# Patient Record
Sex: Female | Born: 1935 | Race: White | Hispanic: No | Smoking: Former smoker
Health system: Southern US, Community
[De-identification: ages and names within clinical notes are randomized; demographics above are authoritative.]

## PROBLEM LIST (undated history)

## (undated) DIAGNOSIS — F32A Depression, unspecified: Secondary | ICD-10-CM

## (undated) DIAGNOSIS — M1990 Unspecified osteoarthritis, unspecified site: Secondary | ICD-10-CM

## (undated) DIAGNOSIS — F329 Major depressive disorder, single episode, unspecified: Secondary | ICD-10-CM

## (undated) DIAGNOSIS — E785 Hyperlipidemia, unspecified: Secondary | ICD-10-CM

## (undated) DIAGNOSIS — I639 Cerebral infarction, unspecified: Secondary | ICD-10-CM

## (undated) DIAGNOSIS — I2699 Other pulmonary embolism without acute cor pulmonale: Secondary | ICD-10-CM

## (undated) DIAGNOSIS — I350 Nonrheumatic aortic (valve) stenosis: Secondary | ICD-10-CM

## (undated) DIAGNOSIS — I82409 Acute embolism and thrombosis of unspecified deep veins of unspecified lower extremity: Secondary | ICD-10-CM

## (undated) DIAGNOSIS — I10 Essential (primary) hypertension: Secondary | ICD-10-CM

## (undated) DIAGNOSIS — K219 Gastro-esophageal reflux disease without esophagitis: Secondary | ICD-10-CM

## (undated) HISTORY — DX: Hyperlipidemia, unspecified: E78.5

## (undated) HISTORY — DX: Gastro-esophageal reflux disease without esophagitis: K21.9

## (undated) HISTORY — PX: JOINT REPLACEMENT: SHX530

## (undated) HISTORY — DX: Unspecified osteoarthritis, unspecified site: M19.90

## (undated) HISTORY — DX: Major depressive disorder, single episode, unspecified: F32.9

## (undated) HISTORY — PX: ROTATOR CUFF REPAIR: SHX139

## (undated) HISTORY — DX: Depression, unspecified: F32.A

## (undated) HISTORY — DX: Nonrheumatic aortic (valve) stenosis: I35.0

## (undated) HISTORY — PX: FRACTURE SURGERY: SHX138

## (undated) HISTORY — PX: TEMPOROMANDIBULAR JOINT SURGERY: SHX35

## (undated) HISTORY — DX: Essential (primary) hypertension: I10

## (undated) HISTORY — DX: Cerebral infarction, unspecified: I63.9

## (undated) HISTORY — PX: ABDOMINAL HYSTERECTOMY: SHX81

---

## 1998-04-06 ENCOUNTER — Ambulatory Visit (HOSPITAL_COMMUNITY): Admission: RE | Admit: 1998-04-06 | Discharge: 1998-04-06 | Payer: Self-pay | Admitting: Family Medicine

## 1999-04-06 ENCOUNTER — Ambulatory Visit (HOSPITAL_COMMUNITY): Admission: RE | Admit: 1999-04-06 | Discharge: 1999-04-06 | Payer: Self-pay | Admitting: Family Medicine

## 1999-04-06 ENCOUNTER — Encounter: Payer: Self-pay | Admitting: Family Medicine

## 1999-08-12 ENCOUNTER — Emergency Department (HOSPITAL_COMMUNITY): Admission: EM | Admit: 1999-08-12 | Discharge: 1999-08-12 | Payer: Self-pay | Admitting: Emergency Medicine

## 1999-08-31 ENCOUNTER — Ambulatory Visit (HOSPITAL_COMMUNITY): Admission: RE | Admit: 1999-08-31 | Discharge: 1999-08-31 | Payer: Self-pay | Admitting: Gastroenterology

## 1999-08-31 ENCOUNTER — Encounter: Payer: Self-pay | Admitting: Gastroenterology

## 2000-02-01 ENCOUNTER — Ambulatory Visit (HOSPITAL_COMMUNITY): Admission: RE | Admit: 2000-02-01 | Discharge: 2000-02-01 | Payer: Self-pay | Admitting: Gastroenterology

## 2000-02-01 ENCOUNTER — Encounter (INDEPENDENT_AMBULATORY_CARE_PROVIDER_SITE_OTHER): Payer: Self-pay | Admitting: Specialist

## 2000-02-01 ENCOUNTER — Encounter: Payer: Self-pay | Admitting: Gastroenterology

## 2000-02-03 ENCOUNTER — Encounter: Payer: Self-pay | Admitting: Oral Surgery

## 2000-02-08 ENCOUNTER — Inpatient Hospital Stay (HOSPITAL_COMMUNITY): Admission: RE | Admit: 2000-02-08 | Discharge: 2000-02-09 | Payer: Self-pay | Admitting: Oral Surgery

## 2000-02-08 ENCOUNTER — Encounter (INDEPENDENT_AMBULATORY_CARE_PROVIDER_SITE_OTHER): Payer: Self-pay | Admitting: Specialist

## 2000-06-28 ENCOUNTER — Ambulatory Visit (HOSPITAL_COMMUNITY): Admission: RE | Admit: 2000-06-28 | Discharge: 2000-06-28 | Payer: Self-pay | Admitting: Family Medicine

## 2000-06-28 ENCOUNTER — Encounter: Payer: Self-pay | Admitting: Family Medicine

## 2000-09-24 ENCOUNTER — Encounter: Payer: Self-pay | Admitting: Orthopedic Surgery

## 2000-10-01 ENCOUNTER — Inpatient Hospital Stay (HOSPITAL_COMMUNITY): Admission: RE | Admit: 2000-10-01 | Discharge: 2000-10-05 | Payer: Self-pay | Admitting: Orthopedic Surgery

## 2000-10-04 ENCOUNTER — Encounter: Payer: Self-pay | Admitting: Orthopedic Surgery

## 2000-10-05 ENCOUNTER — Inpatient Hospital Stay (HOSPITAL_COMMUNITY)
Admission: RE | Admit: 2000-10-05 | Discharge: 2000-10-12 | Payer: Self-pay | Admitting: Physical Medicine & Rehabilitation

## 2000-10-22 ENCOUNTER — Encounter: Payer: Self-pay | Admitting: Internal Medicine

## 2000-10-22 ENCOUNTER — Inpatient Hospital Stay (HOSPITAL_COMMUNITY): Admission: EM | Admit: 2000-10-22 | Discharge: 2000-10-26 | Payer: Self-pay | Admitting: Emergency Medicine

## 2000-11-16 ENCOUNTER — Inpatient Hospital Stay (HOSPITAL_COMMUNITY): Admission: EM | Admit: 2000-11-16 | Discharge: 2000-12-01 | Payer: Self-pay | Admitting: *Deleted

## 2000-11-16 ENCOUNTER — Encounter (INDEPENDENT_AMBULATORY_CARE_PROVIDER_SITE_OTHER): Payer: Self-pay

## 2000-11-16 ENCOUNTER — Encounter: Payer: Self-pay | Admitting: *Deleted

## 2000-11-19 ENCOUNTER — Encounter: Payer: Self-pay | Admitting: Neurology

## 2000-11-19 ENCOUNTER — Encounter: Payer: Self-pay | Admitting: Family Medicine

## 2000-11-20 ENCOUNTER — Encounter: Payer: Self-pay | Admitting: Neurology

## 2000-11-22 ENCOUNTER — Encounter: Payer: Self-pay | Admitting: Internal Medicine

## 2000-11-23 ENCOUNTER — Encounter: Payer: Self-pay | Admitting: Internal Medicine

## 2001-10-02 ENCOUNTER — Ambulatory Visit (HOSPITAL_COMMUNITY): Admission: RE | Admit: 2001-10-02 | Discharge: 2001-10-02 | Payer: Self-pay | Admitting: Endocrinology

## 2001-10-02 ENCOUNTER — Encounter: Payer: Self-pay | Admitting: Endocrinology

## 2001-11-25 ENCOUNTER — Other Ambulatory Visit: Admission: RE | Admit: 2001-11-25 | Discharge: 2001-11-25 | Payer: Self-pay | Admitting: Internal Medicine

## 2002-10-09 ENCOUNTER — Encounter: Payer: Self-pay | Admitting: Endocrinology

## 2002-10-09 ENCOUNTER — Ambulatory Visit (HOSPITAL_COMMUNITY): Admission: RE | Admit: 2002-10-09 | Discharge: 2002-10-09 | Payer: Self-pay | Admitting: Endocrinology

## 2002-11-13 ENCOUNTER — Other Ambulatory Visit: Admission: RE | Admit: 2002-11-13 | Discharge: 2002-11-13 | Payer: Self-pay | Admitting: Endocrinology

## 2003-10-26 ENCOUNTER — Ambulatory Visit (HOSPITAL_COMMUNITY): Admission: RE | Admit: 2003-10-26 | Discharge: 2003-10-26 | Payer: Self-pay | Admitting: Internal Medicine

## 2004-01-07 ENCOUNTER — Inpatient Hospital Stay (HOSPITAL_COMMUNITY): Admission: RE | Admit: 2004-01-07 | Discharge: 2004-01-12 | Payer: Self-pay | Admitting: Orthopedic Surgery

## 2004-11-02 ENCOUNTER — Ambulatory Visit: Payer: Self-pay | Admitting: Internal Medicine

## 2004-12-30 ENCOUNTER — Ambulatory Visit: Payer: Self-pay | Admitting: Internal Medicine

## 2005-01-04 ENCOUNTER — Ambulatory Visit (HOSPITAL_COMMUNITY): Admission: RE | Admit: 2005-01-04 | Discharge: 2005-01-04 | Payer: Self-pay | Admitting: Internal Medicine

## 2005-02-07 ENCOUNTER — Ambulatory Visit: Payer: Self-pay | Admitting: Internal Medicine

## 2005-02-08 ENCOUNTER — Encounter: Admission: RE | Admit: 2005-02-08 | Discharge: 2005-02-08 | Payer: Self-pay | Admitting: Internal Medicine

## 2005-08-15 ENCOUNTER — Ambulatory Visit: Payer: Self-pay | Admitting: Internal Medicine

## 2005-09-22 ENCOUNTER — Ambulatory Visit (HOSPITAL_BASED_OUTPATIENT_CLINIC_OR_DEPARTMENT_OTHER): Admission: RE | Admit: 2005-09-22 | Discharge: 2005-09-22 | Payer: Self-pay | Admitting: Internal Medicine

## 2005-09-24 ENCOUNTER — Ambulatory Visit: Payer: Self-pay | Admitting: Internal Medicine

## 2005-10-03 ENCOUNTER — Ambulatory Visit: Payer: Self-pay | Admitting: Internal Medicine

## 2005-11-14 ENCOUNTER — Ambulatory Visit: Payer: Self-pay | Admitting: Internal Medicine

## 2005-12-18 ENCOUNTER — Encounter: Admission: RE | Admit: 2005-12-18 | Discharge: 2006-03-18 | Payer: Self-pay | Admitting: Internal Medicine

## 2006-02-04 ENCOUNTER — Encounter: Admission: RE | Admit: 2006-02-04 | Discharge: 2006-02-04 | Payer: Self-pay | Admitting: Orthopedic Surgery

## 2006-04-10 ENCOUNTER — Ambulatory Visit (HOSPITAL_COMMUNITY): Admission: RE | Admit: 2006-04-10 | Discharge: 2006-04-10 | Payer: Self-pay | Admitting: Internal Medicine

## 2006-05-03 ENCOUNTER — Ambulatory Visit (HOSPITAL_COMMUNITY): Admission: RE | Admit: 2006-05-03 | Discharge: 2006-05-03 | Payer: Self-pay | Admitting: Orthopedic Surgery

## 2006-06-28 ENCOUNTER — Ambulatory Visit (HOSPITAL_COMMUNITY): Admission: RE | Admit: 2006-06-28 | Discharge: 2006-06-29 | Payer: Self-pay | Admitting: Orthopedic Surgery

## 2006-07-07 ENCOUNTER — Emergency Department (HOSPITAL_COMMUNITY): Admission: EM | Admit: 2006-07-07 | Discharge: 2006-07-07 | Payer: Self-pay | Admitting: *Deleted

## 2006-07-16 ENCOUNTER — Ambulatory Visit: Payer: Self-pay | Admitting: Internal Medicine

## 2006-08-06 ENCOUNTER — Encounter: Payer: Self-pay | Admitting: Cardiology

## 2006-08-06 ENCOUNTER — Ambulatory Visit: Payer: Self-pay

## 2006-08-14 ENCOUNTER — Ambulatory Visit: Payer: Self-pay | Admitting: Internal Medicine

## 2006-09-11 ENCOUNTER — Ambulatory Visit: Payer: Self-pay | Admitting: Internal Medicine

## 2006-09-17 ENCOUNTER — Ambulatory Visit: Payer: Self-pay | Admitting: Internal Medicine

## 2006-10-15 ENCOUNTER — Ambulatory Visit: Payer: Self-pay | Admitting: Internal Medicine

## 2006-10-15 LAB — CONVERTED CEMR LAB
ALT: 14 units/L (ref 0–40)
AST: 21 units/L (ref 0–37)
Albumin: 3.4 g/dL — ABNORMAL LOW (ref 3.5–5.2)
Alkaline Phosphatase: 66 units/L (ref 39–117)
BUN: 21 mg/dL (ref 6–23)
Bilirubin, Direct: 0.2 mg/dL (ref 0.0–0.3)
CO2: 30 meq/L (ref 19–32)
Calcium: 9.5 mg/dL (ref 8.4–10.5)
Chloride: 102 meq/L (ref 96–112)
Chol/HDL Ratio, serum: 2.4
Cholesterol: 174 mg/dL (ref 0–200)
Creatinine, Ser: 1.3 mg/dL — ABNORMAL HIGH (ref 0.4–1.2)
GFR calc non Af Amer: 43 mL/min
Glomerular Filtration Rate, Af Am: 52 mL/min/{1.73_m2}
Glucose, Bld: 117 mg/dL — ABNORMAL HIGH (ref 70–99)
HDL: 72.8 mg/dL (ref >39.0)
Hgb A1c MFr Bld: 5.7 % (ref 4.6–6.0)
LDL Cholesterol: 78 mg/dL (ref 0–99)
Potassium: 4.3 meq/L (ref 3.5–5.1)
Sodium: 140 meq/L (ref 135–145)
Total Bilirubin: 0.5 mg/dL (ref 0.3–1.2)
Total Protein: 6.2 g/dL (ref 6.0–8.3)
Triglyceride fasting, serum: 118 mg/dL (ref 0–149)
VLDL: 24 mg/dL (ref 0–40)

## 2006-10-21 DIAGNOSIS — K219 Gastro-esophageal reflux disease without esophagitis: Secondary | ICD-10-CM

## 2006-10-21 DIAGNOSIS — Z8679 Personal history of other diseases of the circulatory system: Secondary | ICD-10-CM

## 2006-10-21 DIAGNOSIS — F339 Major depressive disorder, recurrent, unspecified: Secondary | ICD-10-CM

## 2006-10-21 DIAGNOSIS — I10 Essential (primary) hypertension: Secondary | ICD-10-CM | POA: Insufficient documentation

## 2006-10-21 DIAGNOSIS — E785 Hyperlipidemia, unspecified: Secondary | ICD-10-CM

## 2006-11-12 ENCOUNTER — Emergency Department (HOSPITAL_COMMUNITY): Admission: EM | Admit: 2006-11-12 | Discharge: 2006-11-12 | Payer: Self-pay | Admitting: Emergency Medicine

## 2006-11-16 ENCOUNTER — Ambulatory Visit: Payer: Self-pay | Admitting: Internal Medicine

## 2006-12-01 ENCOUNTER — Emergency Department (HOSPITAL_COMMUNITY): Admission: EM | Admit: 2006-12-01 | Discharge: 2006-12-01 | Payer: Self-pay | Admitting: Emergency Medicine

## 2006-12-03 ENCOUNTER — Ambulatory Visit: Payer: Self-pay | Admitting: Internal Medicine

## 2006-12-28 ENCOUNTER — Ambulatory Visit: Payer: Self-pay | Admitting: Internal Medicine

## 2007-02-04 ENCOUNTER — Encounter: Admission: RE | Admit: 2007-02-04 | Discharge: 2007-02-20 | Payer: Self-pay | Admitting: Internal Medicine

## 2007-04-29 ENCOUNTER — Ambulatory Visit (HOSPITAL_COMMUNITY): Admission: RE | Admit: 2007-04-29 | Discharge: 2007-04-29 | Payer: Self-pay | Admitting: Specialist

## 2007-04-29 ENCOUNTER — Encounter (INDEPENDENT_AMBULATORY_CARE_PROVIDER_SITE_OTHER): Payer: Self-pay | Admitting: Specialist

## 2007-07-30 ENCOUNTER — Ambulatory Visit: Payer: Self-pay | Admitting: Internal Medicine

## 2007-07-30 DIAGNOSIS — R42 Dizziness and giddiness: Secondary | ICD-10-CM

## 2007-07-31 ENCOUNTER — Telehealth: Payer: Self-pay | Admitting: Internal Medicine

## 2007-08-14 DIAGNOSIS — M1990 Unspecified osteoarthritis, unspecified site: Secondary | ICD-10-CM

## 2007-08-14 DIAGNOSIS — I359 Nonrheumatic aortic valve disorder, unspecified: Secondary | ICD-10-CM | POA: Insufficient documentation

## 2007-08-21 ENCOUNTER — Ambulatory Visit: Payer: Self-pay | Admitting: Internal Medicine

## 2007-08-21 LAB — CONVERTED CEMR LAB
ALT: 17 units/L (ref 0–35)
AST: 22 units/L (ref 0–37)
Albumin: 4 g/dL (ref 3.5–5.2)
Alkaline Phosphatase: 73 units/L (ref 39–117)
BUN: 18 mg/dL (ref 6–23)
Basophils Absolute: 0.1 10*3/uL (ref 0.0–0.1)
Basophils Relative: 0.9 % (ref 0.0–1.0)
Bilirubin Urine: NEGATIVE
Bilirubin, Direct: 0.1 mg/dL (ref 0.0–0.3)
Blood in Urine, dipstick: NEGATIVE
CO2: 31 meq/L (ref 19–32)
Calcium: 9.9 mg/dL (ref 8.4–10.5)
Chloride: 103 meq/L (ref 96–112)
Cholesterol: 160 mg/dL (ref 0–200)
Creatinine, Ser: 1.2 mg/dL (ref 0.4–1.2)
Creatinine,U: 86.6 mg/dL
Eosinophils Absolute: 0.1 10*3/uL (ref 0.0–0.6)
Eosinophils Relative: 1 % (ref 0.0–5.0)
GFR calc Af Amer: 57 mL/min
GFR calc non Af Amer: 47 mL/min
Glucose, Bld: 119 mg/dL — ABNORMAL HIGH (ref 70–99)
Glucose, Urine, Semiquant: NEGATIVE
HCT: 38.4 % (ref 36.0–46.0)
HDL: 63.1 mg/dL (ref >39.0)
Hemoglobin: 13 g/dL (ref 12.0–15.0)
Hgb A1c MFr Bld: 6 % (ref 4.6–6.0)
Ketones, urine, test strip: NEGATIVE
LDL Cholesterol: 77 mg/dL (ref 0–99)
Lymphocytes Relative: 25.3 % (ref 12.0–46.0)
MCHC: 33.9 g/dL (ref 30.0–36.0)
MCV: 93.6 fL (ref 78.0–100.0)
Microalb Creat Ratio: 9.2 mg/g (ref 0.0–30.0)
Microalb, Ur: 0.8 mg/dL (ref 0.0–1.9)
Monocytes Absolute: 0.5 10*3/uL (ref 0.2–0.7)
Monocytes Relative: 8.5 % (ref 3.0–11.0)
Neutro Abs: 3.6 10*3/uL (ref 1.4–7.7)
Neutrophils Relative %: 64.3 % (ref 43.0–77.0)
Nitrite: NEGATIVE
Platelets: 224 10*3/uL (ref 150–400)
Potassium: 4.9 meq/L (ref 3.5–5.1)
Protein, U semiquant: NEGATIVE
RBC: 4.1 M/uL (ref 3.87–5.11)
RDW: 12.5 % (ref 11.5–14.6)
Sodium: 140 meq/L (ref 135–145)
Specific Gravity, Urine: 1.02
TSH: 1.73 microintl units/mL (ref 0.35–5.50)
Total Bilirubin: 0.6 mg/dL (ref 0.3–1.2)
Total CHOL/HDL Ratio: 2.5
Total Protein: 6.5 g/dL (ref 6.0–8.3)
Triglycerides: 102 mg/dL (ref 0–149)
Urobilinogen, UA: NEGATIVE
VLDL: 20 mg/dL (ref 0–40)
WBC: 5.8 10*3/uL (ref 4.5–10.5)
pH: 5.5

## 2007-08-27 ENCOUNTER — Ambulatory Visit: Payer: Self-pay | Admitting: Internal Medicine

## 2007-08-28 ENCOUNTER — Encounter: Payer: Self-pay | Admitting: Internal Medicine

## 2007-11-01 ENCOUNTER — Ambulatory Visit: Payer: Self-pay | Admitting: Internal Medicine

## 2007-11-01 DIAGNOSIS — F411 Generalized anxiety disorder: Secondary | ICD-10-CM

## 2007-11-11 ENCOUNTER — Telehealth: Payer: Self-pay | Admitting: Internal Medicine

## 2007-12-23 ENCOUNTER — Ambulatory Visit: Payer: Self-pay | Admitting: Internal Medicine

## 2007-12-23 LAB — CONVERTED CEMR LAB
ALT: 17 units/L (ref 0–35)
Alkaline Phosphatase: 65 units/L (ref 39–117)
BUN: 16 mg/dL (ref 6–23)
Bilirubin, Direct: 0.2 mg/dL (ref 0.0–0.3)
Calcium: 9.9 mg/dL (ref 8.4–10.5)
Cholesterol: 166 mg/dL (ref 0–200)
Creatinine, Ser: 1.2 mg/dL (ref 0.4–1.2)
GFR calc Af Amer: 57 mL/min
Glucose, Bld: 125 mg/dL — ABNORMAL HIGH (ref 70–99)
Hgb A1c MFr Bld: 6 % (ref 4.6–6.0)
Potassium: 4.8 meq/L (ref 3.5–5.1)
Total Bilirubin: 0.9 mg/dL (ref 0.3–1.2)
Total CHOL/HDL Ratio: 2.6

## 2007-12-27 ENCOUNTER — Ambulatory Visit: Payer: Self-pay | Admitting: Internal Medicine

## 2008-03-06 ENCOUNTER — Ambulatory Visit: Payer: Self-pay | Admitting: Internal Medicine

## 2008-03-09 ENCOUNTER — Telehealth (INDEPENDENT_AMBULATORY_CARE_PROVIDER_SITE_OTHER): Payer: Self-pay | Admitting: *Deleted

## 2008-03-19 ENCOUNTER — Ambulatory Visit (HOSPITAL_COMMUNITY): Admission: RE | Admit: 2008-03-19 | Discharge: 2008-03-20 | Payer: Self-pay | Admitting: Orthopedic Surgery

## 2008-03-22 ENCOUNTER — Ambulatory Visit: Payer: Self-pay | Admitting: Internal Medicine

## 2008-03-22 ENCOUNTER — Inpatient Hospital Stay (HOSPITAL_COMMUNITY): Admission: EM | Admit: 2008-03-22 | Discharge: 2008-03-24 | Payer: Self-pay | Admitting: Emergency Medicine

## 2008-03-30 ENCOUNTER — Telehealth: Payer: Self-pay | Admitting: Internal Medicine

## 2008-04-07 ENCOUNTER — Ambulatory Visit: Payer: Self-pay | Admitting: Internal Medicine

## 2008-04-09 ENCOUNTER — Encounter: Payer: Self-pay | Admitting: Internal Medicine

## 2008-04-09 ENCOUNTER — Ambulatory Visit: Payer: Self-pay

## 2008-05-25 ENCOUNTER — Ambulatory Visit: Payer: Self-pay | Admitting: Internal Medicine

## 2008-06-09 ENCOUNTER — Ambulatory Visit: Payer: Self-pay | Admitting: Internal Medicine

## 2008-06-15 ENCOUNTER — Encounter: Admission: RE | Admit: 2008-06-15 | Discharge: 2008-06-15 | Payer: Self-pay | Admitting: Internal Medicine

## 2008-06-25 ENCOUNTER — Ambulatory Visit: Payer: Self-pay | Admitting: Internal Medicine

## 2008-06-25 LAB — CONVERTED CEMR LAB
ALT: 20 units/L (ref 0–35)
AST: 23 units/L (ref 0–37)
Albumin: 3.7 g/dL (ref 3.5–5.2)
Calcium: 9.8 mg/dL (ref 8.4–10.5)
Cholesterol: 156 mg/dL (ref 0–200)
GFR calc non Af Amer: 52 mL/min
Hgb A1c MFr Bld: 6.5 % — ABNORMAL HIGH (ref 4.6–6.0)
LDL Cholesterol: 72 mg/dL (ref 0–99)
Potassium: 4.5 meq/L (ref 3.5–5.1)
Total Bilirubin: 0.8 mg/dL (ref 0.3–1.2)
VLDL: 22 mg/dL (ref 0–40)

## 2008-07-07 ENCOUNTER — Telehealth: Payer: Self-pay | Admitting: *Deleted

## 2008-08-04 ENCOUNTER — Telehealth: Payer: Self-pay | Admitting: Internal Medicine

## 2008-08-18 ENCOUNTER — Ambulatory Visit (HOSPITAL_COMMUNITY): Admission: RE | Admit: 2008-08-18 | Discharge: 2008-08-18 | Payer: Self-pay | Admitting: Internal Medicine

## 2008-09-04 ENCOUNTER — Telehealth: Payer: Self-pay | Admitting: Internal Medicine

## 2009-03-25 ENCOUNTER — Telehealth: Payer: Self-pay | Admitting: Internal Medicine

## 2009-05-03 ENCOUNTER — Telehealth: Payer: Self-pay | Admitting: Internal Medicine

## 2009-05-11 LAB — HM DIABETES EYE EXAM: HM Diabetic Eye Exam: NORMAL

## 2009-07-21 ENCOUNTER — Telehealth: Payer: Self-pay | Admitting: Internal Medicine

## 2009-08-11 ENCOUNTER — Ambulatory Visit: Payer: Self-pay | Admitting: Internal Medicine

## 2009-08-12 LAB — CONVERTED CEMR LAB
ALT: 15 units/L (ref 0–35)
AST: 24 units/L (ref 0–37)
BUN: 14 mg/dL (ref 6–23)
Bilirubin, Direct: 0 mg/dL (ref 0.0–0.3)
CO2: 32 meq/L (ref 19–32)
Chloride: 103 meq/L (ref 96–112)
Cholesterol: 133 mg/dL (ref 0–200)
GFR calc non Af Amer: 57.69 mL/min (ref >60)
HDL: 53.5 mg/dL (ref >39.00)
Hgb A1c MFr Bld: 6.1 % (ref 4.6–6.5)
LDL Cholesterol: 56 mg/dL (ref 0–99)
Sodium: 142 meq/L (ref 135–145)
Total CHOL/HDL Ratio: 2

## 2009-08-13 ENCOUNTER — Telehealth: Payer: Self-pay | Admitting: Internal Medicine

## 2009-08-27 ENCOUNTER — Ambulatory Visit (HOSPITAL_COMMUNITY): Admission: RE | Admit: 2009-08-27 | Discharge: 2009-08-27 | Payer: Self-pay | Admitting: Internal Medicine

## 2009-09-01 ENCOUNTER — Encounter: Payer: Self-pay | Admitting: Internal Medicine

## 2009-10-14 ENCOUNTER — Telehealth: Payer: Self-pay | Admitting: Internal Medicine

## 2009-10-18 ENCOUNTER — Encounter (INDEPENDENT_AMBULATORY_CARE_PROVIDER_SITE_OTHER): Payer: Self-pay | Admitting: *Deleted

## 2009-10-29 ENCOUNTER — Telehealth: Payer: Self-pay | Admitting: Internal Medicine

## 2009-11-30 ENCOUNTER — Telehealth: Payer: Self-pay | Admitting: Internal Medicine

## 2010-01-06 ENCOUNTER — Telehealth: Payer: Self-pay | Admitting: Internal Medicine

## 2010-02-28 ENCOUNTER — Telehealth: Payer: Self-pay | Admitting: Internal Medicine

## 2010-04-13 ENCOUNTER — Telehealth: Payer: Self-pay | Admitting: Internal Medicine

## 2010-08-08 ENCOUNTER — Ambulatory Visit: Payer: Self-pay | Admitting: Internal Medicine

## 2010-08-11 LAB — CONVERTED CEMR LAB
ALT: 16 units/L (ref 0–35)
BUN: 12 mg/dL (ref 6–23)
CO2: 32 meq/L (ref 19–32)
Calcium: 10.5 mg/dL (ref 8.4–10.5)
Glucose, Bld: 208 mg/dL — ABNORMAL HIGH (ref 70–99)
HDL: 49.5 mg/dL (ref >39.00)
Microalb Creat Ratio: 3.5 mg/g (ref 0.0–30.0)
Sodium: 139 meq/L (ref 135–145)
Total CHOL/HDL Ratio: 3

## 2010-08-29 ENCOUNTER — Telehealth: Payer: Self-pay | Admitting: Internal Medicine

## 2010-08-31 ENCOUNTER — Ambulatory Visit (HOSPITAL_COMMUNITY): Admission: RE | Admit: 2010-08-31 | Discharge: 2010-08-31 | Payer: Self-pay | Admitting: Internal Medicine

## 2010-09-19 ENCOUNTER — Telehealth: Payer: Self-pay | Admitting: Internal Medicine

## 2010-10-03 ENCOUNTER — Ambulatory Visit: Payer: Self-pay | Admitting: Internal Medicine

## 2010-11-16 ENCOUNTER — Ambulatory Visit: Payer: Self-pay | Admitting: Internal Medicine

## 2010-12-28 ENCOUNTER — Other Ambulatory Visit: Payer: Self-pay | Admitting: Internal Medicine

## 2010-12-28 ENCOUNTER — Ambulatory Visit
Admission: RE | Admit: 2010-12-28 | Discharge: 2010-12-28 | Payer: Self-pay | Source: Home / Self Care | Attending: Internal Medicine | Admitting: Internal Medicine

## 2010-12-28 LAB — HEMOGLOBIN A1C: Hgb A1c MFr Bld: 6.5 % (ref 4.6–6.5)

## 2010-12-28 LAB — LIPID PANEL
Cholesterol: 136 mg/dL (ref 0–200)
HDL: 48.2 mg/dL (ref >39.00)
LDL Cholesterol: 66 mg/dL (ref 0–99)
Total CHOL/HDL Ratio: 3
Triglycerides: 108 mg/dL (ref 0.0–149.0)
VLDL: 21.6 mg/dL (ref 0.0–40.0)

## 2010-12-28 LAB — BASIC METABOLIC PANEL
BUN: 16 mg/dL (ref 6–23)
CO2: 30 mEq/L (ref 19–32)
Calcium: 9.9 mg/dL (ref 8.4–10.5)
Chloride: 102 mEq/L (ref 96–112)
Creatinine, Ser: 1.5 mg/dL — ABNORMAL HIGH (ref 0.4–1.2)
GFR: 35.45 mL/min — ABNORMAL LOW (ref >60.00)
Glucose, Bld: 139 mg/dL — ABNORMAL HIGH (ref 70–99)
Potassium: 4.9 mEq/L (ref 3.5–5.1)
Sodium: 139 mEq/L (ref 135–145)

## 2010-12-28 LAB — HEPATIC FUNCTION PANEL
ALT: 19 U/L (ref 0–35)
AST: 23 U/L (ref 0–37)
Albumin: 4 g/dL (ref 3.5–5.2)
Alkaline Phosphatase: 68 U/L (ref 39–117)
Bilirubin, Direct: 0.2 mg/dL (ref 0.0–0.3)
Total Bilirubin: 0.8 mg/dL (ref 0.3–1.2)
Total Protein: 6.7 g/dL (ref 6.0–8.3)

## 2011-01-01 ENCOUNTER — Encounter: Payer: Self-pay | Admitting: Internal Medicine

## 2011-01-02 ENCOUNTER — Ambulatory Visit
Admission: RE | Admit: 2011-01-02 | Discharge: 2011-01-02 | Payer: Self-pay | Source: Home / Self Care | Attending: Internal Medicine | Admitting: Internal Medicine

## 2011-01-07 ENCOUNTER — Encounter: Payer: Self-pay | Admitting: Internal Medicine

## 2011-01-10 NOTE — Assessment & Plan Note (Signed)
Summary: 1 MONTH FUP/CJR   Vital Signs:  Patient profile:   77 year old female Weight:      211 pounds Temp:     98.5 degrees F oral Pulse rate:   92 / minute BP sitting:   128 / 78  (left arm) Cuff size:   regular  Vitals Entered By: Alfred Levins, CMA (October 03, 2010 11:53 AM) CC: f/u   CC:  f/u.  History of Present Illness: she feels much better tolerating meds without difficulty  Follow-Up Visit      This is a 77 year old woman who presents for Follow-up visit.  The patient denies chest pain and palpitations.  Since the last visit the patient notes no new problems or concerns.  The patient reports taking meds as prescribed.  When questioned about possible medication side effects, the patient notes none.    Current Problems (verified): 1)  Other Anxiety States  (ICD-300.09) 2)  Physical Examination  (ICD-V70.0) 3)  Aortic Stenosis, Mild  (ICD-424.1) 4)  Osteoarthritis  (ICD-715.90) 5)  Vertigo  (ICD-780.4) 6)  Cerebrovascular Accident, Hx of  (ICD-V12.50) 7)  Hypertension  (ICD-401.9) 8)  Hyperlipidemia  (ICD-272.4) 9)  Gerd  (ICD-530.81) 10)  Diabetes Mellitus, Type II  (ICD-250.00) 11)  Depression  (ICD-311)  Current Medications (verified): 1)  Furosemide 20 Mg Tabs (Furosemide) .... Take 1 Capsule By Mouth Once A Day 2)  Imipramine Hcl 25 Mg Tabs (Imipramine Hcl) .... Take 1 Tablet By Mouth Once A Day 3)  Labetalol Hcl 300 Mg Tabs (Labetalol Hcl) .... Take 1 Tablet By Mouth Once A Day 4)  Nexium 40 Mg Cpdr (Esomeprazole Magnesium) .... Take 1 Capsule By Mouth Once A Day 5)  Simvastatin 20 Mg Tabs (Simvastatin) .... Take 1 Tablet By Mouth At Bedtime 6)  Verelan Pm 300 Mg Cp24 (Verapamil Hcl) .... Take 1 Capsule By Mouth Once A Day 7)  Klor-Con M20 20 Meq  Tbcr (Potassium Chloride Crys Cr) .... Take 1 Tablet By Mouth Once A Day 8)  Multivitamins   Tabs (Multiple Vitamin) .... One By Mouth Daily 9)  Aspirin 325 Mg  Tbec (Aspirin) .... Once Daily 10)   Vitamin B-12 Cr 1000 Mcg  Tbcr (Cyanocobalamin) .... Once Daily 11)  Nova Max Glucose Test  Strp (Glucose Blood) .... Use Daily As Directed 12)  Citalopram Hydrobromide 20 Mg Tabs (Citalopram Hydrobromide) .... Take One Tablet By Mouth Daily  Allergies (verified): 1)  ! Hydrochlorothiazide (Hydrochlorothiazide) 2)  Micardis Hct (Telmisartan-Hctz) 3)  Altace (Ramipril) 4)  * Tape  Past History:  Past Medical History: Last updated: 08/14/2007 Depression Diabetes mellitus, type II GERD Hyperlipidemia Hypertension Cerebrovascular accident, hx of aortic stenosis, mild Osteoarthritis  Past Surgical History: Last updated: 08/14/2007 Hysterectomy, fibroids-no ca Total knee replacement, R and L rotator cuff surgery TMJ surgery L leg fx  Social History: Last updated: 07/30/2007 Married Alcohol use-no Regular exercise-no  Risk Factors: Exercise: no (07/30/2007)  Risk Factors: Smoking Status: quit (08/08/2010)  Physical Exam  General:  alert and well-developed.   Head:  normocephalic and atraumatic.   Ears:  R ear normal and L ear normal.   Neck:  No deformities, masses, or tenderness noted. Chest Wall:  No deformities, masses, or tenderness noted. Lungs:  normal respiratory effort and no intercostal retractions.   Heart:  normal rate and regular rhythm.   Abdomen:  soft and non-tender.   Skin:  turgor normal and color normal.   Cervical Nodes:  no  anterior cervical adenopathy and no posterior cervical adenopathy.     Impression & Recommendations:  Problem # 1:  OTHER ANXIETY STATES (ICD-300.09) tolerating meds  continue same meds see me 4 months Her updated medication list for this problem includes:    Imipramine Hcl 25 Mg Tabs (Imipramine hcl) .Marland Kitchen... Take 1 tablet by mouth once a day    Citalopram Hydrobromide 20 Mg Tabs (Citalopram hydrobromide) .Marland Kitchen... Take one tablet by mouth daily  Problem # 2:  HYPERTENSION (ICD-401.9) controlled continue current  medications  Her updated medication list for this problem includes:    Furosemide 20 Mg Tabs (Furosemide) .Marland Kitchen... Take 1 capsule by mouth once a day    Labetalol Hcl 300 Mg Tabs (Labetalol hcl) .Marland Kitchen... Take 1 tablet by mouth once a day    Verelan Pm 300 Mg Cp24 (Verapamil hcl) .Marland Kitchen... Take 1 capsule by mouth once a day  BP today: 128/78 Prior BP: 102/66 (08/08/2010)  Labs Reviewed: K+: 4.9 (08/08/2010) Creat: : 1.4 (08/08/2010)   Chol: 145 (08/08/2010)   HDL: 49.50 (08/08/2010)   LDL: 58 (08/08/2010)   TG: 189.0 (08/08/2010)  Problem # 3:  HYPERLIPIDEMIA (ICD-272.4) controlled continue current medications  Her updated medication list for this problem includes:    Simvastatin 20 Mg Tabs (Simvastatin) .Marland Kitchen... Take 1 tablet by mouth at bedtime  Labs Reviewed: SGOT: 24 (08/11/2009)   SGPT: 16 (08/08/2010)   HDL:49.50 (08/08/2010), 53.50 (08/11/2009)  LDL:58 (08/08/2010), 56 (08/11/2009)  Chol:145 (08/08/2010), 133 (08/11/2009)  Trig:189.0 (08/08/2010), 120.0 (08/11/2009)  Complete Medication List: 1)  Furosemide 20 Mg Tabs (Furosemide) .... Take 1 capsule by mouth once a day 2)  Imipramine Hcl 25 Mg Tabs (Imipramine hcl) .... Take 1 tablet by mouth once a day 3)  Labetalol Hcl 300 Mg Tabs (Labetalol hcl) .... Take 1 tablet by mouth once a day 4)  Nexium 40 Mg Cpdr (Esomeprazole magnesium) .... Take 1 capsule by mouth once a day 5)  Simvastatin 20 Mg Tabs (Simvastatin) .... Take 1 tablet by mouth at bedtime 6)  Verelan Pm 300 Mg Cp24 (Verapamil hcl) .... Take 1 capsule by mouth once a day 7)  Klor-con M20 20 Meq Tbcr (Potassium chloride crys cr) .... Take 1 tablet by mouth once a day 8)  Multivitamins Tabs (Multiple vitamin) .... One by mouth daily 9)  Aspirin 325 Mg Tbec (Aspirin) .... Once daily 10)  Vitamin B-12 Cr 1000 Mcg Tbcr (Cyanocobalamin) .... Once daily 11)  Nova Max Glucose Test Strp (Glucose blood) .... Use daily as directed 12)  Citalopram Hydrobromide 20 Mg Tabs (Citalopram  hydrobromide) .... Take one tablet by mouth daily  Other Orders: Orthopedic Referral (Ortho)  Patient Instructions: 1)  Please schedule a follow-up appointment in 4 months. 2)  labs one week prior to visit 3)  lipids---272.4 4)  lfts-995.2 5)  bmet-995.2 6)  A1C-250.02 7)     Prescriptions: CITALOPRAM HYDROBROMIDE 20 MG TABS (CITALOPRAM HYDROBROMIDE) take one tablet by mouth daily  #90 x 3   Entered and Authorized by:   Birdie Sons MD   Signed by:   Birdie Sons MD on 10/03/2010   Method used:   [facility]        [facility] [phone]* (retail)       [address]       [place], [place]  [address]       Ph: [phone]       Fax: [phone]  RxID:   [id number] KLOR-CON M20 20 MEQ  TBCR (POTASSIUM CHLORIDE CRYS CR) Take 1 tablet by mouth once a day  #90 x 3   Entered and Authorized by:   Birdie Sons MD   Signed by:   Birdie Sons MD on 10/03/2010   Method used:   [facility]        [facility] [address] [phone]* (retail)       [address]       [place], [place]  [address]       Ph: [phone]       Fax: [phone]   RxID:   [id number] VERELAN PM 300 MG CP24 (VERAPAMIL HCL) Take 1 capsule by mouth once a day  #90 x 3   Entered and Authorized by:   Birdie Sons MD   Signed by:   Birdie Sons MD on 10/03/2010   Method used:   [facility]        [facility] [address] [phone]* (retail)       [address]       [place], [place]  [address]       Ph: [phone]       Fax: [phone]   RxID:   [id number] SIMVASTATIN 20 MG TABS (SIMVASTATIN) Take 1 tablet by mouth at bedtime  #90 x 3   Entered and Authorized by:   Birdie Sons MD   Signed by:   Birdie Sons MD on 10/03/2010   Method used:   [facility]ically [facility]        [facility] [phone]* (retail)       [address]       [place], [place]  [address]       Ph: [phone]       Fax:  [phone]   RxID:   [id number] NEXIUM 40 MG CPDR (ESOMEPRAZOLE MAGNESIUM) Take 1 capsule by mouth once a day  #90 x 3   Entered and Authorized by:   Birdie Sons MD   Signed by:   Birdie Sons MD on 10/03/2010   Method used:   [facility]        [facility] [address] [phone]* (retail)       [address]       [place], [place]  [address]       Ph: [phone]       Fax: [phone]   RxID:   [id number] LABETALOL HCL 300 MG TABS (LABETALOL HCL) Take 1 tablet by mouth once a day  #90 x 3   Entered and Authorized by:   Birdie Sons MD   Signed by:   Birdie Sons MD on 10/03/2010   Method used:   [facility]        [facility] [phone]* (retail)       [address]       [place], [place]  [address]       Ph: [phone]       Fax: [phone]   RxID:   [id number] IMIPRAMINE HCL 25 MG TABS (IMIPRAMINE HCL) Take 1 tablet by mouth once a day  #90 x 3   Entered and Authorized by:   Birdie Sons MD  Signed by:   Birdie Sons MD on 10/03/2010   Method used:   [facility]        [facility] [address] #[phone]* (retail)       [address]       [place], [place]  [address]       Ph: [phone]       Fax: [phone]   RxID:   [id number] FUROSEMIDE 20 MG TABS (FUROSEMIDE) Take 1 capsule by mouth once a day  #90 x 3   Entered and Authorized by:   Birdie Sons MD   Signed by:   Birdie Sons MD on 10/03/2010   Method used:   [facility]        [facility] [phone]* (retail)       [address]       [place], [place]  [address]       Ph: [phone]       Fax: [phone]   RxID:   [id number]    Orders Added: 1)  Est. Patient Level IV [[address]] 2)  Orthopedic Referral [Ortho]

## 2011-01-10 NOTE — Progress Notes (Signed)
Summary: labetalol,simvastatin,imipramine  Phone Note Call from Patient Call back at Home Phone [phone]   Caller: Patient Call For: Birdie Sons MD Summary of Call: Please  call [facility] pt needs labetalol 300 MG, simvastatin 20 mg and impramine 25 mg new rx's Initial call taken by: Heron Sabins,  January 06, 2010 3:52 PM  Follow-up for Phone Call        see Rx    Prescriptions: SIMVASTATIN 20 MG TABS (SIMVASTATIN) Take 1 tablet by mouth at bedtime  #90 x 3   Entered by:   Gladis Riffle, RN   Authorized by:   Birdie Sons MD   Signed by:   Gladis Riffle, RN on 01/06/2010   Method used:   [facility]        [facility] [phone]* (retail)       [address]       [place], [place]  [address]       Ph: [phone]       Fax: [phone]   RxID:   [id number] LABETALOL HCL 300 MG TABS (LABETALOL HCL) Take 1 tablet by mouth twice a day  #180 x 3   Entered by:   Gladis Riffle, RN   Authorized by:   Birdie Sons MD   Signed by:   Gladis Riffle, RN on 01/06/2010   Method used:   [facility]        [facility] [phone]* (retail)       [address]       [place], [place]  [address]       Ph: [phone]       Fax: [phone]   RxID:   [id number] IMIPRAMINE HCL 25 MG TABS (IMIPRAMINE HCL) Take 1 tablet by mouth once a day  #90 x 3   Entered by:   Gladis Riffle, RN   Authorized by:   Birdie Sons MD   Signed by:   Gladis Riffle, RN on 01/06/2010   Method used:   [facility]        [facility] [phone]* (retail)       [address]       [place], [place]  [address]       Ph: [phone]       Fax: [phone]   RxID:   [id number]

## 2011-01-10 NOTE — Progress Notes (Signed)
Summary: furosemide  Phone Note Refill Request Message from:  Fax from Pharmacy on August 29, 2010 3:45 PM  Refills Requested: Medication #1:  FUROSEMIDE 20 MG TABS Take 1 capsule by mouth once a day Initial call taken by: Kern Reap CMA Duncan Dull),  August 29, 2010 3:45 PM    Prescriptions: FUROSEMIDE 20 MG TABS (FUROSEMIDE) Take 1 capsule by mouth once a day  #90 x 1   Entered by:   Kern Reap CMA (AAMA)   Authorized by:   Birdie Sons MD   Signed by:   Kern Reap CMA (AAMA) on 08/29/2010   Method used:   [facility]        [facility] [phone]* (retail)       [address]       [place], [place]  [address]       Ph: [phone]       Fax: [phone]   RxID:   [id number]

## 2011-01-10 NOTE — Progress Notes (Signed)
Summary: REQ FOR RX (Effexor)  Phone Note Call from Patient   Caller: Patient [phone] Reason for Call: Refill Medication Summary of Call: Pt called to req that a refill on med: Effexor Xr 75 Mg   [facility] Initial call taken by: Debbra Riding,  February 28, 2010 3:09 PM    Prescriptions: EFFEXOR XR 75 MG CP24 (VENLAFAXINE HCL) 2 by mouth once daily  #180 x 3   Entered by:   Kern Reap CMA (AAMA)   Authorized by:   Birdie Sons MD   Signed by:   Kern Reap CMA (AAMA) on 03/01/2010   Method used:   [facility]        [facility] [phone]* (retail)       [address]       [place], [place]  [address]       Ph: [phone]       Fax: [phone]   RxID:   [id number]

## 2011-01-10 NOTE — Assessment & Plan Note (Signed)
Summary: fu on dm/njr---PT [facility] // RS   Vital Signs:  Patient profile:   77 year old female Weight:      210 pounds Temp:     98.5 degrees F oral Pulse rate:   84 / minute Pulse rhythm:   regular Resp:     16 per minute BP sitting:   102 / 66  (left arm) Cuff size:   regular  Vitals Entered By: Gladis Riffle, RN (August 08, 2010 9:49 AM) CC: FU DM--CBGs not done at home--c/o dizzy spells at times that are relieved with food Is Patient Diabetic? Yes Did you bring your meter with you today? No   CC:  FU DM--CBGs not done at home--c/o dizzy spells at times that are relieved with food.  History of Present Illness: Depression:  Since husband died she stays in her home "all i do is eat, sleep and read". I don't care about things  noncompliant with f/u dm---CBGs elevated at home  lipids--tolerating meds  htn--taking meds  GERD---not taking nexium---having recurrent sxs  All other systems reviewed and were negative   Preventive Screening-Counseling & Management  Alcohol-Tobacco     Smoking Status: quit  Current Problems (verified): 1)  Other Anxiety States  (ICD-300.09) 2)  Physical Examination  (ICD-V70.0) 3)  Aortic Stenosis, Mild  (ICD-424.1) 4)  Osteoarthritis  (ICD-715.90) 5)  Vertigo  (ICD-780.4) 6)  Cerebrovascular Accident, Hx of  (ICD-V12.50) 7)  Hypertension  (ICD-401.9) 8)  Hyperlipidemia  (ICD-272.4) 9)  Gerd  (ICD-530.81) 10)  Diabetes Mellitus, Type II  (ICD-250.00) 11)  Depression  (ICD-311)  Current Medications (verified): 1)  Effexor Xr 75 Mg Cp24 (Venlafaxine Hcl) .... Take 1 Tablet By Mouth Once A Day 2)  Furosemide 20 Mg Tabs (Furosemide) .... Take 1 Capsule By Mouth Once A Day 3)  Imipramine Hcl 25 Mg Tabs (Imipramine Hcl) .... Take 1 Tablet By Mouth Once A Day 4)  Labetalol Hcl 300 Mg Tabs (Labetalol Hcl) .... Take 1 Tablet By Mouth Twice A Day 5)  Nexium 40 Mg Cpdr (Esomeprazole Magnesium) .... Take 1 Capsule By Mouth Once A Day 6)   Simvastatin 20 Mg Tabs (Simvastatin) .... Take 1 Tablet By Mouth At Bedtime 7)  Verelan Pm 300 Mg Cp24 (Verapamil Hcl) .... Take 1 Capsule By Mouth Once A Day 8)  Klor-Con M20 20 Meq  Tbcr (Potassium Chloride Crys Cr) .... Take 1 Tablet By Mouth Once A Day 9)  Multivitamins   Tabs (Multiple Vitamin) .... One By Mouth Daily 10)  Aspirin 325 Mg  Tbec (Aspirin) .... Once Daily 11)  Vitamin B-12 Cr 1000 Mcg  Tbcr (Cyanocobalamin) .... Once Daily 12)  Nova Max Glucose Test  Strp (Glucose Blood) .... Use Daily As Directed  Allergies: 1)  ! Hydrochlorothiazide (Hydrochlorothiazide) 2)  Micardis Hct (Telmisartan-Hctz) 3)  Altace (Ramipril) 4)  * Tape  Past History:  Past Medical History: Last updated: 08/14/2007 Depression Diabetes mellitus, type II GERD Hyperlipidemia Hypertension Cerebrovascular accident, hx of aortic stenosis, mild Osteoarthritis  Past Surgical History: Last updated: 08/14/2007 Hysterectomy, fibroids-no ca Total knee replacement, R and L rotator cuff surgery TMJ surgery L leg fx  Social History: Last updated: 07/30/2007 Married Alcohol use-no Regular exercise-no  Risk Factors: Exercise: no (07/30/2007)  Risk Factors: Smoking Status: quit (08/08/2010)  Physical Exam  General:  alert and well-developed.   Head:  normocephalic and atraumatic.   Eyes:  pupils equal and pupils round.   Ears:  R ear normal and L ear  normal.   Neck:  No deformities, masses, or tenderness noted. Lungs:  normal respiratory effort and no intercostal retractions.   Heart:  normal rate and regular rhythm.   Abdomen:  Bowel sounds positive,abdomen soft and non-tender without masses, organomegaly or hernias noted.  Msk:  No deformity or scoliosis noted of thoracic or lumbar spine.   Neurologic:  cranial nerves II-XII intact and gait normal.   Skin:  turgor normal and color normal.   Cervical Nodes:  no anterior cervical adenopathy and no posterior cervical adenopathy.     Psych:  good eye contact and flat affect.     Impression & Recommendations:  Problem # 1:  DEPRESSION (ICD-311) poor control she is not taking effexor as prescribed---side effects of being tired change to citalopram she denies suicidal thoughts Her updated medication list for this problem includes:    Effexor Xr 75 Mg Cp24 (Venlafaxine hcl) .Marland Kitchen... Take 1 tablet by mouth once a day    Imipramine Hcl 25 Mg Tabs (Imipramine hcl) .Marland Kitchen... Take 1 tablet by mouth once a day    Citalopram Hydrobromide 20 Mg Tabs (Citalopram hydrobromide) .Marland Kitchen... Take one tablet by mouth daily  Problem # 2:  AORTIC STENOSIS, MILD (ICD-424.1) note she has orthostatic sxs at times note BP is low decrease labetolol Her updated medication list for this problem includes:    Labetalol Hcl 300 Mg Tabs (Labetalol hcl) .Marland Kitchen... Take 1 tablet by mouth once a day    Aspirin 325 Mg Tbec (Aspirin) ..... Once daily  Problem # 3:  DIABETES MELLITUS, TYPE II (ICD-250.00)  no meds needs f/u check labs today Her updated medication list for this problem includes:    Aspirin 325 Mg Tbec (Aspirin) ..... Once daily  Labs Reviewed: Creat: 1.0 (08/11/2009)     Last Eye Exam: normal-pt's report (05/11/2009) Reviewed HgBA1c results: 6.1 (08/11/2009)  6.5 (06/25/2008)  Orders: TLB-A1C / Hgb A1C (Glycohemoglobin) (83036-A1C) TLB-BMP (Basic Metabolic Panel-BMET) (80048-METABOL) TLB-Lipid Panel (80061-LIPID) TLB-ALT (SGPT) (84460-ALT) Venipuncture (40981) TLB-Microalbumin/Creat Ratio, Urine (82043-MALB)  Problem # 4:  HYPERLIPIDEMIA (ICD-272.4)  Her updated medication list for this problem includes:    Simvastatin 20 Mg Tabs (Simvastatin) .Marland Kitchen... Take 1 tablet by mouth at bedtime  Labs Reviewed: SGOT: 24 (08/11/2009)   SGPT: 15 (08/11/2009)   HDL:53.50 (08/11/2009), 62.5 (06/25/2008)  LDL:56 (08/11/2009), 72 (06/25/2008)  Chol:133 (08/11/2009), 156 (06/25/2008)  Trig:120.0 (08/11/2009), 108  (06/25/2008)  Orders: Venipuncture ([address]) TLB-Microalbumin/Creat Ratio, Urine (82043-MALB)  Problem # 5:  GERD (ICD-530.81) she will check into protonix (might be cheaper) Her updated medication list for this problem includes:    Nexium 40 Mg Cpdr (Esomeprazole magnesium) .Marland Kitchen... Take 1 capsule by mouth once a day  Complete Medication List: 1)  Effexor Xr 75 Mg Cp24 (Venlafaxine hcl) .... Take 1 tablet by mouth once a day 2)  Furosemide 20 Mg Tabs (Furosemide) .... Take 1 capsule by mouth once a day 3)  Imipramine Hcl 25 Mg Tabs (Imipramine hcl) .... Take 1 tablet by mouth once a day 4)  Labetalol Hcl 300 Mg Tabs (Labetalol hcl) .... Take 1 tablet by mouth once a day 5)  Nexium 40 Mg Cpdr (Esomeprazole magnesium) .... Take 1 capsule by mouth once a day 6)  Simvastatin 20 Mg Tabs (Simvastatin) .... Take 1 tablet by mouth at bedtime 7)  Verelan Pm 300 Mg Cp24 (Verapamil hcl) .... Take 1 capsule by mouth once a day 8)  Klor-con M20 20 Meq Tbcr (Potassium chloride crys cr) .... Take 1 tablet  by mouth once a day 9)  Multivitamins Tabs (Multiple vitamin) .... One by mouth daily 10)  Aspirin 325 Mg Tbec (Aspirin) .... Once daily 11)  Vitamin B-12 Cr 1000 Mcg Tbcr (Cyanocobalamin) .... Once daily 12)  Nova Max Glucose Test Strp (Glucose blood) .... Use daily as directed 13)  Citalopram Hydrobromide 20 Mg Tabs (Citalopram hydrobromide) .... Take one tablet by mouth daily  Patient Instructions: 1)  Please schedule a follow-up appointment in 1 month. 2)  Check your blood sugars regularly. If your readings are usually above : or below 70 you should contact our office. 3)  See your eye doctor yearly to check for diabetic eye damage. 4)  Check your feet each night for sore areas, calluses or signs of infection. 5)    Prescriptions: CITALOPRAM HYDROBROMIDE 20 MG TABS (CITALOPRAM HYDROBROMIDE) take one tablet by mouth daily  #90 x 3   Entered and Authorized by:   Birdie Sons MD   Signed by:    Birdie Sons MD on 08/08/2010   Method used:   [facility] to        [facility] [phone]* (retail)       [address]       [place], [place]  [address]       Ph: [phone]       Fax: [phone]   RxID:   [id number] NOVA MAX GLUCOSE TEST  STRP (GLUCOSE BLOOD) use daily as directed  #100 x 3   Entered by:   Gladis Riffle, RN   Authorized by:   Birdie Sons MD   Signed by:   Gladis Riffle, RN on 08/08/2010   Method used:   [facility]        [facility] [phone]* (retail)       [address]       [place], [place]  [address]       Ph: [phone]       Fax: [phone]   RxID:   [id number]   Appended Document: fu on dm/njr---PT [facility] // RS   Appended Document: Orders Update     Clinical Lists Changes  Orders: Added new Service order of Specimen Handling ([id number]) - Signed

## 2011-01-10 NOTE — Progress Notes (Signed)
Summary: Refill Request  Phone Note Refill Request Message from:  Patient on September 19, 2010 4:58 PM  Refills Requested: Medication #1:  SIMVASTATIN 20 MG TABS Take 1 tablet by mouth at bedtime   Dosage confirmed as above?Dosage Confirmed   Brand Name Necessary? No   Supply Requested: 3 months   Notes: Insurance will only pay for 90-day supply [facility] [address]   Next Appointment Scheduled: 10.18.11 Initial call taken by: Harold Barban,  September 19, 2010 4:58 PM    Prescriptions: SIMVASTATIN 20 MG TABS (SIMVASTATIN) Take 1 tablet by mouth at bedtime  #90 x 0   Entered by:   Sid Falcon LPN   Authorized by:   Birdie Sons MD   Signed by:   Sid Falcon LPN on 18/84/1660   Method used:   [facility]ronically to        [facility]  [address] [phone]* (retail)       [address]       [place], [place]  [address]       Ph: [phone]       Fax: [phone]   RxID:   [id number]

## 2011-01-10 NOTE — Progress Notes (Signed)
Summary: Pt req refill of Simvastin to [facility]  Phone Note Call from Patient Call back at [facility] Phone [phone]   Caller: Patient Summary of Call: [facility] and was told by the pharmacy that she would have to contact Dr office in order to get refill. Pls [facility] [address]  Initial call taken by: Lucy Antigua,  Apr 13, 2010 4:25 PM  Follow-up for Phone Call        done [facility] 01/06/10 [facility]  Left message for pharmacy to check records   that she should have refills left. Follow-up by: Gladis Riffle, RN,  Apr 13, 2010 5:09 PM

## 2011-01-12 NOTE — Assessment & Plan Note (Signed)
Summary: SINUSITIS? // RS   Vital Signs:  Patient profile:   77 year old female Weight:      208 pounds O2 Sat:      93 % on Room air Temp:     98.0 degrees F oral Pulse rate:   93 / minute BP sitting:   100 / 68  (left arm)  Vitals Entered By: Jeremy Johann CMA (November 16, 2010 12:12 PM)  O2 Flow:  Room air CC: dry cough, sore throat, drainage,nausea,vomiting, pain in ear, headache x3days   CC:  dry cough, sore throat, drainage, nausea, vomiting, pain in ear, and headache x3days.  History of Present Illness: last week had sinus congestion: PNdr and rhinorrhea. had low grade fever (not documneted), also had some nausea---symptoms are much better  She is concerned with mediciations---she takes some of ther medications in the a.m.---about one hour later she develops symptoms of lightheadedness and profound weakness.   In general she complains of fatigue---she thinks related to medications.   She states that she is able to walk short distances without dificulty (quarter mile).   All other systems reviewed and were negative .  Current Medications (verified): 1)  Furosemide 20 Mg Tabs (Furosemide) .... Take 1 Capsule By Mouth Once A Day 2)  Imipramine Hcl 25 Mg Tabs (Imipramine Hcl) .... Take 1 Tablet By Mouth Once A Day 3)  Labetalol Hcl 300 Mg Tabs (Labetalol Hcl) .... Take 1 Tablet By Mouth Once A Day 4)  Nexium 40 Mg Cpdr (Esomeprazole Magnesium) .... Take 1 Capsule By Mouth Once A Day 5)  Simvastatin 20 Mg Tabs (Simvastatin) .... Take 1 Tablet By Mouth At Bedtime 6)  Verelan Pm 300 Mg Cp24 (Verapamil Hcl) .... Take 1 Capsule By Mouth Once A Day 7)  Klor-Con M20 20 Meq  Tbcr (Potassium Chloride Crys Cr) .... Take 1 Tablet By Mouth Once A Day 8)  Multivitamins   Tabs (Multiple Vitamin) .... One By Mouth Daily 9)  Aspirin 325 Mg  Tbec (Aspirin) .... Once Daily 10)  Vitamin B-12 Cr 1000 Mcg  Tbcr (Cyanocobalamin) .... Once Daily 11)  Nova Max Glucose Test  Strp (Glucose  Blood) .... Use Daily As Directed 12)  Venlafaxine Hcl 75 Mg Xr24h-Cap (Venlafaxine Hcl) .... 2 By Mouth Once Daily  Allergies (verified): 1)  ! Hydrochlorothiazide (Hydrochlorothiazide) 2)  Micardis Hct (Telmisartan-Hctz) 3)  Altace (Ramipril) 4)  * Tape  Past History:  Past Medical History: Last updated: 08/14/2007 Depression Diabetes mellitus, type II GERD Hyperlipidemia Hypertension Cerebrovascular accident, hx of aortic stenosis, mild Osteoarthritis  Past Surgical History: Last updated: 08/14/2007 Hysterectomy, fibroids-no ca Total knee replacement, R and L rotator cuff surgery TMJ surgery L leg fx  Social History: Last updated: 07/30/2007 Married Alcohol use-no Regular exercise-no  Risk Factors: Exercise: no (07/30/2007)  Risk Factors: Smoking Status: quit (08/08/2010)  Physical Exam  General:  elderly female in no acute distress. HEENT exam atraumatic, normocephalic, neck supple. Chest clear auscultation cardiac exam S1-S2 are regular. Abdominal exam overweight, to muscles, soft her extremities no edema. Neurologic exam she is alert affect is normal.   Impression & Recommendations:  Problem # 1:  HYPERTENSION (ICD-401.9) i think her sxs of lightheadedness are related to hypotension The following medications were removed from the medication list:    Labetalol Hcl 300 Mg Tabs (Labetalol hcl) .Marland Kitchen... Take 1 tablet by mouth once a day Her updated medication list for this problem includes:    Furosemide 20 Mg Tabs (Furosemide) .Marland KitchenMarland KitchenMarland KitchenMarland Kitchen  Take 1 capsule by mouth once a day    Verelan Pm 300 Mg Cp24 (Verapamil hcl) .Marland Kitchen... Take 1 capsule by mouth once a day  BP today: 100/68 Prior BP: 128/78 (10/03/2010)  Labs Reviewed: K+: 4.9 (08/08/2010) Creat: : 1.4 (08/08/2010)   Chol: 145 (08/08/2010)   HDL: 49.50 (08/08/2010)   LDL: 58 (08/08/2010)   TG: 189.0 (08/08/2010)  Problem # 2:  HYPERLIPIDEMIA (ICD-272.4)  Her updated medication list for this problem  includes:    Simvastatin 20 Mg Tabs (Simvastatin) .Marland Kitchen... Take 1 tablet by mouth at bedtime  Labs Reviewed: SGOT: 24 (08/11/2009)   SGPT: 16 (08/08/2010)   HDL:49.50 (08/08/2010), 53.50 (08/11/2009)  LDL:58 (08/08/2010), 56 (08/11/2009)  Chol:145 (08/08/2010), 133 (08/11/2009)  Trig:189.0 (08/08/2010), 120.0 (08/11/2009)  Problem # 3:  DIABETES MELLITUS, TYPE II (ICD-250.00)  Her updated medication list for this problem includes:    Aspirin 325 Mg Tbec (Aspirin) ..... Once daily  Labs Reviewed: Creat: 1.4 (08/08/2010)     Last Eye Exam: normal-pt's report (05/11/2009) Reviewed HgBA1c results: 6.3 (08/08/2010)  6.1 (08/11/2009)  Complete Medication List: 1)  Furosemide 20 Mg Tabs (Furosemide) .... Take 1 capsule by mouth once a day 2)  Imipramine Hcl 25 Mg Tabs (Imipramine hcl) .... Take 1 tablet by mouth once a day 3)  Nexium 40 Mg Cpdr (Esomeprazole magnesium) .... Take 1 capsule by mouth once a day 4)  Simvastatin 20 Mg Tabs (Simvastatin) .... Take 1 tablet by mouth at bedtime 5)  Verelan Pm 300 Mg Cp24 (Verapamil hcl) .... Take 1 capsule by mouth once a day 6)  Klor-con M20 20 Meq Tbcr (Potassium chloride crys cr) .... Take 1 tablet by mouth once a day 7)  Multivitamins Tabs (Multiple vitamin) .... One by mouth daily 8)  Aspirin 325 Mg Tbec (Aspirin) .... Once daily 9)  Vitamin B-12 Cr 1000 Mcg Tbcr (Cyanocobalamin) .... Once daily 10)  Nova Max Glucose Test Strp (Glucose blood) .... Use daily as directed 11)  Venlafaxine Hcl 75 Mg Xr24h-cap (Venlafaxine hcl) .... 2 by mouth once daily  Patient Instructions: 1)  labetolol 300 mg ---take 1/2 by mouth two times a day for 4 days and then 1/2 by mouth at bedtime for 4 nights and then stop.  2)  cnacel all other appts with me 3)  see me early January 4)  labs one week prior to visit 5)  lipids---272.4 6)  lfts-995.2 7)  bmet-995.2 8)  A1C-250.02 9)     Prescriptions: VENLAFAXINE HCL 75 MG XR24H-CAP (VENLAFAXINE HCL) 2 by mouth  once daily  #180 x 3   Entered and Authorized by:   Birdie Sons MD   Signed by:   Birdie Sons MD on 11/16/2010   Method used:   Electronically to        [facility]  [address] [phone][facility] (retail)       [address]       [place], [place]  [address]       Ph: [phone]       Fax: [phone]   RxID:   [id number]    Orders Added: 1)  Est. Patient Level IV [24401]

## 2011-01-14 ENCOUNTER — Emergency Department (HOSPITAL_COMMUNITY)
Admission: EM | Admit: 2011-01-14 | Discharge: 2011-01-14 | Disposition: A | Discharge status: Home / Self Care | Payer: Medicare Other | Attending: Emergency Medicine | Admitting: Emergency Medicine

## 2011-01-14 DIAGNOSIS — R1310 Dysphagia, unspecified: Secondary | ICD-10-CM | POA: Insufficient documentation

## 2011-01-14 DIAGNOSIS — R0602 Shortness of breath: Secondary | ICD-10-CM | POA: Insufficient documentation

## 2011-01-14 DIAGNOSIS — I10 Essential (primary) hypertension: Secondary | ICD-10-CM | POA: Insufficient documentation

## 2011-01-14 DIAGNOSIS — R0609 Other forms of dyspnea: Secondary | ICD-10-CM | POA: Insufficient documentation

## 2011-01-14 DIAGNOSIS — E119 Type 2 diabetes mellitus without complications: Secondary | ICD-10-CM | POA: Insufficient documentation

## 2011-01-14 DIAGNOSIS — Z8679 Personal history of other diseases of the circulatory system: Secondary | ICD-10-CM | POA: Insufficient documentation

## 2011-01-14 DIAGNOSIS — T43205A Adverse effect of unspecified antidepressants, initial encounter: Secondary | ICD-10-CM | POA: Insufficient documentation

## 2011-01-14 DIAGNOSIS — R6889 Other general symptoms and signs: Secondary | ICD-10-CM | POA: Insufficient documentation

## 2011-01-14 DIAGNOSIS — R0989 Other specified symptoms and signs involving the circulatory and respiratory systems: Secondary | ICD-10-CM | POA: Insufficient documentation

## 2011-01-15 ENCOUNTER — Other Ambulatory Visit: Payer: Self-pay | Admitting: Internal Medicine

## 2011-01-15 DIAGNOSIS — E785 Hyperlipidemia, unspecified: Secondary | ICD-10-CM

## 2011-01-17 ENCOUNTER — Telehealth: Payer: Self-pay | Admitting: Internal Medicine

## 2011-01-17 NOTE — Telephone Encounter (Signed)
Pt req to get refill of Prednisone 20mg   Pls call in to [facility] [address]. Pt req work in appt for 01/18/11, as late in the day as possible.

## 2011-01-17 NOTE — Telephone Encounter (Signed)
Prednisone for what [facility] for OV

## 2011-01-18 ENCOUNTER — Encounter: Payer: Self-pay | Admitting: Internal Medicine

## 2011-01-18 ENCOUNTER — Ambulatory Visit (INDEPENDENT_AMBULATORY_CARE_PROVIDER_SITE_OTHER): Payer: Medicare Other | Admitting: Internal Medicine

## 2011-01-18 DIAGNOSIS — I10 Essential (primary) hypertension: Secondary | ICD-10-CM

## 2011-01-18 DIAGNOSIS — E785 Hyperlipidemia, unspecified: Secondary | ICD-10-CM

## 2011-01-18 DIAGNOSIS — Z8679 Personal history of other diseases of the circulatory system: Secondary | ICD-10-CM

## 2011-01-18 DIAGNOSIS — E119 Type 2 diabetes mellitus without complications: Secondary | ICD-10-CM

## 2011-01-18 MED ORDER — LABETALOL HCL 200 MG PO TABS: 200.0000 mg | ORAL_TABLET | Freq: Two times a day (BID) | ORAL | Status: DC

## 2011-01-18 NOTE — Assessment & Plan Note (Signed)
Summary: 4 month roa//alp rsc bmp/njr pt rsc/njr   History of Present Illness:  Follow-Up Visit      This is a 77 year old woman who presents for Follow-up visit.  The patient denies chest pain and palpitations.  Since the last visit the patient notes no new problems or concerns.  The patient reports taking meds as prescribed.  When questioned about possible medication side effects, the patient notes none.  says venlafaxine makes her even sleepier than previous medication  All other systems reviewed and were negative except for fatigue  Allergies: 1)  ! Hydrochlorothiazide (Hydrochlorothiazide) 2)  Micardis Hct (Telmisartan-Hctz) 3)  Altace (Ramipril) 4)  * Tape  Past History:  Past Medical History: Last updated: 08/14/2007 Depression Diabetes mellitus, type II GERD Hyperlipidemia Hypertension Cerebrovascular accident, hx of aortic stenosis, mild Osteoarthritis  Past Surgical History: Last updated: 08/14/2007 Hysterectomy, fibroids-no ca Total knee replacement, R and L rotator cuff surgery TMJ surgery L leg fx  Social History: Last updated: 07/30/2007 Married Alcohol use-no Regular exercise-no  Risk Factors: Exercise: no (07/30/2007)  Risk Factors: Smoking Status: quit (08/08/2010)  Physical Exam  General:   overweight female in no acute distress. HEENT exam atraumatic, neck supple, extraocular muscles are intact. chest his current auscultation cardiac exam S1-S2 are regular. Abdominal exam obese, bowel sounds, soft. Extremities no edema. Neurologic exam alert with a normal gait.   Impression & Recommendations:  Problem # 1:  HYPERTENSION (ICD-401.9) controlled continue current medications  Her updated medication list for this problem includes:    Furosemide 20 Mg Tabs (Furosemide) .Marland Kitchen... Take 1 capsule by mouth once a day    Verelan Pm 300 Mg Cp24 (Verapamil hcl) .Marland Kitchen... Take 1 capsule by mouth once a day  Prior BP: 100/68 (11/16/2010)  Labs  Reviewed: K+: 4.9 (12/28/2010) Creat: : 1.5 (12/28/2010)   Chol: 136 (12/28/2010)   HDL: 48.20 (12/28/2010)   LDL: 66 (12/28/2010)   TG: 108.0 (12/28/2010)  Problem # 2:  DIABETES MELLITUS, TYPE II (ICD-250.00) controlled continue current medications  Her updated medication list for this problem includes:    Aspirin 325 Mg Tbec (Aspirin) ..... Once daily  Labs Reviewed: Creat: 1.5 (12/28/2010)     Last Eye Exam: normal-pt's report (05/11/2009) Reviewed HgBA1c results: 6.5 (12/28/2010)  6.3 (08/08/2010)  Problem # 3:  DEPRESSION (ICD-311) not well controlled she alos was not doing well on lexapro will try cymbalta.  The following medications were removed from the medication list:    Venlafaxine Hcl 75 Mg Xr24h-cap (Venlafaxine hcl) .Marland Kitchen... 2 by mouth once daily Her updated medication list for this problem includes:    Imipramine Hcl 25 Mg Tabs (Imipramine hcl) .Marland Kitchen... Take 1 tablet by mouth once a day    Cymbalta 30 Mg Cpep (Duloxetine hcl) .Marland Kitchen... 1 by mouth once daily for 7 days and then 2 at the same time daily  Complete Medication List: 1)  Furosemide 20 Mg Tabs (Furosemide) .... Take 1 capsule by mouth once a day 2)  Imipramine Hcl 25 Mg Tabs (Imipramine hcl) .... Take 1 tablet by mouth once a day 3)  Nexium 40 Mg Cpdr (Esomeprazole magnesium) .... Take 1 capsule by mouth once a day 4)  Simvastatin 20 Mg Tabs (Simvastatin) .... Take 1 tablet by mouth at bedtime 5)  Verelan Pm 300 Mg Cp24 (Verapamil hcl) .... Take 1 capsule by mouth once a day 6)  Klor-con M20 20 Meq Tbcr (Potassium chloride crys cr) .... Take 1 tablet by mouth once a day  7)  Multivitamins Tabs (Multiple vitamin) .... One by mouth daily 8)  Aspirin 325 Mg Tbec (Aspirin) .... Once daily 9)  Vitamin B-12 Cr 1000 Mcg Tbcr (Cyanocobalamin) .... Once daily 10)  Nova Max Glucose Test Strp (Glucose blood) .... Use daily as directed 11)  Cymbalta 30 Mg Cpep (Duloxetine hcl) .Marland Kitchen.. 1 by mouth once daily for 7 days and then 2  at the same time daily  Patient Instructions: 1)  se me 4-5 weeks   Orders Added: 1)  Est. Patient Level V [16109] 2)  Est. Patient Level IV [60454]

## 2011-01-18 NOTE — Progress Notes (Signed)
  Subjective:    Patient ID: [name], female    DOB: 07/26/1934, 77 y.o.   MRN: [id number]  HPI Had allergic reaction to cymbalta---went to ED---treated with prednsione---no recurrent facial swelling. She had been on cymbalta for only 10 days. Since that time and at the ed she had noted marked HTN (150s-200/70-110)  Depression: she was starting to feel better on cymbalta   Review of Systems  Constitutional: Negative for diaphoresis, activity change and appetite change.  HENT: Positive for facial swelling (allergic reaction to cymbalta?). Negative for ear pain, nosebleeds and neck pain.   Eyes: Negative for pain and discharge.  Respiratory: Negative for cough, chest tightness and shortness of breath.   Cardiovascular: Negative for chest pain.  Gastrointestinal: Negative for abdominal pain.  Genitourinary: Negative for dysuria and flank pain.  Musculoskeletal: Negative for back pain and gait problem.  Neurological: Negative for dizziness.  Hematological: Negative for adenopathy.  Psychiatric/Behavioral: Negative for confusion.       Objective:   Physical Exam  Constitutional: She appears well-developed and well-nourished. No distress.  HENT:  Head: Normocephalic.  Right Ear: External ear normal.  Left Ear: External ear normal.  Eyes: EOM are normal. Right eye exhibits no discharge. Left eye exhibits no discharge.  Neck: No thyromegaly present.  Cardiovascular: Normal rate.   Pulmonary/Chest: No respiratory distress. She has no wheezes. She exhibits no tenderness.  Abdominal: Soft. Bowel sounds are normal. She exhibits no distension. There is no tenderness.  Musculoskeletal: She exhibits no edema.  Lymphadenopathy:    She has no cervical adenopathy.  Neurological: No cranial nerve deficit.  Psychiatric: She has a normal mood and affect. Her behavior is normal.          Assessment & Plan:

## 2011-01-19 NOTE — Telephone Encounter (Signed)
No answer and no working Recruitment consultant.

## 2011-01-21 NOTE — Assessment & Plan Note (Signed)
Adequate control. Continue current medications. 

## 2011-01-21 NOTE — Assessment & Plan Note (Signed)
No known recurrence 

## 2011-01-21 NOTE — Assessment & Plan Note (Signed)
Previously controlled on no medications.

## 2011-01-21 NOTE — Assessment & Plan Note (Signed)
Controlled.  Continue current medications.

## 2011-01-24 NOTE — Telephone Encounter (Signed)
Pt has appt on 01/25/11

## 2011-01-25 ENCOUNTER — Ambulatory Visit: Payer: Medicare Other | Admitting: Internal Medicine

## 2011-01-25 ENCOUNTER — Other Ambulatory Visit: Payer: Self-pay | Admitting: Internal Medicine

## 2011-02-06 ENCOUNTER — Ambulatory Visit: Payer: Self-pay | Admitting: Internal Medicine

## 2011-02-08 ENCOUNTER — Ambulatory Visit (INDEPENDENT_AMBULATORY_CARE_PROVIDER_SITE_OTHER): Payer: Medicare Other | Admitting: Internal Medicine

## 2011-02-08 ENCOUNTER — Encounter: Payer: Self-pay | Admitting: Internal Medicine

## 2011-02-08 DIAGNOSIS — I10 Essential (primary) hypertension: Secondary | ICD-10-CM

## 2011-02-08 DIAGNOSIS — F411 Generalized anxiety disorder: Secondary | ICD-10-CM

## 2011-02-08 NOTE — Assessment & Plan Note (Signed)
Doing well, only on imipramine

## 2011-02-08 NOTE — Assessment & Plan Note (Signed)
Well controlled. -continue current meds  

## 2011-02-08 NOTE — Progress Notes (Signed)
  Subjective:    Patient ID: [name], female    DOB: Jan 30, 1934, 77 y.o.   MRN: [id number]  HPI Htn: home BPs 130s/60s---tolerating meds  Mood---much better, only taking imipramine   patient with a history of aortic stenosis. Patient denies any shortness of breath, PND, orthopnea. No presyncope or syncope. No chest pain.   Past Medical History  Diagnosis Date  . Depression   . Diabetes mellitus   . GERD (gastroesophageal reflux disease)   . Hypertension   . Hyperlipidemia   . CVA (cerebral infarction)   . Aortic stenosis   . Osteoarthritis    Past Surgical History  Procedure Date  . Abdominal hysterectomy     fibroids, no cancer  . Joint replacement     left and right knee  . Rotator cuff repair   . Temporomandibular joint surgery   . Fracture surgery     left leg    reports that she quit smoking about 30 years ago. She does not have any smokeless tobacco history on file. She reports that she does not drink alcohol or use illicit drugs. family history includes Dementia in her mother; Heart attack in her father; Heart failure in her sister; and Hypertension in her mother.  Allergies  Allergen Reactions  . Cymbalta (Duloxetine Hcl) Anaphylaxis  . Hydrochlorothiazide     REACTION: rash  . Ramipril     REACTION: difficulty breathing  . Telmisartan-Hctz     REACTION: rash   Review of Systems  patient denies chest pain, shortness of breath, orthopnea. Denies lower extremity edema, abdominal pain, change in appetite, change in bowel movements. Patient denies rashes, musculoskeletal complaints. No other specific complaints in a complete review of systems.      Objective:   Physical Exam  Well-developed well-nourished female in no acute distress. HEENT exam atraumatic, normocephalic, extraocular muscles are intact. Neck is supple. No jugular venous distention no thyromegaly. Chest clear to auscultation without increased work of breathing. Cardiac exam S1 and S2 are  regular. Abdominal exam active bowel sounds, soft, nontender. Extremities no edema. Neurologic exam she is alert without any motor sensory deficits. Gait is normal.     Assessment & Plan:

## 2011-03-10 ENCOUNTER — Other Ambulatory Visit: Payer: Self-pay | Admitting: *Deleted

## 2011-03-10 MED ORDER — FUROSEMIDE 20 MG PO TABS: 20.0000 mg | ORAL_TABLET | Freq: Every day | ORAL | Status: DC

## 2011-03-20 ENCOUNTER — Other Ambulatory Visit: Payer: Self-pay | Admitting: *Deleted

## 2011-03-20 MED ORDER — VERAPAMIL HCL ER 300 MG PO CP24: 300.0000 mg | ORAL_CAPSULE | Freq: Every day | ORAL | Status: DC

## 2011-03-31 ENCOUNTER — Ambulatory Visit: Payer: Medicare Other | Admitting: Internal Medicine

## 2011-04-03 ENCOUNTER — Ambulatory Visit (INDEPENDENT_AMBULATORY_CARE_PROVIDER_SITE_OTHER): Payer: Medicare Other | Admitting: Internal Medicine

## 2011-04-03 ENCOUNTER — Encounter: Payer: Self-pay | Admitting: Internal Medicine

## 2011-04-03 DIAGNOSIS — R109 Unspecified abdominal pain: Secondary | ICD-10-CM

## 2011-04-03 DIAGNOSIS — R1011 Right upper quadrant pain: Secondary | ICD-10-CM

## 2011-04-03 LAB — CBC WITH DIFFERENTIAL/PLATELET
Basophils Absolute: 0 10*3/uL (ref 0.0–0.1)
Eosinophils Absolute: 0.1 10*3/uL (ref 0.0–0.7)
HCT: 39.5 % (ref 36.0–46.0)
Hemoglobin: 13.4 g/dL (ref 12.0–15.0)
Lymphs Abs: 1 10*3/uL (ref 0.7–4.0)
MCHC: 34.1 g/dL (ref 30.0–36.0)
Monocytes Absolute: 0.4 10*3/uL (ref 0.1–1.0)
Neutro Abs: 4.8 10*3/uL (ref 1.4–7.7)
RDW: 12.8 % (ref 11.5–14.6)

## 2011-04-03 LAB — HEPATIC FUNCTION PANEL: Albumin: 4 g/dL (ref 3.5–5.2)

## 2011-04-04 ENCOUNTER — Telehealth: Payer: Self-pay

## 2011-04-04 ENCOUNTER — Other Ambulatory Visit: Payer: Self-pay | Admitting: Internal Medicine

## 2011-04-04 ENCOUNTER — Ambulatory Visit
Admission: RE | Admit: 2011-04-04 | Discharge: 2011-04-04 | Disposition: A | Discharge status: Home / Self Care | Payer: Medicare Other | Source: Ambulatory Visit | Attending: Internal Medicine | Admitting: Internal Medicine

## 2011-04-04 DIAGNOSIS — R109 Unspecified abdominal pain: Secondary | ICD-10-CM

## 2011-04-04 NOTE — Telephone Encounter (Signed)
Message copied by Kyung Rudd on Tue Apr 04, 2011  4:17 PM ------      Message from: Letitia Libra, Maisie Fus      Created: Tue Apr 04, 2011 10:33 AM       Labs nl

## 2011-04-04 NOTE — Telephone Encounter (Signed)
Pt aware.

## 2011-04-04 NOTE — Telephone Encounter (Signed)
Pt aware and verbalized understanding.  

## 2011-04-04 NOTE — Telephone Encounter (Signed)
Message copied by Kyung Rudd on Tue Apr 04, 2011  4:16 PM ------      Message from: Clifton Custard      Created: Tue Apr 04, 2011 12:41 PM       [place] reviewed. Shows mild fattiness of liver (not the source of her pain and usually treated naturally with wt loss and control of cholesterol) and likely tiny gallbladder polyp (doubt is causing her symptoms.) recommend and will schedule GI consult.

## 2011-04-05 ENCOUNTER — Other Ambulatory Visit: Payer: Medicare Other

## 2011-04-09 ENCOUNTER — Encounter: Payer: Self-pay | Admitting: Internal Medicine

## 2011-04-09 DIAGNOSIS — R1011 Right upper quadrant pain: Secondary | ICD-10-CM | POA: Insufficient documentation

## 2011-04-09 NOTE — Progress Notes (Signed)
  Subjective:    Patient ID: [name], female    DOB: [birth year], 77 y.o.   MRN: [id number]  HPI Pt presents to clinic for evaluation of abdominal pain. Notes intermittent h/o ruq abd pain that does not radicate. Is associated with nausea without emesis, fever or chills. Pain occurs with food often fatty food. No change in bowel habits. No other alleviating or exacerbating factors.   Reviewed pmh, medications and allergies    Review of Systems see hpi     Objective:   Physical Exam  Nursing note and vitals reviewed. Constitutional: She appears well-developed and well-nourished. No distress.  HENT:  Head: Normocephalic and atraumatic.  Right Ear: External ear normal.  Left Ear: External ear normal.  Eyes: Conjunctivae are normal. No scleral icterus.  Abdominal: Soft. Bowel sounds are normal. She exhibits no distension and no mass. There is no tenderness. There is no rebound and no guarding.  Neurological: She is alert.  Skin: Skin is warm and dry. No rash noted. She is not diaphoretic. No erythema.  Psychiatric: She has a normal mood and affect.          Assessment & Plan:

## 2011-04-09 NOTE — Assessment & Plan Note (Signed)
Schedule ruq [place]. Obtain cbc, lft. Consider gi consult if workup unrevealing

## 2011-04-18 ENCOUNTER — Emergency Department (HOSPITAL_COMMUNITY): Payer: Medicare Other

## 2011-04-18 ENCOUNTER — Emergency Department (HOSPITAL_COMMUNITY)
Admission: EM | Admit: 2011-04-18 | Discharge: 2011-04-18 | Disposition: A | Discharge status: Home / Self Care | Payer: Medicare Other | Attending: Emergency Medicine | Admitting: Emergency Medicine

## 2011-04-18 DIAGNOSIS — M436 Torticollis: Secondary | ICD-10-CM | POA: Insufficient documentation

## 2011-04-18 DIAGNOSIS — M542 Cervicalgia: Secondary | ICD-10-CM | POA: Insufficient documentation

## 2011-04-18 DIAGNOSIS — R51 Headache: Secondary | ICD-10-CM | POA: Insufficient documentation

## 2011-04-18 DIAGNOSIS — M25519 Pain in unspecified shoulder: Secondary | ICD-10-CM | POA: Insufficient documentation

## 2011-04-18 DIAGNOSIS — Z7982 Long term (current) use of aspirin: Secondary | ICD-10-CM | POA: Insufficient documentation

## 2011-04-18 DIAGNOSIS — Z8673 Personal history of transient ischemic attack (TIA), and cerebral infarction without residual deficits: Secondary | ICD-10-CM | POA: Insufficient documentation

## 2011-04-18 DIAGNOSIS — I10 Essential (primary) hypertension: Secondary | ICD-10-CM | POA: Insufficient documentation

## 2011-04-18 DIAGNOSIS — E119 Type 2 diabetes mellitus without complications: Secondary | ICD-10-CM | POA: Insufficient documentation

## 2011-04-18 DIAGNOSIS — Z79899 Other long term (current) drug therapy: Secondary | ICD-10-CM | POA: Insufficient documentation

## 2011-04-20 ENCOUNTER — Encounter: Payer: Self-pay | Admitting: Internal Medicine

## 2011-04-20 ENCOUNTER — Ambulatory Visit (INDEPENDENT_AMBULATORY_CARE_PROVIDER_SITE_OTHER): Payer: Medicare Other | Admitting: Internal Medicine

## 2011-04-20 DIAGNOSIS — M542 Cervicalgia: Secondary | ICD-10-CM

## 2011-04-23 ENCOUNTER — Encounter: Payer: Self-pay | Admitting: Internal Medicine

## 2011-04-23 DIAGNOSIS — M542 Cervicalgia: Secondary | ICD-10-CM | POA: Insufficient documentation

## 2011-04-23 NOTE — Assessment & Plan Note (Signed)
Improved.  Continue muscle relaxer p.r.n. Cautioned regarding possible sedating effect.Resume topical heat as needed. Consider physical therapy if no improvement.

## 2011-04-23 NOTE — Progress Notes (Signed)
  Subjective:    Patient ID: [name], female    DOB: 1934-02-19, 77 y.o.   MRN: [id number]  HPI Patient presents to clinic for evaluation of neck pain. Recently seen in the emergency department on May 8 with a one-week history of right neck pain right shoulder pain and right occipital pain. There is no associated paresthesias. Patient attempted Tylenol and heat without significant improvement. During emergency department evaluation underwent head CT without acute finding. CT of the spine demonstrated DDD in C5-T1 with moderate right-sided foraminal narrowing. Patient was felt to have muscular spasm and given Ativan and Vicodin in the emergency department. Was discharged on Norflex and Lortab p.r.n. Notes improvement of pain and range of motion. Pain does worsen with head turning. Has no radicular symptoms numbness tingling or weakness. There's been no injury or trauma. No other complaints  Reviewed past medical history, medications and allergies    Review of Systemssee history of present illness     Objective:   Physical Exam  Nursing note and vitals reviewed. Constitutional: She appears well-developed and well-nourished. No distress.  HENT:  Head: Normocephalic and atraumatic.  Left Ear: External ear normal.  Nose: Nose normal.  Eyes: Conjunctivae are normal. No scleral icterus.  Neck: Neck supple.  Musculoskeletal:       Examination of the neck: Full range of motion limited only by pain. Reproducible pain and tenderness along the neck paraspinal muscles.  Neurological: She is alert.  Skin: Skin is warm. She is not diaphoretic.          Assessment & Plan:

## 2011-04-25 NOTE — Discharge Summary (Signed)
[name], [name] NO.:  [id number]   MEDICAL RECORD NO.:  [id number]          PATIENT TYPE:  INP   LOCATION:  4743                         FACILITY:  [facility]   PHYSICIAN:  Sandford Craze, NP DATE OF BIRTH:  [birth year]   DATE OF ADMISSION:  03/22/2008  DATE OF DISCHARGE:  03/24/2008                               DISCHARGE SUMMARY   DISCHARGE DIAGNOSES:  1. Altered mental status likely secondary to narcotics status post      right shoulder rotator cuff hemiarthroplasty surgery and possibly      some mild underlying dementia.  2. Acute renal insufficiency secondary to dehydration, resolved.  3. Status post right shoulder hemiarthroplasty, March 19, 2008.  4. Hypertension.  5. Diabetes type 2.  6. Gastroesophageal reflux disease.  7. Depression/anxiety.   HISTORY OF PRESENT ILLNESS:  [name] is a 77 year old female admitted  on March 22, 2008 due to altered mental status and slurred speech.  She  had a right shoulder hemiarthroplasty surgery 2 days prior to this  admission, but apparently had had a postoperative 36 hours stretch  without sleep which was apparently due to pain medication.  She was then  discharged to home on Darvocet, hydromorphone, and had had decreased  mentation since the night prior to this admission which had persisted  for more than 24 hours.  It was also associated with some slurred speech  and difficulty following commands as well as some hallucinations.  The  patient was brought to the emergency department where she received a  dose of Narcan, which apparently did not improve her condition.  She  underwent a head CT, which did not show any acute intracranial  abnormality.  She was admitted for further evaluation and treatment.   PAST MEDICAL HISTORY:  1. Hypertension.  2. Diabetes type 2.  3. History of CVA.  4. History of right and left knee total arthroplasty.  5. C. diff colitis.  6. Diverticulosis.  7. GERD.  8.  Depression.  9. Anxiety.  10.Obesity.  11.Glaucoma.  12.History of obstructive sleep apnea.   HOSPITAL COURSE:  1. Altered mental status.  The patient was admitted and underwent CT      of the head, which was negative.  She was noted to have some mild      renal insufficiency with a creatinine of 1.6, which trended down      with IV hydration.  Creatinine at time of discharge was 0.8.  Renal      ultrasound was negative as well as chest x-ray which showed no      active disease.  The patient had no clinical signs of infection and      her mental status quickly improved to its baseline.  We plan to      discharge the patient to home.  She has been urged to minimize      narcotics and use Tylenol if able.  In addition, she will need      evaluation for question of underlying mild dementia which the      family address as a concern during this  admission.  We will defer      to the patient's primary MD.   DISCHARGE MEDICATIONS:  1. Lasix 40 mg p.o. daily.  2. Aspirin 325 mg p.o. daily.  3. Labetalol 300 mg p.o. b.i.d.  4. Nexium 40 mg p.o. daily.  5. Effexor XR 150 mg p.o. daily.  6. Actos 15 mg p.o. daily.  7. Multivitamin 1 tablet p.o. daily.  8. Verelan PM 300 mg p.o. daily.  9. Potassium 20 mEq p.o. daily.  10.Imipramine HCl 25 mg p.o. daily.  11.Zocor 20 mg p.o. daily.  12.Darvocet-N 100 1 tablet p.o. 4 times daily and as needed.   The patient was instructed to discontinue hydromorphone and  methocarbamol and instructed not to take more than 1 Darvocet every 6  hours and try to substitute Tylenol 650 mg instead if pain is not  severe.   DISPOSITION:  The patient will be discharged home with home health, PT,  OT.   FOLLOWUP:  With Dr. Birdie Sons on Tuesday, April 28 at 10:45 a.m. and  she is instructed to follow up with orthopedics as scheduled.      Sandford Craze, NP     MO/MEDQ  D:  03/24/2008  T:  03/25/2008  Job:  [id number]   cc:   Valetta Mole. Swords, MD

## 2011-04-25 NOTE — H&P (Signed)
[name], Kaufman NO.:  [id number]   MEDICAL RECORD NO.:  [id number]          PATIENT TYPE:  INP   LOCATION:  1827                         FACILITY:  [facility]   PHYSICIAN:  Donalynn Furlong, MD      DATE OF BIRTH:  03-Jun-1934   DATE OF ADMISSION:  03/22/2008  DATE OF DISCHARGE:                              HISTORY & PHYSICAL   PRIMARY CARE PHYSICIAN:  Birdie Sons, M.D.   CHIEF COMPLAINT:  Altered mental status and slurred speech.   HISTORY OF PRESENT ILLNESS:  [name] is a 77 year old female. She  lives with her husband. She presented to [facility] emergency  room tonight with complaint of altered mental status and slurred speech.  The patient had a right shoulder rotator cuff hemiarthroplasty surgery  on Thursday, 2 days ago. Postoperatively, she had persistent awake  status for 36 hours that was supposed be due to pain medicine. She was  discharged home on Darvocet and hydromorphone as her pain medicine at  home. Since the release to home, she has showed altered mental status.  She stated having decreased mentation since last night and it persisted  for more than 24 hours. It was also associated with slurred speech and  not following commands and some hallucinations, according to the family.  She did shows some generalized weakness also. Initially, the family  thought it was due to her pain medicine, so they waited for 24 hours  before they brought her to the emergency room. When she came to the  emergency room, she received a dose of Narcan, which did not improve her  condition. The dose was repeated again but still she did not change and  subsequently, ER physician did a CT head, which did not show any acute  intracranial abnormality and she was admitted because of her persistent  altered mental status and slurred speech. The patient, on encounter, is  alert and responding to commands appropriately. She is trying to answer  all the questions  appropriately but still has some difficulty with her  memory. History was provided by the family. The patient denied any pain  at the time of encounter. She did have some back pain, diaphoresis,  difficulty walking, and shortness of breath according to the family at  home. They denied any fever or cough. No sputum production, chest pain,  nausea, vomiting, blood in the stool, diarrhea, or urinary complaint at  home. They denied any incontinence also.   PAST MEDICAL HISTORY:  Hypertension, diabetes mellitus, history of  cerebrovascular accident in the past, right and left knee total  arthroplasty, C.Diff colitis, diverticulosis, gastroesophageal reflux  disease, depression, anxiety, obesity, glaucoma, and obstructive sleep  apnea.   PAST SURGICAL HISTORY:  Hysterectomy and plus surgeries as mentioned in  the past medical history.   ALLERGIES:  RAMIPRIL, ADACEL, OXYCODONE.   HOME MEDICATIONS:  Lasix, aspirin, Labetalol, Effexor, Actos, Premarin,  Imipramine, Simvastatin, Methocarbamol, Nexium, Darvocet, and Verapamil.   FAMILY HISTORY:  Noncontributory.   SOCIAL HISTORY:  The patient lives with her husband. No recent alcohol,  drug, or tobacco use.  REVIEW OF SYSTEMS:  Positive as per HPI. Otherwise, negative review of  systems done on 14 systems.   PHYSICAL EXAMINATION:  VITAL SIGNS:  Blood pressure 121/72, pulse 98,  respiratory rate 20, temperature 98 Fahrenheit. Oxygen saturation 98% on  room air.  GENERAL:  The patient is sleepy at the time of examination but she woke  up and after that, she was alert, following commands, and responds  appropriately. She is partially oriented to time and place.  HEENT:  Normocephalic and atraumatic. Pupils are equal, round, and  reactive to light and accommodation. Extraocular muscles intact. Oral  cavity, mucosa are dry. No thrush noted.  NECK:  No thyromegaly or JVD.  CARDIO:  S1 and S2 regular. No murmur, rub, or gallop.  LUNGS:  Clear  to auscultation bilaterally. No wheezing, rub, or crackles  noted.  ABDOMEN:  Nontender, nondistended. Bowel sounds present.  EXTREMITIES:  No clubbing, cyanosis, or edema. Pulse palpable in all 4  extremities.  SKIN:  No rash or bruits.  NEUROLOGIC:  Examination shows intact muscular strength in all 4  extremities. She is moving all 4 extremities appropriately. Sensation in  the plexus aer intact in all 4 extremities also. She does have a  component of slurred speech but I still could understand whenever she  was talking.   LABORATORY DATA:  EKG rate 95 per minute, normal sinus rhythm, normal  axis, normal STT waves. No acute changes.   CT head with no acute intracranial abnormalities. Chest x-ray with low  lung volumes, without acute cardiopulmonary disease.   Basic metabolic panel shows sodium of 134, BUN 24, creatinine 1.6,  glucose 101. Troponin negative. Urine shows 3 to 6 white blood cells,  few bacteria but not consistent with UTI.   ASSESSMENT:  1. Altered mental status and slurred speech. Could be due to      medication side effect versus cerebrovascular accident, acute renal      insufficiency, status post right shoulder hemiarthroplasty.  2. History of diabetes mellitus.  3. Hypertension.  4. Hyperlipidemia.  5. Gastroesophageal reflux disease.  6. Bilateral knee arthroplasty.  7. Hysterectomy.  8. Depression.  9. Obesity.  10.Diverticulosis.  11.Obstructive sleep apnea.   PLAN:  Will admit the patient to Dr. Rene Paci on telemetry bed.  Will keep her NPO except for medicines, ice chips, and water. Will check  neurologic checks q.4 hours for the next 48 hours. Will get neurology  consultation in the morning for further workup. Will get dysphagia and  PT/OT consult also. Will continue her aspirin at 325 mg  q.24 hours. Will also start IV Protonix for prophylaxis and Zocor 40 mg  q.24 hours. Will give her IV fluid, normal saline at 60 cc per hour for   hydration and will start her on sliding scale insulin. Other home  medications will be put on hold until further workup is done and she is  more alert and oriented.      Donalynn Furlong, MD  Electronically Signed     TVP/MEDQ  D:  03/22/2008  T:  03/22/2008  Job:  [id number]   cc:   Valetta Mole. Swords, MD  Raenette Rover. Felicity Coyer, MD

## 2011-04-25 NOTE — Op Note (Signed)
NAMESISSY, GOETZKE NO.:  [id number]   MEDICAL RECORD NO.:  [id number]          PATIENT TYPE:  INP   LOCATION:  5036                         FACILITY:  [facility]   PHYSICIAN:  Vania Rea. Supple, M.D.  DATE OF BIRTH:  May 07, 1934   DATE OF PROCEDURE:  DATE OF DISCHARGE:                               OPERATIVE REPORT   PREOPERATIVE DIAGNOSIS:  Right shoulder rotator cuff tear arthropathy.   POSTOPERATIVE DIAGNOSIS:  Right shoulder rotator cuff tear arthropathy.   PROCEDURE:  Right shoulder cuff tear arthropathy hemiarthroplasty  utilizing a size 8 Press-Fit humeral stem and a 48 x 18 cuff tear  arthropathy humeral head.   SURGEON:  Vania Rea. Supple, MD   ASSISTANT:  Lucita Lora. Shuford, PAC   ANESTHESIA:  General endotracheal as well as an interscalene block.   ESTIMATED BLOOD LOSS:  250 mL.   DRAINS:  None.   HISTORY:  Ms. Washburn is a 77 year old female who has had chronic right  shoulder pain with weakness and restrictions in mobility and a known  massive rotator cuff tear with a rotator cuff tear arthropathy.  Due to  her ongoing pain and functional limitation, she is brought to the  operating room at this time for planned right shoulder CTA  hemiarthroplasty.   Preoperatively, I counseled Ms. Allmendinger on treatment options as well as  risks versus benefits thereof.  Possible surgical complications of  bleeding, infection, neurovascular injury, persistent pain, loss of  motion, instability, and possible need for revision surgery, and  potential anesthetic complications were reviewed.  She understands and  accepts and agrees with our planned procedure.   PROCEDURE IN DETAIL:  After undergoing routine preop evaluation, the  patient received prophylactic antibiotics and an interscalene block was  established in the holding area by the Anesthesia Department.  She was  placed supine on the operating table and then once with induction of  general endotracheal  anesthesia.  The right shoulder was positioned with  a small folded towel beneath the scapula and the patient was otherwise  in a semi-reclined to supine position and then the right shoulder girdle  region was sterilely prepped and draped in standard fashion.  A  deltopectoral incision was outlined from the coracoid process distally  to the deltoid insertion.  Skin was divided, total length approximately  20 cm and deltopectoral interval was then identified.  Electrocautery  was used for hemostasis.  The cephalic vein was retracted laterally with  the deltoid and the interval was developed proximally and distally.  The  upper centimeter and half of the pectoralis major tendon was  tenotomized.  There was found to be severe scarring in the  subacromial/subdeltoid bursa and this was divided with a combination of  blunt and sharp dissection.  The rotator cuff was obviously deficient  through the superior half of the humeral head.  The subscapularis was  divided away from the lesser tuberosity and then freed up from the  adhesions and circumferentially mobilized and good elasticity was  regained.  The free margin was intact with a series of #2 FiberWire  sutures.  The self-retaining retractor was then placed.  The anterior  capsule was then divided to further help mobilize the subscapularis.  We  then elevated the humeral head through the incision and further  dissected the capsule away from the anterior margin of the humeral head.  The rotator cuff was obviously deficient and there was eburnation of the  bone on the humeral head where it had been articulated once again to the  surface of the acromion.  We then used the extramedullary guide to  outline the humeral head cut and this was then performed approximately  30 degrees of retroversion using the oscillating saw at appropriate  level.  The humeral head was then taken to backtable and showed a radius  of curvature consistent with a 48-mm  head.  Bone was then harvested for  later fixation of the humeral stem.  We then performed a hand reaming of  the humeral canal and the size 8 showed excellent purchase and good fit.  Initial placement of the size 10 stem showed a very limited entry into  the canal and certainly would not fit and so the size 8 reamer was  chosen as the appropriate size.  We then performed broaching size 6 and  then size 8.  This showed excellent fit across the humeral head cut.  We  then off the stem used the cutting guide for the greater tuberosity and  this was then performed with the oscillating saw.  We then placed the  trial CTA head, 48 x 18.  A reduction was then performed and this showed  good soft tissue balance, good congruency across the joint.  The trials  were then removed.  The canal was irrigated.  We placed a series of bone  tunnels through the lesser tuberosity for later repair of the  subscapularis.  We then took the final stem, introduced it into the  humeral canal, and then all the bone graft was packed proximally and the  implant was terminally seated to the appropriate level.  Excellent  fixation was achieved.  We then took the 48 x 18 head and impacted this  into position.  Final reduction was performed.  The shoulder was taken  through range of motion showing good mobility and good stability.  The  subscapularis was then repaired back to the lesser tuberosity through  the bone tunnels.  The arm then easily achieved 30 degrees of external  rotation without excessive tension on the subscapularis repair.  The  wound was then irrigated.  Hemostasis was obtained.  The deltopectoral  interval was then reapproximated and closed with #1 Vicryl.  We did use  a single #2 FiberWire at the center point of the incision for  demarcation of the deltopectoral interval.  Subcu was closed with 2-0  Vicryl and intracuticular 3-0 Monocryl used to close the skin followed  by Steri-Strips.  A bulky dry  dressing was then taped over the right  shoulder and on her right arm was placed a sling immobilizer.  The  patient was then placed supine, extubated, taken to the recovery room in  stable condition.      Vania Rea. Supple, M.D.  Electronically Signed     KMS/MEDQ  D:  03/19/2008  T:  03/20/2008  Job:  [id number]

## 2011-04-28 NOTE — Procedures (Signed)
NAME:  [name], HERNAN NO.:  [id number]   MEDICAL RECORD NO.:  [id number]          PATIENT TYPE:  OUT   LOCATION:  SLEEP CENTER                 FACILITY:  [facility]   PHYSICIAN:  Marcelyn Bruins, M.D. [facility] DATE OF BIRTH:  03-11-1934   DATE OF STUDY:  09/22/2005                              NOCTURNAL POLYSOMNOGRAM   REFERRING PHYSICIAN:  Dr. Birdie Sons.   DATE OF STUDY:  Dr. Birdie Sons.   INDICATION FOR STUDY:  Persistent disorder of initiating and maintaining  wakefulness.   EPWORTH SCORE:  12.   SLEEP ARCHITECTURE:  The patient has total sleep time of only 217 minutes  with consistent sleep not initiated until after 12 midnight. The patient  never achieved REM or slow wave sleep. Sleep onset latency was prolonged at  85 minutes. Sleep efficiency was very poor at 56%.   RESPIRATORY DATA:  The patient was found to have a 118 hypopneas and 49  apneas for a respiratory disturbance index of 46 events per hour. The events  were not positional but there was moderate snoring noted throughout study.  The patient did not meet split night protocol because most of her events  occurred after 2 a.m.   OXYGEN DATA:  There was O2 saturation as low as 82% associated with the  obstructive events.   CARDIAC DATA:  No clinically significant cardiac arrhythmia.   MOVEMENT/PARASOMNIA:  The patient had very large numbers of leg jerks at 363  events with 8.6 per hour resulting in arousal or awakening. There was no  abnormal behavior noted during the study.   IMPRESSION/RECOMMENDATIONS:  1.  Severe obstructive sleep apnea/hypopnea syndrome with a respiratory      disturbance index of 46 events per hour and O2 desaturation as low as      82%. Treatment for this degree of sleep apnea should focus primarily on      weight loss as well C-PAP. If the patient is intolerant to C-PAP, upper      airway surgery could be considered.  2.  Large numbers of leg jerks with significant sleep  disruption. It is      really unclear whether this is related to the patient's severe sleep      apnea or possibly a secondary sleep disorder such as the restless leg      syndrome or the      periodic leg movement syndrome. Clinical correlation is suggested if the      patient fails to respond clinically to appropriate treatment of her      obstructive sleep apnea.           ______________________________  Marcelyn Bruins, M.D. [facility]  Diplomate, American Board of Sleep  Medicine     KC/MEDQ  D:  09/28/2005 15:32:23  T:  09/28/2005 23:31:11  Job:  [id number]

## 2011-04-28 NOTE — Discharge Summary (Signed)
[place]  Patient:    [name], [name]                      MRN: [id number] Adm. Date:  [id number] Disc. Date: [id number] Attending:  Herold Harms Dictator:   Dian Situ, PA CC:         Vania Rea. Supple, M.D.  Rosanne Sack, M.D.  Duncan Dull, M.D.   Discharge Summary  DISCHARGE DIAGNOSES: 1. Status post right total knee replacement. 2. Postoperative ileus, resolved. 3. Postoperative anemia, stable. 4. Heme-positive stools. 5. Escherichia coli and Klebsiella urinary tract infection. 6. Hypertension.  HISTORY OF PRESENT ILLNESS:  [name] is a 77 year old female with a history of GERD, hypertension, and DJD of the right knee, who elected to undergo right total knee replacement on October 01, 2000, by Vania Rea. Supple, M.D. Postoperative weightbearing as tolerated and on Coumadin for DVT prophylaxis. She did have a drop in her H&H, requiring two units packed red blood cells. Also multiple postoperative issues, including ileus with other slight abnormalities, mental status changes ______.  Juan-Carlos Monguilod, M.D., has been following along for medical issues.  Reglan was added with improvement in her GI status.  She had problems with acute renal insufficiency treated with IV fluids.  Blood pressure medicines are currently being held. She has been making slow progress.  We have consulted for progressive independent goals.  PAST MEDICAL HISTORY:  See discharge diagnoses plus history of depression, GERD, and diverticulosis.  ALLERGIES:  SULFA, ALTACE, and HYDROCHLOROTHIAZIDE.  SOCIAL HISTORY:  The patient lives with family in a one-level home with five steps at entry.  She was independent prior to admission.  She does not use any tobacco or alcohol.  HOSPITAL COURSE:  [name] was admitted to rehabilitation on October 05, 2000, for inpatient therapies to consist of PT and OT daily.   Post admission, she was maintained on Coumadin for DVT prophylaxis.  Bilateral duplex study showed no evidence of DVT.  At the time of discharge, the patient is to continue on Coumadin through the end of the month to complete her DVT prophylaxis.  PT and INR therapeutic at 20.8 and 2.3, respectively.  The patient is discharged on 4 mg a day of Coumadin.  A pro time is to be checked on Tuesday, October 16, 2000, with results to Dr. Duncan Dull office.  The patients blood pressures were monitored on a b.i.d. basis and were noted to be ranging upwards.  Labetalol has slow been increased to home dose.  The Norvasc is currently being held and the patient is aware of this.  The blood pressures at the time of discharge are ranging in systolics from 120s the mid 130s and diastolics 60s to 70s.  Her p.o. intake has been good.  No bowel or bladder issues reported.  She was noted to have some mildly elevated blood sugars on her electrolyte check in the 130-151 range.  Therefore, CBGs were checked on a q.i.d. basis for a few days.  Blood sugars were seen to normalize, ranging from 95-110 by the time of discharge.  Her hyperglycemia is probably secondary to the stress of surgery and also secondary to UTI.  Her urine culture done post admission grew out Klebsiella and Escherichia coli. The patient was started on Tequin on the day of discharge.  She does report a history of vaginal candidiasis with the use of antibiotics and a prescription for Diflucan was also  given.  The patients postoperative anemia has been followed along.  Her H&H were relatively stable on the last check on October 11, 2000.  It showed a hemoglobin of 8.3 and hematocrit 24.6.  Stool guaiacs x 2 were done and the patient was noted to have one positive stool guaiac.  No GI symptoms reported. The patient is to follow up with Duncan Dull, M.D., for her physical in the upcoming weeks to have stool cards x 3 checked at that time.   Check of electrolytes on October 10, 2000, showed sodium 139, potassium 4.5, chloride 101, CO2 29, BUN 11, and creatinine 0.9.  The patient was taken off Reglan during her stay.  No problems with abdominal distention, constipation, or recurrent ileus reported.  The patients knee has healed well without any signs or symptoms of infection.  No drainage or erythema noted.  Staples were discontinued and the area was steri-stripped on postoperative day #11 prior to discharge.  During her stay in rehabilitation, [name] has made good progress.  She has progressed to being modified independent for self-care needs, including clonidine.  She is modified independent for ambulating 50-60 feet with standard walker.  Follow-up therapies in terms of home health PT have been set up through [facility].  Also, a home health R.N. has been arranged for pro times next on Tuesday with the results to Duncan Dull, M.D.  DISPOSITION:  On October 12, 2000, the patient is discharged to home in good condition.  DISCHARGE MEDICATIONS: 1. Coumadin 2 mg two pills p.o. q.p.m. 2. Prilosec 20 mg per day. 3. Labetalol as at home. 4. Trinsicon one b.i.d. 5. Imipramine 50 mg q.h.s. 6. OxyContin CR 20 mg one b.i.d. x 1 week and then decrease to one a day for a    week. 7. Oxycodone IR 5 mg one to two p.o. q.4-6h. p.r.n. pain. 8. Tequin 40 mg per day x 6 days. 9. Diflucan 100 mg per day x 7 days while on Tequin.  ACTIVITY:  To use walker.  WOUND CARE:  Keep area clean and dry.  SPECIAL INSTRUCTIONS:  No alcohol.  No smoking.  No driving.  No aspirin, aspirin products, or NSAIDs.  Home health R.N. to draw a pro time on Tuesday with results to Duncan Dull, M.D.  FOLLOW-UP:  The patient is to follow up with Vania Rea. Supple, M.D., in 7-10 days for recheck.  Follow up with Duncan Dull, M.D., in two to three  weeks for check of UA, blood pressures, and stool cards.  Follow up with Reuel Boom L.  Thomasena Edis, M.D., as needed. DD:  10/12/00 TD:  10/13/00 Job: [id number]

## 2011-04-28 NOTE — Discharge Summary (Signed)
. [facility]  Patient:    [name], [name]                      MRN: [id number] Adm. Date:  [id number] Disc. Date: 10/05/00 Attending:  Cain Sieve Dictator:   Alexzandrew L. Julien Girt, P.A.-C. CC:         Rosanne Sack, M.D.  Daniel L. Thomasena Edis, M.D.   Discharge Summary  ADMISSION DATE:  October 01, 2000 to [facility].  DISCHARGE DATE:  October 05, 2000, to [facility].  ADMISSION DIAGNOSES:  1. Osteoarthritis, right knee.  2. Hypertension.  3. Hiatal hernia.  4. Acid reflux.  DISCHARGE DIAGNOSES:  1. Osteoarthritis right knee status post right total knee replacement     arthroplasty.  2. Acute renal failure postoperatively.  3. Hyperkalemia secondary to acute renal failure.  4. Postoperatively hemorrhagic anemia.  5. Status post transfusion with no sequelae.  6. Postoperative adynamic ileus.  7. Gastroesophageal reflux disease.  8. Esophageal strictures.  9. Anxiety and depression. 10. Obesity. 11. History of diverticulum. 12. Question of a Bartholins cyst. 13. Postoperatively altered mental status secondary to oversedation narcotics.  PROCEDURE:  The patient was taken to OR on October 01, 2000, and underwent a right total knee replacement arthroplasty by Surgeon Dr. Francena Hanly and assistance, Ralene Bathe P.A.C.  Surgery was done under general anesthesia.  COMPONENTS USED:  A #5 femur, #5 tibia, 15 mm polyethylene insert and a 26 mm patella.  Tourniquet time of 98 minutes.  CONSULTS:  Medical.  HOSPITAL INTERNIST:  Rosanne Sack, M.D.  BRIEF HISTORY:  The patient is a 77 year old female who has been seen and evaluated by Dr. Francena Hanly for bilateral knee pain.  She has been having increasing knee pain for some time now.  She reports arthritic pain on a daily basis.  She also started to experience functional limitations where her arthritic pain has started to interfere with  her daily activities.  She is having difficulty with stairs.  She is seen in the office and found to have bilateral tricompartmental osteoarthrosis, however, at this time the right knee is more symptomatic then the left.  It is felt she has reached the point where she could benefit from undergoing surgical intervention.  Risks and benefits of a total knee replacement have been discussed with the patient and she has elected to proceed with surgical intervention.  LABORATORY DATA:  CBC on admission showed a hemoglobin of 11.4, hematocrit 33.8, white cell count 6.2, red cell count 3.94.  Serial H&Hs and CBCs were followed throughout the hospital course.  Postoperative hemoglobin did drop down to a level of 9.5 and a hematocrit of 28.7.  Continued to drop down to a level of 7.6.  Hemoglobin with a crit of 23.1, she was transfused with 2 units of packed cells.  Post transfusion hemoglobin back up to 8.9, hematocrit of 26.5.  Chem panel on admission:  Chem panel all within normal limits.  Follow up BMET, however, on October 02, 2000, showed an elevation of potassium from 4.2 to 5.4, sodium dropped down to 134, glucose increased to 158.  BUN increased from 16 to 25, and creatinine went up from 1.1 to 2.6.  Serial BMETs were followed very closely.  BUN and creatinine did increase later that day up to a level of 31, and 3.0 respectively.  Potassium did increase up to 5.9. She was treated by medical services.  On October 03, 2000,  the BMET showed BUN had decreased back down to a level of 21 within normal limits.  Creatinine decreased back down to a level of 1.5 within normal limits.  Sodium remained around 134.  Potassium also decreased back to within normal limits of 4.6.  On October 04, 2000, BUN and creatinine were 9 and 1.0 respectively with a potassium of 3.9.  Last BMET prior to discharge showed a slight drop in potassium from 3.9 to 3.4.  BUN and creatinine still remain stable at 12  and 0.9 respectively.  Amylase, magnesium, phosphorous and lipase levels were taken on October 04, 2000, secondary to the development of an adynamic ileus.  Amylase was normal at 27, magnesium at 2.1, phosphorus was low at 1.1, lipase was normal at 22.  TSH levels taken on October 02, 2000, was normal at 1.599.  UA on admission was negative.  Blood group type 0+.  Serial pro times were followed throughout the hospital course per Coumadin protocol.  PT and PTT on admission were 13.6, and 25 respectively.  Serum pro times were followed.  Last noted PT and INR showed a PT of 23.3 with an INR of 2.9.  X-RAYS:  X-rays of the right knee on admission taken September 24, 2000, showed osteoarthritis with near complete loss of medial joint space height, patella also with associated patellofemoral degenerative disease.  I do not see an EKG report on this chart.  HOSPITAL COURSE:  The patient was admitted to [facility] and taken to OR on October 01, 2000, and underwent the above stated procedure without complication.  The patient tolerated the procedure well and was later transferred to the recovery room and then to the orthopedic floor for continued postoperative care.  Vitals signs were followed on the patient throughout the hospital course.  The patient had only minimal elevation of temperature postoperatively of 99.4, which was noted on postoperative day 2, otherwise the patient remained essentially afebrile.  The patient was given 48 hours of IV antibiotics.  Total knee protocol was initiated postoperative and she was placed on weightbearing as tolerated status to the right lower extremity.  She was noticed on her postoperative visit the evening of her surgery to be in moderate to severe pain.  She had been using PCA for analgesics for pain control.  She was also noted on postoperative day one to still be in a moderate amount of pain.   On postoperatively day 1 it was noted on her  followup BMET that apparently her potassium had shot up from 4.2 to 5.4.  BUN and creatinine had also increased up to 25 and 2.6 respectively.  Again followup lab showed a continued increase as stated above.  Medical services were consulted.  The patient was seen in consultation by Dr. Elliot Gurney, for her acute renal failure and hyperkalemia.  She was also noted to have some altered mental status due to oversedation.  The hemoglobin only dropped to 9.5 at that time.  She was followed very closely.  She was treated with Kayexalate.  She did respond to treatment and her electrolytes, BUN and creatinine did improve by the following day.  Narcotics were adjusted.  She was also noted at the time of the procedure that she had small cystic swelling around the labia.  This was felt to be a Bartholin cyst.  Medical services did feel that she would probably need followup evaluation on an outpatient basis.  Labs again were followed very closely as stated above.  It was noted that her hemoglobin had dropped down to 7.6.  She was given 2 units of autologous blood.  She responded well.  Her hemoglobin and hematocrit came back up postoperatively to a level of 8.9 and 26.5.  She received a transfusion on postoperative day 2, the date of October 03, 2000.  This was the day that her hemoglobin had been noted to drop down to 7.6.  She had also complained of some abdominal distention.  A KUB was ordered.  It was noted on October 04, 2000, due to the oversedation, use of IV analgesics and also the electrolyte imbalance the patient had developed an adynamic ileus.  The patient was being treated very closely by Dr. Jamie Brookes and received treatment for her ileus. Medications were adjusted.  IV narcotics were stopped and she was encouraged to use p.o. analgesics for her postoperative pain.  The electrolyte imbalance continued to improve with treatment.  Her acute renal failure improved.  The hyperkalemia  came back down with a noted potassium within normal limits.  By October 05, 2000, her mental status had since cleared.  Electrolyte imbalance had been corrected, and she started to move her bowels with improvement of the adynamic ileus, however, her potassium was noted to have dropped slightly from 3.9 to 3.4.  It was decided that the patient be put on K-Phos by mouth due to low phosphorus level and also a low potassium level, however, due to her improvement in her mental status also improvement from her GI standpoint and her improvement from her acute renal failure, it was felt she was medically stable for transfer.  The patient did receive a rehabilitation consult during the hospitalization.  The patient was seen and evaluated during the middle of her hospital course by Dr. Ellwood Dense.  It was felt due to her slow progress with physical therapy that once the patient became medically stable that it would be appropriate for her to attend a short stay in the inpatient rehabilitation.  Again, noted on October 05, 2000, patient improved with her above postoperative problems.  It was felt she was medically stable and it was decided the patient would be transferred over to the [facility] rehabilitation unit for continued orthopedic therapy.  This was discussed with Dr. Jamie Brookes, and he was in agreement.  DISCHARGE PLAN:  The patient will be transferred to [facility] on October 05, 2000.  DISCHARGE DIAGNOSES:  Please see above.  DISCHARGE MEDICATIONS:  1. Peri-Colace 1 tablet p.o. b.i.d.  2. Trinsicon 1 p.o. t.i.d. (currently on hold at time of transfer)  3. Protonix 40 mg p.o. q. day.  4. Imipramine 50 mg p.o. q.h.s.  This was currently on hold at the time of     transfer.  5. Norvasc 5 mg p.o. q.h.s.  This was currently on hold at the time of     transfer.  6. The patient is also utilizing Milk of Magnesia 15 mL p.o. b.i.d.  7. Labetalol newly increased to 300  mg p.o. q.a.m. and 450 mg p.o. q.p.m.  8. Lactulose 30 mg p.o. b.i.d.  9. Laxative of choice. 10. Enema of choice. 11. Tylenol 1 or 2 every 4-6h. as needed for mild pain. 12. Phenergan 25 mg p.o. q.6h. p.r.n. nausea. 13. She is currently on Coumadin protocol as per pharmacy. 14. Restoril 30 mg p.o. q.h.s. 15. Robaxin 500 mg p.o. q.6-8h. p.r.n. spasm. 16. Percocet 1 or 2 every 4-6h. as needed for pain.  Please encourage  to only     use for moderate pain. 17. Xanax 0.25 mg p.o. t.i.d. p.r.n. anxiety.  NEW MEDICATIONS:  She is also placed on:  18. K-phos 2 tabs p.o. b.i.d.  DIET:  Low sodium diet.  ACTIVITY:  Gait training ambulation as per physical therapy while in [facility] rehabilitation unit.  She is weightbearing as tolerated.  No CPM at this time.  Encourage aggressive range of motion, gait training ambulation as per physical therapy, occupational therapy as needed.  FOLLOWUP:  The patient is to followup with Dr. Rennis Chris in 2 weeks from date of surgery or following the discharge from the [facility].  RECOMMENDATIONS:  Due to the electrolyte imbalance it was recommended by Dr. Elliot Gurney, M.D. that we ask the rehabilitation services to follow the patients potassium and electrolytes very closely.  She has a recommended BMET in the a.m. on October 06, 2000.  DISPOSITION:  [facility].  CONDITION ON DISCHARGE:  Slowly improved. DD:  10/05/00 TD:  10/05/00 Job: [id number]

## 2011-04-28 NOTE — H&P (Signed)
NAME:  LOVELL, ROE                       ACCOUNT NO.:  [id number]   MEDICAL RECORD NO.:  [id number]                   PATIENT TYPE:  INP   LOCATION:  NA                                   FACILITY:  [facility]   PHYSICIAN:  Vania Rea. Supple, M.D.               DATE OF BIRTH:  10/23/1934   DATE OF ADMISSION:  01/07/2004  DATE OF DISCHARGE:                                HISTORY & PHYSICAL   Ms. [name] is scheduled for left total knee arthroplasty. She is a  patient well known to our practice. She has failed outpatient conservative  measures. She has undergone right total knee arthroplasty, but unfortunately  had a couple of postoperative complications including a mild stroke of which  she had resolution of any neurologic deficits as well as a pseudomembranous  colitis exacerbation. She has now progressed to where the left knee is  bothering her.  Injections are no longer providing her relief. X-rays have  demonstrated end-stage osteoarthritis. She has been established with another  medical physician, has received medical clearance, and now wishes to  proceed.   PAST MEDICAL HISTORY:  As above.  1. History of CVA and pseudomembranous colitis postoperatively.  2. Osteoarthritis.  3. Diabetes mellitus, type 2.  4. Hypertension.  5. GERD.  6. Urinary incontinence.  7. Left knee osteoarthritis.   PAST SURGICAL HISTORY:  1. Right total knee arthroplasty.  2. Left knee arthroscopy.  3. Left foot surgery.  4. Cataract surgery.  5. TMJ procedure.  6. Bilateral lens implants.  7. Hysterectomy.   ALLERGIES:  ALTACE and ADHESIVE TAPE. She is wearing a couple of adhesive  tapes to see if these bother her skin.   MEDICATIONS:  1. Furosemide 20 mg daily.  2. Labetalol 300 mg b.i.d.  3. Nexium 40 mg daily.  4. Aspirin daily.  5. Clonidine 0.1 mg b.i.d.  6. Multivitamin.  7. Actos 15 mg daily.  8. Effexor XR 75 mg daily.  9. Imipramine 25 mg daily.  10.      Plavix 75 mg  daily.  11.      Verelan 300 mg daily.  12.      K-Dur 20 mEq daily.  13.      Premarin 0.9 mg daily.   SOCIAL HISTORY:  She has a husband available. She drinks occasional alcohol.  She does not smoke. She lives in a two-level home, but can functional in one  level.   FAMILY HISTORY:  Noncontributory.   REVIEW OF SYSTEMS:  The patient denies any recent fevers, chills, or  nightsweats. No bleeding tendencies. CNS: No blurred or double vision. No  seizures, headaches, or paralysis. RESPIRATORY:  No shortness of breath,  productive cough, hemoptysis. CARDIOVASCULAR: No chest pain, angina, or  orthopnea. GI: No nausea, vomiting, constipation, melena, or bloody stools.  GENITOURINARY: She does have some mild urinary incontinence. No dysuria or  hematuria.   PHYSICAL  EXAMINATION:  GENERAL: Well-developed, well-nourished 77 year old  female.  VITAL SIGNS: Blood pressure today is 146/86.  HEENT: Normocephalic. Extraocular motions intact.  NECK: Supple. No lymphadenopathy. No carotid bruit.  CHEST: Clear to auscultation bilaterally with no rales or rhonchi.  HEART: Regular rate and rhythm.  No murmurs, rubs, gallops, heaves, or  thrills.  ABDOMEN: Positive bowel sounds, soft, and nontender.  EXTREMITIES: She has painful left knee range of motion with crepitus noted  and tenderness along the medial and lateral joint lines.  NEUROVASCULAR:  She is intact distally.   IMPRESSION:  Left knee osteoarthritis.   PLAN:  The plan will be for left total knee arthroplasty. Will cautiously  follow her postoperatively and her primary physician is Dr. Harrington Challenger. Will  contact him should any need arise.     Tracy A. Shuford, P.A.-C.                 Vania Rea. Supple, M.D.    TAS/MEDQ  D:  01/04/2004  T:  01/04/2004  Job:  [id number]

## 2011-04-28 NOTE — Discharge Summary (Signed)
[facility]  Patient:    [name], [name]                      MRN: [id number] Adm. Date:  [id number] Disc. Date: [id number] Attending:  Anastasio Auerbach CC:         Jenel Lucks, M.D.  Marlan Palau, M.D.  Barbette Hair. Arlyce Dice, M.D. [facility]  Tinnie Gens C. Ninetta Lights, M.D.   Discharge Summary  DISCHARGE DIAGNOSES:  1. Pseudomembranous colitis, relapsed.     a. Clostridium difficile toxin positive.     b. Failed initial treatment (Flagyl x 10 days).     c. Significant inflammation and prolonged ileus.  2. Acute nonhemorrhagic right frontal cerebrovascular accident November 18, 2000.     a. Tissue plasminogen activator given within 2 to 2-1/2 hours.     b. Mild residual left facial droop.     c. Overall excellent response to tissue plasminogen activator.  3. Dehydration secondary to colitis (nausea, vomiting, diarrhea).  4. Prerenal azotemia secondary to dehydration.     a. Admission BUN 24, creatinine 1.6.     b. Discharge BUN 12, creatinine 0.6.  5. Hypotension on admission secondary to dehydration, resolved.  6. Transient hyperglycemia.     a. Glycohemoglobin 5.8%.  7. Anemia (hemoglobin 9.1, mean corpuscular volume 83).     a. Required transfusion in October 2001, with knee surgery.     b. Depressed secondary to acute illness.     c. Guaiac-negative.  8. Malnutrition, improving.  9. Chronic hypertension.     a. Previously very difficult to control.     b. Significant throat swelling with angiotensin-converting enzyme        inhibitors. 10. Right upper extremity pain.     a. Peripherally inserted central catheter line site.     b. Ultrasound negative for phlebitis or deep vein thrombosis. 11. Borderline left ventricular systolic function.     a. Effusion 50-55% by echo this admission. 12. Gastroesophageal reflux disease. 13. Depression. 14. Anxiety. 15. Status post right total knee replacement October 2001.     a. Postoperative course complicated by  acute renal failure/hyperkalemia.        1. Oversedation, anemia.        2. Ileus. 16. Obesity. 17. Chronic Raynauds syndrome.     a. Antinuclear antibody negative. 18. Diverticular disease. 19. Bilateral knee arthritis. 20. Glaucoma. 21. Cataract.     a. Status post left lens implant. 22. Status post hysterectomy. 23. History of temporomandibular joint surgery. 24. Small Q-waves in lead III, without significance.  DISCHARGE MEDICATIONS:  1. Prilosec 20 mg q.d.  2. Imipramine 50 mg q.h.s.  3. Xanax 0.5 mg 1/2 tablet q.6h. p.r.n. anxiety.  4. (Increased dose) Labetalol 300 mg 1-1/2 tablets twice a day (patient     previously on 1 tablet in the morning and 1-1/2 in the evening.  5. Antivert 25 mg q.8h. p.r.n. vertigo or dizziness.  6. Premarin 1.25 mg q.d. (I did discuss this with Dr. Anne Hahn and, because the     patient is a nonsmoker, we felt that it was safe to put her back on     Premarin).  7. (New) Multivitamin daily.  8. (New) Flagyl 400 mg q.8h. through December 10, 2000 (total 21-day     treatment).  9. (New) Questran 4 g mixed with 4 oz of fluid and take twice a day (one hour     before or six hours after  other medications - approximately 1 p.m. and     7 p.m.:  Dr. Arlyce Dice will instruct on duration of this medication). 10. (New) Aspirin 325 mg q.d. 11. (New) Plavix 75 mg q.d. 12. (New) Clonidine 0.1 mg p.o. b.i.d. (approximately every 12 hours). 13. (New) Lasix 20 mg q.d. 14. (New) Potassium 20 mEq daily. 15. (New) Iron sulfate 325 mg p.o. b.i.d. 16. (New) Norvasc 5 mg q.d. 17. (New) Hydrocortisone cream 1% to the inner arm with the tape has irritated     the skin b.i.d. p.r.n. 18. Tylenol 500 mg 2 pills q.4h. p.r.n. pain (maximum 8 per day).  CONDITION ON DISCHARGE:  Stable.  [name] is tolerating a diet, and stool frequency is diminishing.  RECOMMENDED ACTIVITY:  As tolerated.  RECOMMENDED DIET:  Small frequent low fat, low salt meals, and drink plenty  of water.  SPECIAL INSTRUCTIONS: 1. The patient is to contact Dr. Anne Hahn if she has any further neurologic    symptoms or problems. 2. The patient is to call Dr. Arlyce Dice if she has any more stomach problems. 3. The patient is to contact myself, Dr. Sheppard Penton, if there are any questions    about her blood pressure medicines that come up before her appointment with    Dr. Shelva Majestic.  FOLLOW-UP: 1. The patient is to keep her previously scheduled appointment with Dr. Jenel Lucks on December 12, 2000.  She should bring in her discharge instructions,    and I will be forwarding a copy of the discharge summary to Dr. Shelva Majestic.  I    did leave a message with her nurse. 2. The patient has an appointment to see Dr. Arlyce Dice on December 25, 2000, at    8:45 a.m. 3. The patient is to call Dr. Lesia Sago, neurologist, to schedule an    appointment for follow-up in three to four weeks.  CONSULTATIONS: 1. Lesia Sago, M.D. 2. Melvia Heaps, M.D. 3. Meade Maw, M.D. 4. Enedina Finner, M.D.  PROCEDURES: 1. Head CT (November 18, 2000):  Atrophy.  Small-vessel disease.  No acute    abnormality. 2. TPA administration (November 18, 2000). 3. A 2-D echo (November 19, 2000):  Overall left ventricular systolic function    was at the lower limits of normal.  Estimated ejection fraction 50-55%.  LV    wall thickness mildly increased.  Increased relative contribution of atrial    contraction to left ventricular filling.  Abnormal left ventricular    relaxation.  Paradoxical motion of the intraventricular septum.  Aortic    valve thickness mildly increased.  Mild mitral valvular regurgitation. 4. Carotid Dopplers (November 19, 2000):  No internal carotid artery stenosis,    vertebral flow antegrade.  5. PICC line placement (December 11, removed November 30, 2000). 6. Brain MRI (November 20, 2000):  Acute/subacute right posterior frontal CVA    in the right middle cerebral artery distribution.  Negative MRA. 7.  Abdominal and pelvic CT (November 19, 2000):  Significant ascites and     pancolitis.  Right pleural effusion and right lower lobe atelectasis.  8. Abdominal and pelvic CT (November 23, 2000):  Decreasing colonic wall     thickness and ascites.  9. Chest x-ray (November 29, 2000):  Improved right basilar aeration, and     decreased pleural effusion. 10. Right arm venous Doppler (December 01, 2000):  No evidence of DVT or     superficial thromboses. 11. EKG:  Normal sinus rhythm with small Q-waves in lead  III, related to     improper loop placement.  Repeat EKG with resolution.  HOSPITAL COURSE: #1 - PSEUDOMEMBRANOUS COLITIS:  Mrs. Stantz is a 77 year old Caucasian female who underwent right total knee replacement in October 2001, and was exposed to antibiotics at that time.  She presented to [facility] on October 22, 2000, with C. difficile colitis and was treated with 10 days of Flagyl.  She presents this admission with nausea, vomiting, and diarrhea, as well as abdominal pain.  She is hypotensive secondary to volume depletion and demonstrates a leukocytosis.  She was empirically started on oral Flagyl, and C. difficile was collected, which ultimately becomes positive.  Over the first two days of her hospital stay she was slow to improve but was not worsening from a gastrointestinal standpoint.  On November 18, 2000, she developed acute onset of dysarthria and left-sided weakness (discussed below).  Over the next several days she had difficulty taking pills and had to be moved to the intensive care unit for other reasons and progressed with a significant ileus and colonic mucosal inflammation.  She had to be maintained on IV fluids for hydration and eventually had to go on TNA for a period of time because of significant malnutrition.  Ultimately, her symptoms did improve from her colitis and, at the time of discharge, she was tolerating small, frequent meals and was still having  approximately five to seven stools per day, although not terribly loose.  She will go home to complete a 21-day course of Flagyl.  During her stay here she was put on vancomycin as well as rifampin briefly, but these were stopped and she continued to have improvement.  Flagyl was chosen over vancomycin because of the increased incidence of VRE with the latter.  She will follow up with Dr. Arlyce Dice, who has her on Questran as well. She has been instructed to call or return if she has worsening diarrhea, fevers, nausea, vomiting, or other problems.  #2 - ACUTE NONHEMORRHAGIC RIGHT FRONTAL CEREBROVASCULAR ACCIDENT: Mrs. Gilkey was noted to have left-sided weakness as well as dysarthria on November 18, 2000, at approximately 12:30.  It was clear she was having some sort of a CNS event.  Dr. Anne Hahn was promptly consulted.  Stat head CT revealed no evidence of bleeding, and discussions were had about TPA.  The risks and benefits were explained to the patient and her family, and she agreed to undergo treatment.  TPA was given at approximately 2:30.  It is important to note that she had been guaiac-negative on two to three occasions on this admission already.  She was moved to the intensive care unit by protocol for the administration of TPA.  Ultimately she had a very good response to the medication.  Her left-sided weakness completely resolved, as did her dysarthria.  She was left with a mild left facial droop.  We did investigate possible etiology.  Carotid Doppler showed no evidence of stenosis.  A 2-D echo showed no evidence of embolic source.  MRA showed no evidence of significant intracranial disease.  She was placed on aspirin and Plavix.  She will likely need to continue these indefinitely.  I did resume her Premarin at the time of discharge after discussing this with Dr. Anne Hahn. He felt it was reasonable in this nonsmoking patient.  The patient did not require any home physical therapy at  the time of discharge, and she will follow up with Dr. Anne Hahn in three to four weeks.  #3 -  CHRONIC UNCONTROLLED HYPERTENSION:  Mrs. Eick has had particularly difficult-to-control blood pressures in the past.  She comes into the hospital on labetalol alone.  Of course, her blood pressure was quite low initially because of the problems listed above; however, several days into her stay she was recovering, and her blood pressure went back up again.  She ended up being placed on clonidine and had her Normodyne increased.  In addition to that, she was placed on Lasix and potassium and, subsequently, Norvasc.  We chose not to push her Normodyne up any higher because of borderline heart rates.  She had been tried on an ACE inhibitor, and this resulted in significant edema of the throat, according to the patient.  It would not be unreasonable to increase the Norvasc and taper off the clonidine in order to minimize her medications. We will let Dr. Shelva Majestic decide about this.  Obviously, in light of her CVA, blood pressure control is going to be very important.  #4 - ANEMIA:  Mrs. Nowland was anemic from her total knee replacement in October 2001, and actually required blood transfusions then.  Her hemoglobin here stayed between 9 and 10 and, at the time of discharge, was 9.1 with an MCV of 83.  She was guaiac-negative.  I suspect these drops were due to her acute illnesses, and we did continue her iron upon discharge.  Would recommend following up and is still low, considering iron studies and other serum anemia workup.  Again, she was guaiac-negative on at least three to four occasions in the hospital here.  LABORATORY DATA:  This admission, TSH 2.78, antiphospholipid antibodies and lupus anticoagulant negative.  ANA negative.  LABORATORY DATA:  On discharge, hemoglobin 9.1, MCV 83, WBC 4000, platelet count 197.  Sodium 137, potassium 3.8, chloride 105, bicarbonate 28, BUN 12, creatinine 0.6,  albumin 2.2.  A fasting morning blood sugar on the day of discharge 98.   Discharge weight 192. DD:  12/02/00 TD:  12/04/00 Job: [id number]

## 2011-04-28 NOTE — Consult Note (Signed)
[facility]  Patient:    [name], [name]                      MRN: [id number] Proc. Date: 11/19/00 Adm. Date:  [id number] Attending:  Anastasio Auerbach CC:         Carola J. Gerri Spore, M.D.   Consultation Report  REASON FOR CONSULTATION:  New Q-wave in V3 and elevated troponin levels.  PRIMARY PHYSICIAN:  Carola J. Gerri Spore, M.D.  HISTORY:  Lynell Greenhouse is a 77 year old female with history of hypertension, diverticulitis, and depression.  She was initially admitted on 11/16/00 for tachycardia, dehydration, hypertension and continual diarrhea.  The patient was subsequently treated for C-difficile colitis, prerenal azotemia, anemia, and tachycardia.  The tachycardia was felt to be secondary to beta blocker withdrawal as well as for infectious etiology.  On 11/18/00, the patient was noted to have an acute right brain stroke.  She was a candidate for TPA that was given.  The patient was transferred to the unit for further evaluation. On 11/19/00, the patient was noted to have a new Q in V3; serial cardiac enzymes were obtained.  The patient had normal CKs but borderline elevation of troponin.  REVIEW OF SYSTEM:  The patient has had no chest pain, no shortness of breast. Complaints have been primarily limited to weakness and fatigue.  PAST MEDICAL HISTORY: 1.   Significant for a pseudomembranous colitis.  She was given 10 days of      Flagyl and sent home from a hospital admission on November 17.  Four days      prior to admission, she had ongoing dizziness and diarrhea and was      readmitted for pseudomembranous colitis. 2.   Hypertension. 3.   GE reflux disease. 4.   Degenerative joint disease. 5.   Diverticulosis. 6.   Left TMJ disease. 7.   Anxiety.  PAST SURGICAL HISTORY:   Significant for right knee replacement.  CURRENT MEDICATIONS: Piperacillin, tazobactam, Flagyl, Lomotil p.r.n., Tylenol p.r.n., Xanax p.r.n. Xanax is currently on hold.  Ativan p.r.n.  Patient is currently receiving labetalol p.r.n. per TPA protocol.  ALLERGIES:  ACE INHIBITORS.  SOCIAL HISTORY:  The patient is retired.  She does not smoke or use alcohol. She lives with her husband.  She attends church regularly and has a strong religious background.  FAMILY HISTORY:  Parents have hypertension and CVA.  Grandparents with diabetes.  REVIEW OF SYSTEMS:  Difficult to obtain secondary to the patients dysarthria. She has had some urinary spasm and recently evaluated by infectious disease for UTI.  Continues to have fatigue and weakness.  No significant shortness of breath.  Continues to have some diarrhea of stool.  She also complains of sore throat, sore mouth, and generalize fatigue from enforced bedrest.  PHYSICAL EXAM:  VITAL SIGNS:  Her blood pressure has ranged from 130 systolically to 160-180 over a diastolic of 90 to 110.  Heart rate has ranged from 90 to 120. She has been afebrile.  O2 saturation has been more than 99% on 1 liter nasal cannulae.  GENERAL:  She is dysarthric, lying in bed, appears to be alert and oriented times three.  HEENT:  Unremarkable.  There is no neck vein distention.  Good carotid upstrokes.  No carotid bruits are noted.  PULMONARY:  Reveals decreased breath sounds in the right lung base with some crackles noted.  No use of accessory muscle.  CARDIOVASCULAR:  Reveals a regular rate and rhythm.  Normal S1, normal S2, no rubs, murmurs, or gallops noted.  Tachycardic.  ABDOMEN:  Soft, mild tenderness, no significant guarding, no unusual bruits or pulsations are noted.  EXTREMITIES:  No clubbing, cyanosis, or edema noted.  Distal pulses are weak and unable to palpate on left DP.  SKIN:  Warm and dry.  LABORATORY AND ACCESSORY DATA:  Reviewed.  The patient has a white count of 25, hematocrit of 45 with a platelet count of 353, there is a lead shift.  INR is 1.3,  creatinine is 1.6 with a BUN of 29, potassium is  3.9.  Normal CK. troponin I is borderline elevated at 0.10.  Chest x-ray reveals a right sided pleural effusion which appears to be increasing.  EKG reveals a normal sinus rhythm.  ECG at 7:00 a.m. on December 10 revealed a Q wave in V3 but no Qs in V1, V2.  She has nonsignificant Qs in the inferior leads.  Repeat EKG revealed normalization of the Q wave in V3.  CT of the head on 12/10 revealed no infarct, masses, or hemorrhage.  IMPRESSION: 58. A 77 year old female with Q-wave in V3 which is most likely      related    to improper loop placement.  Repeat EKG shows resolution of the Q wave.    Troponin I is nonspecific in the absence of chest pain or other ischemic    signs.  Would not proceed with further testing. 2. Sinus tachycardia is multifactorial.  Most likely related to beta blocker    withdrawal, dehydration, and infectious etiology.  Agree with continuation    of labetalol and treatment of her infectious disease. 3. Hypertension. Labetalol p.r.n. as per the TPA protocol. 4. Right sided pleural effusion appears to be increased upon chest x-ray    today.  Etiology is unclear.  May be related to an infectious process.    Further treatment per your review. No further cardiac workup is indicated.    Please call me should you have further questions.  DD:  11/19/00 TD:  11/19/00 Job: [id number]

## 2011-04-28 NOTE — H&P (Signed)
[place]  Patient:    [name], [name]                      MRN: [id number] Adm. Date:  [id number] Attending:  Cynda Familia CC:         Duncan Dull, M.D., [facility], [address] [place], [place]             Barbette Hair. Arlyce Dice, M.D. [facility]  Vania Rea. Supple, M.D.   History and Physical  ADMISSION DIAGNOSES:  1. Nausea, diarrhea, fatigue, and dehydration; possible pseudomembranous     colitis.  2. Anemia.  3. Over-anticoagulation with Coumadin (INR 4.0).  4. History of hypertension.  5. Guaiac positive stools.  CHIEF COMPLAINT: Diarrhea and weakness.  HISTORY OF PRESENT ILLNESS: [name] is a 77 year old Caucasian female who was discharged from [facility] about nine days ago after a right knee replacement (degenerative joint disease).  Per the patient she required about four units of transfused blood and was discharged home on iron because she remained anemic.  She was also discharged on Levaquin.  She felt well for about three days but then developed diarrhea and slight nausea.  Diarrhea became progressively profuse and was quite dark.  She never saw blood.  The nausea remained mild but prevented her from eating or drinking much.  She experienced progressive malaise and fatigue, eventually leading her to come to the [facility] Emergency Department.  She had recently been interacting with Dr. Shaune Pollack regarding her Coumadin.  She says that yesterday her Coumadin was held and today she was told to take only 2 mg q.d. (she was on 4 mg q.d. prior to yesterday).  She has had no urine output for two days.  There has been some abdominal cramping that is alleviated by passage of stool.  PAST MEDICAL HISTORY:  1. Gastroesophageal reflux disease, followed by Dr. Arlyce Dice.  2. Degenerative joint disease and recent right knee replacement performed by     Dr. Rennis Chris.  3. Anxiety.  4. History of  depression.  5. Left temporomandibular joint disease.  6. Hypertension.  MEDICATIONS:  1. Coumadin 2 mg q.d. (recently decreased from 4 mg q.d.).  2. Prilosec 20 mg q.d.  3. Labetalol 300 mg b.i.d. (taking an additional 300 mg in the afternoon if     her blood pressure is high).  4. Lasix 20 mg b.i.d.  5. Iron supplementation b.i.d.  6. Imipramine 50 mg each evening.  7. OxyContin IR 5 mg p.r.n. (she says she is taking it about once daily).  8. Premarin 1.25 mg q.d.  9. Alprazolam 0.25 mg q.8h p.r.n.  ALLERGIES: ACE INHIBITORS cause edema.  SOCIAL HISTORY: She is retired.  No tobacco use.  Rare alcohol use.  She denies recent ingestion of spoiled foods, seafood, or any unusual food.  FAMILY HISTORY: Parent with hypertension and cerebrovascular accident. Grandparent with diabetes.  REVIEW OF SYSTEMS: She complains of mild nausea but no vomiting or hematemesis.  Profuse dark diarrhea but no bright red blood per rectum.  Mild abdominal cramping.  No headache or neck pain.  No trouble with hearing, vision, chewing, or swallowing.  No nosebleeds.  No abnormal bruising.  No chest pain, shortness of breath, or wheezing.  No genitourinary symptoms.  She has discomfort at the right knee but otherwise no significant localized joint pain.  No problems with mental status.  All other systems negative.  PHYSICAL  EXAMINATION:  VITAL SIGNS: Temperature 100.0 degrees, blood pressure 90/51, heart rate 91, respirations 20.  GENERAL: The patient is not ill appearing.  HEENT: Tympanic membranes clear.  Eyes without erythema or discharge.  EOMI. PERRL.  Nose clear.  Oropharynx clear.  Mucous membranes moist.  NECK: Supple, without lymphadenopathy, tenderness, or thyromegaly.  CHEST: Clear to auscultation bilaterally.  Normal respiratory effort.  HEART: Regular without murmur.  ABDOMEN: Soft, with moderate discomfort to deep palpation in the left lower quadrant and very slight discomfort to  palpation of the suprapubic region.  No rebound or guarding.  No masses or obvious organomegaly.  RECTAL: Examination performed by ER physician showed no mass or rectal tenderness.  Stool was dark and Hemoccult positive.  BREAST/GU: Examinations not performed.  MUSCULOSKELETAL: She has a well-healing longitudinal surgical scar anterior to her right knee.  No edema.  No joint swelling.  SKIN: Without rash.  NEUROLOGIC: Mental status shows appropriate mood, affect, and insight; alert. Normal coordination.  LABORATORY DATA: WBC 7.1, hemoglobin 10.9, platelets 374,000.  Pro time 28.9, INR 4.1.  Urinalysis normal.  ASSESSMENT:  1. Nausea, diarrhea, fatigue, and dehydration.  Her recent course of     antibiotics suggests the possibility of pseudomembranous colitis.  She     had received nearly two liters of fluid prior to my arrival to the     emergency room. She is very weak and hospital observation overnight with     intravenous fluid supplementation is justified.  Both she and her family     feel more comfortable with her staying in the hospital overnight.  2. Recent right knee replacement.  3. Anemia.  At this point I do not know the value of her hemoglobin upon     discharge from the hospital.  She was anemic at that time.  It is not     clear whether her current level of anemia is only secondary to recent     surgical loss or whether she has had substantial gastrointestinal blood     loss.  4. Over-anticoagulation with Coumadin.  Her dosage was recently lowered (held     dose yesterday).  5. Hypertension.  Not currently hypertensive.  PLAN: I am placing [name] under 24 hour observation.  She is being tested for stool culture and Clostridium difficile toxin.  Overnight she will receive intravenous fluids (D5 normal saline).  Once urine output is established she could receive supplemental potassium.  I am starting Cipro and metronidazole  to cover infectious diarrhea  including the possibility of pseudomembranous colitis.  We will follow her CBC, electrolytes, and pro time.  Tonight I am holding her Coumadin and it can be restarted once her over-anticoagulation is resolved.  Her antihypertensive medications will initially be held until she is rehydrated.  [facility] hospitalists will assume care in the morning. DD:  10/22/00 TD:  10/22/00 Job: [id number]

## 2011-04-28 NOTE — H&P (Signed)
[facility]  Patient:    [name], [name]                      MRN: [id number] Adm. Date:  [id number] Attending:  Cain Sieve Dictator:   Alexzandrew L. Julien Girt, P.A.-C. CC:         Duncan Dull, M.D.   History and Physical  CHIEF COMPLAINT:  Right knee pain.  HISTORY OF PRESENT ILLNESS:  The patient is a 77 year old female who has been seen and evaluated by Vania Rea. Supple, M.D. for bilateral knee pain. She has had increasing knee pain for some time now. She has started to complain of daily arthritic pain. The right knee is more symptomatic at this time than the left. She has been seen in the office, treated conservatively for a bilateral knee osteoarthritis. She states at this time her knee pain has increased to the point where it has started to interfere with her daily activities. It is felt she has reached the point where she could benefit from undergoing a total knee replacement. Risks and benefits of this procedure have been discussed at length with her. She has elected to proceed with surgery.  ALLERGIES:  ALTACE causes swelling of the throat, tongue, and face.  CURRENT MEDICATIONS: 1. Labetalol 300 mg p.o. q.a.m., 150 mg midday, 450 mg in the evening. 2. Furosemide 20 mg p.o. q.a.m. and p.m. 3. Norvasc 5 mg p.o. q.p.m. 4. Premarin 0.125 mg q.d. 5. Cod liver oil one q.d. 6. Calcium with zinc one q.d. 7. Xanax 0.25 mg one t.i.d. p.r.n. 8. Prilosec 20 mg q.d. 9. Imipramine 50 mg q.d.  PAST MEDICAL HISTORY:  Hypertension, osteoarthritis, hiatal hernia with reflux disease, esophageal strictures, glaucoma.  PAST SURGICAL HISTORY:  Left foot surgery, left knee surgery, TMJ surgery, glaucoma with lens implant left eye, and hysterectomy.  SOCIAL HISTORY:  The patient is married. Has two daughters. She had one infant that is deceased. Smoking history:  10 to 15 year, two pack per day history, quit approximately 15 to 20 years ago.  Occasional intake of alcohol.  FAMILY HISTORY:  Mother deceased age 82 with Alzheimers dementia and malnutrition. Medical history of father is unknown.  REVIEW OF SYSTEMS:  GENERAL:  No fevers, chills, or night sweats. NEUROLOGICAL:  The patient has had some intermittent lightheadedness. She attributes some of this change is possibly some of her medications. No seizures, syncope, or paralysis. RESPIRATORY:  No shortness of breath, productive cough, or hemoptysis. CARDIOVASCULAR:  No chest pain, angina, or orthopnea. GI:  No nausea, vomiting, diarrhea, constipation. GU:  No discharge, dysuria, or hematuria. MUSCULOSKELETAL:  Pertinent to that of the right knee.  PHYSICAL EXAMINATION:  GENERAL:  The patient is a 77 year old female well-nourished, well-developed appears to be in no acute distress. She is alert, oriented, and cooperative at the time of the exam.  VITAL SIGNS:  Blood pressure 120/82, pulse 88, respirations are 20.  HEENT:  Normocephalic, atraumatic. Pupils are round and reactive. Oropharynx is clear.  NECK:  Supple. No carotid bruits are appreciated.  CHEST:  Clear to auscultation and percussion. No rhonchi or rales.  HEART:  Regular rate and rhythm.  ABDOMEN:  Soft, nontender abdomen. Bowel sounds present. No rebound or guarding.  GENITALIA/RECTAL/BREASTS:  Not done. Not pertinent to present illness.  EXTREMITIES:  Tender to palpation over the medial joint line. Pulses noted to be intact. Nearly full extension, 130 degrees of flexion.  LABORATORY DATA:  X-rays show end-stage  osteoarthrosis.  IMPRESSION: 1. Osteoarthrosis bilateral knees, right symptomatic more so than the left. 2. Hypertension. 3. Hiatal hernia. 4. Gastroesophageal reflux disease. 5. Esophageal strictures. 6. Status post esophageal dilatation procedure. 7. History of glaucoma.  PLAN:  The patient will be admitted to [facility] to undergo right total knee replacement and  arthroplasty. Surgery will be performed by Vania Rea. Supple, M.D. The patient has donated 2 units of autologous blood in preparation for the up and coming surgery. DD:  10/05/00 TD:  10/05/00 Job: [id number]

## 2011-04-28 NOTE — Op Note (Signed)
NAME:  [name], [name] NO.:  [id number]   MEDICAL RECORD NO.:  [id number]          PATIENT TYPE:  OIB   LOCATION:  5034                         FACILITY:  [facility]   PHYSICIAN:  Vania Rea. Supple, M.D.  DATE OF BIRTH:  1934-03-02   DATE OF PROCEDURE:  DATE OF DISCHARGE:                                 OPERATIVE REPORT   PREOPERATIVE DIAGNOSIS:  1. Chronic left should impingement syndrome.  2. Left shoulder rotator cuff tear.  3. Left shoulder AC joint arthropathy.   POSTOPERATIVE DIAGNOSIS:  1. Chronic left should impingement syndrome.  2. Left shoulder rotator cuff tear.  3. Left shoulder AC joint arthropathy.  4. Complex degenerative labial tear.  5. Mild left shoulder adhesive capsulitis.   PROCEDURE:  1. Left shoulder examination under anesthesia.  2. Left shoulder manipulation under anesthesia.  3. Left shoulder diagnostic arthroscopy.  4. Debridement of complex and extensive degenerative labial tear.  5. Arthroscopic subacromial decompression and bursectomy.  6. Arthroscopic distal clavicle resection.  7. Arthroscopic rotator cuff repair utilizing a double row suture bridge      repair construct.   SURGEON:  Vania Rea. Supple, M.D.   Threasa HeadsFrench Ana A. Shuford, P.A.-C.   ANESTHESIA:  General endotracheal.   ESTIMATED BLOOD LOSS:  Minimal.   DRAINS:  None.   HISTORY:  The patient is a 77 year old female who has had chronic left  shoulder pain with some limitation in motion.  With an examination she had a  positive impingement sign as well as pain and weakness with testing of the  rotator cuff.  Perioperative x-rays and MRI scanning confirmed a relatively  large full thickness and mildly retracted tear of the rotator cuff as well  as AC joint arthropathy.  Due to her ongoing pain and functional limitation  she was brought to the operating room at this time with plan of left  shoulder arthroscopy with planned rotator cuff repair.   Preoperatively  discussed with the patient the treatment options as well as  risks versus benefits thereof.  Possible surgical complications bleeding,  infection, neurovascularly injury, persistent pain, loss of motion, and  unseen complications, reoccurrence of rotator cuff tear and possible need  for additional surgery were reviewed.  She understands, accepts and agrees  to the planned procedure.   PROCEDURE IN DETAIL:  After undergoing routine preoperative evaluation, the  patient received prophylactic antibiotics.  An attempt was made at an  interscalene block but the anatomy did not allow proper placement of the  anesthetic agents so the block was not performed.  The patient did receive  prophylactic antibiotics, transferred to the operating room and placed  supine on the operating table and underwent induction of general  endotracheal anesthesia.  Turned to the right lateral decubitus position on  the bean bag and appropriately padded.  Left shoulder examination under  anesthesia revealed some mild restrictions in abduction of forward elevation  approximately 120 degrees.  A gentle manipulation was performed with  palpable and audible release of adhesions.  Ultimately achieved with  approximately 170 degrees of abduction and forward elevation was achieved.  The left arm was then suspended at the 70/30 position with 10 pounds of  traction.  The left shoulder girdle region was sterilely prepped in standard  fashion.  Posterior portal stab was sent to the glenohumeral joint and  anterior portal stab was entered under direct visualization.  The  glenohumeral articular surfaces were in good condition.  Labrum shows  significant degenerative tearing over the anterior superior and posterior  aspects and these areas were all debrided with a shaver.  There was an  obvious full thickness and moderately retracted tear of the rotator cuff.  The biceps tendon was intact and showed good caliber and a stable  attachment  to the glenoid.  The arthroscope was then redirected into the subacromial  space and the arm was dropped down to 30 degrees of abduction.  The free  margins of the rotator cuff was debrided back to healthy appearing tissue  and a subacromial bursectomy was performed removing multiple adhesions  within the subacromial/subdeltoid bursa.  The Arthrex wand was then used to  obtain hemostats and to remove the periosteum from the inner surface of the  anterior half of the acromion.  A subacromial decompression was then  performed _____________ morphology.  A portal was then established directly  anterior to the distal clavicle.  The distal clavicle resection was then  performed with a burr.  Care was taken to make sure the entire circumference  of the distal clavicle could be visualized to ensure adequate removal of  bone.  The subacromial/subdeltoid bursectomy was then completed.  The  rotator cuff was immobilized on both superior and inferior aspect and the  grasper was used to confirm the rotator cuff margin was appropriately  mobilized.  The residual soft tissue at the tuberosity was then removed at  the tear site and then the Arthrex wand was used to debride this area and  then the burr was used to bring this region to a bleeding bony base.  An  accessory portal of __________ was then established through a stab wound off  the lateral margin of the acromion placing Arthrex bicortical suture anchor.  The three limbs of the suture anchor were then passed through the adjacent  margin of the rotator cuff utilizing Mitek suture retriever and a horizontal  mattress construct.  These were tied with sliding locking knots followed by  multiple overhand throws on alternating posts.  We then created a suture  bridge with two Arthrex push lock suture anchors nicely reinforcing repair  and compressing the right margin of the rotator cuff against the bony bed on the greater tuberosity.  Final  bursectomy was then completed.  Fluid and  instruments were then removed.  The portals were closed with Monocryl and  Steri-Strips.  A bulky dry dressing was taped to the left shoulder and the  left arm was placed in sling immobilizer.  The patient was placed supine,  extubated and taken to the recovery room in stable condition.      Vania Rea. Supple, M.D.  Electronically Signed     KMS/MEDQ  D:  06/28/2006  T:  06/29/2006  Job:  [id number]

## 2011-04-28 NOTE — Assessment & Plan Note (Signed)
[facility]                                   ON-CALL NOTE   ADRYAN, DRUCKENMILLER                      MRN:          [id number]  DATE:07/06/2006                            DOB:          [birth year]    Patient of Dr. Cato Mulligan.  Phone call was at 10:50 p.m.  Phone number [phone].   Ms. Perlstein just recently had rotator cuff surgery, had stopped her aspirin  and Plavix for the surgery, and has restarted it a couple of days ago.  She  did have a fall and has noted some significant bruising in her face related  to that, seems to have no major mental status changes that I can tell, but  she is concerned that she is bleeding internally and wonders if she should  go to the emergency room.   PLAN:  I told her if she has a sense of bleeding internally and is bruised  badly on her head, then she should definitely have her husband bring her to  the emergency room now, which is what she was going to do.                                   Karie Schwalbe, MD   RIL/MedQ  DD:  07/06/2006  DT:  07/07/2006  Job #:  [id number]   cc:   Valetta Mole. Swords, MD

## 2011-04-28 NOTE — Op Note (Signed)
NAME:  [name], [name]                       ACCOUNT NO.:  [id number]   MEDICAL RECORD NO.:  [id number]                   PATIENT TYPE:  INP   LOCATION:  X001                                 FACILITY:  [facility]   PHYSICIAN:  Vania Rea. Supple, M.D.               DATE OF BIRTH:  03/31/1934   DATE OF PROCEDURE:  01/07/2004  DATE OF DISCHARGE:                                 OPERATIVE REPORT   PREOPERATIVE DIAGNOSIS:  End-stage left knee osteoarthrosis.   POSTOPERATIVE DIAGNOSIS:  End-stage left knee osteoarthrosis.   PROCEDURE:  1. Cemented left DuPuy rotating platform total knee arthroplasty.  2. Posterior stabilized with #3 femur, a 2.5 tibia, 35 mm patella and 12.5     mm thick posterior stabilized polyethylene insert.   SURGEON:  Vania Rea. Supple, M.D.   Threasa HeadsFrench Ana A. Shuford, P.A.-C.   ANESTHESIA:  Spinal.   TOURNIQUET TIME:  1 hr 13 min.   ESTIMATED BLOOD LOSS:  150 cc.   DRAINS:  Hemovac x1.   HISTORY:  [name] is a 77 year old female who has had long-standing  difficulties with left knee pain and mechanical symptoms, with known end-  stage osteoarthrosis.  She is status post a right total knee arthroplasty in  2001 and subsequently has done well; but, she did have a very difficult  postoperative course, complicated by acute renal failure, pseudomembranous  colitis and a stroke in the remote postoperative period.  She has  subsequently recovered from a medical standpoint.  She is now having  significant pain and functional limitations secondary to left knee.  She has  elected to proceed with left total knee arthroplasty.   Preoperatively I have counseled [name] on treatment options, as well as  risks versus benefits thereof.  The possible surgical complications include:  infection, neurovascular injury, DVT, PE, persistent pain, loss of motion,  anesthetic complications, possible need for revision.  These were all  reviewed; the patient understands and  accepts and would like to proceed with  surgery.   PROCEDURE IN DETAIL:  After undergoing routine preoperative evaluation, the  patient was brought to the operating room.  She received prophylactic  antibiotics.  In a sitting position, she had a spinal anesthetic placed.  Once again placed supine with a Foley catheter passed.  Tourniquet was  applied to the left thigh and the left leg was sterilely prepped and draped  in standard fashion.  The leg was exsanguinated with the tourniquet inflated  to 350 mmHg.   An anterior midline incision was then made from approximately four  fingerbreadths above the patella, to just medial to the tibial tubercle (a  total length of approximately 20 cm).  Skin flaps were mobilized medially  and laterally.  A medial parapatellar arthrotomy was performed and the  patella was everted, and approximately half of the fat pad was excised.  We  did perform a medial release to help  correct her varus deformity, and we  performed complete medial and lateral meniscectomies.  We also removed  remnants of the anterior and posterior cruciate ligaments.   The knee was then flexed up, with a drill used to gain access into the  femoral canal.  Then an intramedullary guide was then passed.  We made a 5-  degree valgus cut across the distal femur, removing 11 mm of bone.  The  distal femur was then sized and the size 3 had the best fit.  This was then  appropriately marked and the size 3 femoral cutting guide was then placed.  We made the anterior, posterior and chamfer cuts on the distal femur.   The proximal tibia was then exposed, and utilizing an extramedullary guide  we made a neutral cut by removing 10 mm of bone as measured from the lateral  tibial plateau.  This corrected the varus deformity nicely.  Care was taken  to protect the collateral ligaments and posterior structures during removal  of the proximal tibia.   At this point trial implants were then placed,  and as the knee was taken  through a range of motion we did find good soft tissue balance.  The size  2.5 tibial tray was chosen, due to the appropriate fit on the proximal  tibia.  This was then appropriately located.  Then the reamer and broach  were then passed for the stabilization keel of the tibial implant.   Our attention was then redirected to the distal femur, where the femoral  notch cut was then made utilizing the appropriate guide.  At this point we  then used a curved osteotome to remove osteophytes from the posterior  borders of the medial and lateral femoral condyles; in addition, several  very large osteoarticular loose bodies were removed from the posteromedial  compartment.   Our attention was then turned to the patella, which was then properly  exposed and an oscillating saw was used to remove approximately 9 mm of  bone.  Sizing showed the 35 mm patella button to have the best fit.  The  stabilizing drill holes were then drilled.  At this point pulsatile lavage  was then performed and the joint was meticulously cleaned.  All residual  bony debris and osteophytes were trimmed with a rongeur.   Cement was then mixed at the back table, and after the appropriate  consistency the implants were cemented into position -- beginning with the  tibia, then the femur and then the patella.  Meticulous removal of all extra  cement was performed.  Once the cement was appropriately hardened, the knee  was again taken through a range of motion.  Again, confirmed the appropriate  soft tissue balance.   We began with a 10 mm tray, as well as a 12 mm tray.  The 12.5 mm tray had  the best soft tissue balance and excellent mobility.  The trial tray was  then removed.  The knee was then flexed up and finally irrigation and  cleaning of the joint was performed.  All residual bony and cement debris was removed.  The 12.5 mm rotating platform tray was then placed.  The knee  was reduced and  taken through range of motion, showing excellent stability  and good patellar tracking.   A Hemovac drain was then brought out laterally.  The tourniquet was let  down.  Final hemostasis was obtained.  The incisions were closed with  interrupted figure-of-eight #1 Vicryl  sutures for the medial parapatellar  arthrotomy, 2-0 Vicryl for the subcutaneous and intracuticular 3-0 Monocryl  for the skin; followed by Steri-Strips.  Marcaine 0.5%  was instilled in the end of the joints during and at end of the case.  Bulky  dry dressing was applied, followed by an Ace bandage for the extremity.  The  patient was then transferred to the recovery room in stable satisfactory  condition.                                               Vania Rea. Supple, M.D.    KMS/MEDQ  D:  01/07/2004  T:  01/07/2004  Job:  [id number]

## 2011-04-28 NOTE — H&P (Signed)
[facility]  Patient:    [name], [name]                      MRN: [id number] Adm. Date:  [id number] Attending:  Elie Confer                         History and Physical  CHIEF COMPLAINT:  Dizzy and weak.  HISTORY OF PRESENT ILLNESS:  This 77 year old white female with history of hypertension, diverticulosis, depression comes in complaining of weakness, intermittent loose, dark stools, and dizziness since being discharged from the hospital on November 17. She states that over the last 3 days she has had more frequent stools, more dizziness, sweats and chills, and has not been able to take p.o. She has not been able to take her medications for the last couple of days, as well.  She was recently admitted to [facility] and diagnosed with pseudomembranous colitis, was given 10 days of Flagyl and sent home. She had been seen for follow-up by Dr. Kevan Ny in the office and had had repeat blood work which had shown a nearly normal hemoglobin. Four days before admission, she was seen complaining of ongoing dizziness and diarrhea. Her bout with pseudomembranous colitis started within a week after discharge from the hospital for orthopedic procedure on her knee for which she was given Levaquin.  PAST MEDICAL HISTORY:  Significant for hypertension, GE reflux disease followed by Barbette Hair. Arlyce Dice, M.D., degenerative joint disease and recent right knee replacement performed by Vania Rea. Supple, M.D., anxiety and depression, left TMJ disease, hypertension, and diverticulosis.  MEDICATIONS ON ADMISSION: 1. Antivert 25 mg every 6 to 8 hours as needed. 2. Prilosec 20 mg a day. 3. Imipramine 50 mg a day. 4. Labetalol 300 mg 1/2 q.a.m. and 1 1/2 q.p.m. 5. Premarin 1.25 q.d. 6. Xanax 0.5 mg b.i.d. p.r.n.  SOCIAL HISTORY:  Retired. Does not smoke or drink alcohol. Lives with her husband.  ALLERGIES:  She is allergic to ACE INHIBITORS.  FAMILY  HISTORY:  Parent with hypertension and CVA. Grandparents with diabetes.  REVIEW OF SYSTEMS:  Denies headaches. Does complain of abdominal pain and soreness over the last 2 days. Also a sore mouth and denies nausea although states that she cannot drink or eat. Denies any chest pain but has had some shortness of breath. Denies any urinary symptoms.  PHYSICAL EXAMINATION:  VITAL SIGNS:  Pulse of 129, blood pressure 112/72, temperature is 98.4, respirations are 18.  GENERAL:  She is lying in bed, very weak, but alert and oriented x 3.  HEENT:  Pupils are equal, round, and reactive to light. Her fundi are benign. Her conjunctivae are pink. Her TMs are normal. Her oropharynx is tacky but there are thick, whitish exudates which scrap away to reveal an erythematous base on her tongue and cheeks.  NECK:  Supple. Nontender. No adenopathy or thyromegaly.  LUNGS:  Clear.  CARDIAC:  Regular rate and rhythm. No murmur heard. Tachycardic.  ABDOMEN:  Distended. Diffusely tender but no guarding or rebound. No masses. Bowel sounds are slightly decreased.  EXTREMITIES:  Without clubbing, cyanosis, or edema.  SKIN:  Without rash. Warm, smooth, and dry.  LABORATORY DATA:  Labs show a white count elevated at 14,600. No differential is available at this time. Hemoglobin is 13.3 and 262,000 platelets. CMET is not available at this time.  ASSESSMENT AND PLAN: 1. Dehydration due to ongoing diarrhea and gastrointestinal  losses and    diminished p.o. intake. IV fluid to correct. Careful Is and Os and    correction of symptoms. 2. Diarrhea with dark stools, possible melena with recent pseudomembranous    colitis. 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 difficile. Will await toxin results.    Start Flagyl. May need to use IV vancomycin if unable to take p.o. GI is    consulted, Judie Petit T. Pleas Koch., M.D. 3. Tachycardia and prostration. Certainly due to dehydration but may also be    due to withdrawal from her beta  blocker. Will try to resume labetalol at a    lower dose, also rule out sepsis and urosepsis with a urine culture, blood    culture. 4. Oral thrush. Most likely secondary to multiple antibiotic use. Nystatin    oral suspension is ordered. 5. Hypertension. At this time, controlled due to dehydration, but we will try    to resume labetalol at a lower dose 150 mg twice a day to avoid rebound    tachycardia. 6. Gastroesophageal reflux disease. Continue on Prilosec 20 mg a day. 7. Depression. Continue on imipramine 50 mg at bedtime.DD:  11/16/00 TD:  11/17/00 Job: [id number]

## 2011-04-28 NOTE — Consult Note (Signed)
[name]. [facility]  Patient:    [name], [name]                      MRN: [id number] Proc. Date: 11/18/00 Adm. Date:  [id number] Attending:  Anastasio Auerbach CC:         Anastasio Auerbach, M.D.  [facility] [address]  Duncan Dull, M.D.  Dr. Arlyce Dice   Consultation Report  HISTORY OF PRESENT ILLNESS:  [name] is a 77 year old, right-handed white female born [birth year], with a history of hypertension, obesity, and recent problems with C. difficile colitis.  This patient was admitted on December 7 with a recurrence of the C. difficile colitis.  The patient has also had evidence of a urinary tract infection as well.  White blood counts have become elevated.  The patient was actually doing fairly well today but had sudden onset of problems with slurred speech and left hemiparesis that began around 12:30 p.m. today.  The physician was contacted to within 10 to 15 minutes following discovery of this problem, and a code stroke was called. The patient was sent for a CT scan of the brain, and this appears to be unremarkable.  The patient does have clear left hemiparesis, face greater than arm greater than leg, consistent with a right frontal stroke.  No sensory deficit noted.  No visual field changes noted.  T-PA has been contemplated and initiated.  PAST MEDICAL HISTORY:  Significant for:  1. New-onset left hemiparesis, right frontal stroke.  2. History of obesity.  3. Recurrence of C. difficile colitis.  4. History of right total knee replacement in the recent past.  5. History of gastroesophageal reflux disease.  6. History of anxiety and depression.  7. History of hysterectomy.  8. History of hypertension.  9. History of glaucoma. 10. History of lens implant on the left. 11. History of anemia. 12. History of TMJ surgery in the past. 13. History of left foot surgery in the past. 14. History of diverticulosis.  MEDICATIONS:  1. Imipramine 50 mg q.h.s.  2. Labetalol 150 mg b.i.d.  3. Flagyl 250 mg q.i.d.  4. Nystatin 2 cc q.i.d.  5. Protonix 40 mg daily.  6. Tylenol 650 mg q.4h. p.r.n.  7. Xanax 0.25 mg if needed.  8. Darvocet if needed.  ALLERGIES:  ALTACE.  SOCIAL HISTORY:  The patient does not smoke or drink.  This patient lives in the [place] area, is married, not employed, and has two children who are alive and well.  FAMILY MEDICAL HISTORY:  Notable that both parents have passed away; the patient does not know the cause of death of her mother; father died with MI. The patient has two sisters, both with hypertension.  One sister has had coronary artery disease and a CABG procedure.  REVIEW OF SYSTEMS:  Notable for some headache that has developed since the onset of stroke symptoms, bifrontal in nature.  The patient denies any visual field changes, denies any numbness on the arms or legs, denies any chest pain but does note some shortness of breath.  Feels slightly nauseated and diaphoretic.  Does have some abdominal tenderness.  The patient claims that she has had some problems controlling the bladder.  She does note some slight double vision that is horizontal in nature and has had some slurred speech. The patient denied any blacking out episodes.  PHYSICAL EXAMINATION:  VITAL SIGNS:  Blood pressure is 137/91, heart rate 129,  respiratory rate 18, and temperature afebrile.  CBG is 151.  GENERAL:  This patient is a moderately obese white female who is alert and cooperative at the time of examination.  HEENT:  Head is atraumatic.  Eyes:  Pupils are equal, round, and reactive to light.  Disks appear to be flat bilaterally.  NECK:  Supple.  No carotid bruits noted.  RESPIRATORY:  Appears to be clear to auscultation.  CARDIOVASCULAR:  Distant heart sounds.  No obvious murmurs or rubs noted.  EXTREMITIES:  Without significant edema, possibly trace edema at the  ankles bilaterally.  NEUROLOGIC:  Cranial nerves as above.  Facial asymmetry is not present.  Very definite flattening of the left nasolabial fold noted.  The patient has good symmetry to pinprick sensation on the face.  The patient has full visual field _______.  Extraocular movements are full.  The patient has evidence of a left hemiparesis on motor testing with 4-/5 strength of the left arm, strictly distally, and 4+/5 strength of the left leg.  The patient has good symmetry to deep tendon reflexes but are slightly depressed throughout.  Upgoing toes noted on the left, neutral on the right.  The patient notes good symmetry to pinprick, soft touch, and vibratory sensation on the arms and legs.  The patient has difficulty performing finger-to-nose _______ with left arm; is able to perform it with the right arm and can perform toe-to-finger with the lower extremities bilaterally.  Very definite pronator drift noted in the left arm.  The patient was not ambulated.  LABORATORY DATA:  Laboratory values are notable for a white blood count of 21.6, hemoglobin of 14.1, hematocrit of 44.7, MCV of 87.0, and platelets of 332.  Sodium 137, potassium 3.9, chloride 108, CO2 of 22, glucose 179, BUN 23, and creatinine 1.2.  Calcium 7.8.  CPK level of 18.  CT scan of the brain shows no acute changes, possibly some small vessel ischemic changes in the deep posterior parietal area, right greater than the left.  IMPRESSION: 1. New onset of right brain stroke, likely right frontal infarct with left    hemiparesis in the middle cerebral distribution. 2. Clostridium difficile colitis. 3. Hypertension.  This patient has clearly had an acute onset of left hemiparesis.  I have had discussion with the family, and they have agreed to initiate t-PA.  T-PA is  being administered approximately 1 hour 45 minutes to 2 hours following the onset of the deficit.  Need to watch out for bleeding from the colitis  or intracranial source of hemorrhage.  Will initiate stroke workup.  PLAN: 1. Standard stroke treatment protocol with normal saline, IV fluids, oxygen,    and keep the patient flat in bed. 2. Carotid Doppler study. 3. A 2-D echocardiogram. 4. Consider MRI of the brain and MR angiogram at some point in the future. 5. Speech therapy evaluation.  The patient will be n.p.o. for now. 6. Physical and occupational therapy. 7. Will consider repeat CT scan of the brain at 24 hours out after    administration of t-PA.  The patient may go on heparin after that point. 8. Will follow the patients clinical course while in-house. DD:  11/18/00 TD:  11/18/00 Job: [id number]

## 2011-04-28 NOTE — Discharge Summary (Signed)
NAME:  [name], [name]                       ACCOUNT NO.:  [id number]   MEDICAL RECORD NO.:  [id number]                   PATIENT TYPE:  INP   LOCATION:  0452                                 FACILITY:  [facility]   PHYSICIAN:  Vania Rea. Supple, M.D.               DATE OF BIRTH:  March 22, 1934   DATE OF ADMISSION:  01/07/2004  DATE OF DISCHARGE:  01/12/2004                                 DISCHARGE SUMMARY   ADMISSION DIAGNOSES:  1. End-stage osteoarthritis, left knee.  2. History of cerebrovascular accident and Pseudomembranous colitis     postoperatively.  3. Previous total knee arthroplasty.  4. Osteoarthritis.  5. Type 2 diabetes mellitus.  6. Hypertension.  7. Gastroesophageal reflux disease.  8. Urinary incontinence.   DISCHARGE DIAGNOSES:  1. End-stage osteoarthritis, left knee.  2. History of cerebrovascular accident and Pseudomembranous colitis     postoperatively.  3. Previous total knee arthroplasty.  4. Osteoarthritis.  5. Type 2 diabetes mellitus.  6. Hypertension.  7. Gastroesophageal reflux disease.  8. Urinary incontinence.  9. Postoperative hemorrhagic anemia requiring transfusion without sequela.   OPERATIONS:  Left total knee arthroplasty; surgeon was Dr. Francena Hanly;  assistant was Ralene Bathe; anesthesia was spinal anesthetic.   CONSULTATIONS:  Dr. Valetta Mole. Swords saw the patient and followed medically.   BRIEF HISTORY:  [name] is a 77 year old female well known [place] to has had  bilateral end-stage osteoarthritis of the knees and has undergone previous  right total knee arthroplasty. She unfortunately had postoperative  complications with pseudomembranous colitis and cerebrovascular accident.  She has medically stabilized, but has however put off his left total knee  arthroplasty. She has known end-stage osteoarthritis and conservative  measures are no longer helping her.  At this time she has been seen and  evaluated by Dr. Birdie Sons and received  medical clearance, but this time  she wished to proceed with total knee arthroplasty if indicated. The risks,  benefits, and options were discussed at length and she wished to proceed.   HOSPITAL COURSE:  The patient was admitted and underwent the above mentioned  procedure and tolerated this well. Appropriate IV antibiotics and analgesics  were utilized. Postoperatively, the patient was placed on Coumadin for DVT  and PE prophylaxis. She was placed on CPM and placed on 48 hours of  intravenous antibiotics.  She was allowed weightbearing as tolerated with  total knee precautions and physical therapy protocol. The patient did  extremely well, however had a postoperative hemorrhage anemia and did  undergo transfusion with good result. Overall, she did extremely well. By  date January 12, 2004, the patient was afebrile, she had met all therapy and  goals. Incision was clean and dry, and at that time she was stable for  discharge to home. Laboratory data shows admission hemoglobin of 12.7, down  to 8.9, and after transfusion 10.2.  Chemistries on admission were within  normal limits.  Urinalysis was negative. Blood type O positive. Protimes,  INR followed by pharmacy on Coumadin. Two units of transfused in the chart.  X-rays preoperatively shows extensive tricompartmental osteoarthritis.  I do  not see an EKG or chest x-ray on this hospital stay. Dr. Birdie Sons did  see and follow [name] along medically postoperatively and she was noted  to have no complications.   CONDITION ON DISCHARGE:  Stable and improved.   DISCHARGE MEDICATIONS AND PLANS:  The patient is being discharged to home.  She is to continue home medications except for Plavix while she is on  Coumadin. She is to be on Coumadin for DVT and PE prophylaxis per pharmacy.  Prescription provided for Vicodin ES, Restoril, Robaxin, and Coumadin today.  Followup in two weeks in the office and call for a time. She is   weightbearing as tolerated. Total knee arthroplasty protocol with home  health PT and RN. Resume home diet.     Tracy A. Shuford, P.A.-C.                 Vania Rea. Supple, M.D.    TAS/MEDQ  D:  02/29/2004  T:  03/01/2004  Job:  [id number]   cc:   Valetta Mole. Swords, M.D. [facility]

## 2011-04-28 NOTE — Op Note (Signed)
[facility]  Patient:    [name], [name]                      MRN: [id number] Proc. Date: 10/01/00 Adm. Date:  [id number] Attending:  Cain Sieve                           Operative Report  PREOPERATIVE DIAGNOSIS:  End-stage right knee osteoarthrosis.  POSTOPERATIVE DIAGNOSIS:  End-stage right knee osteoarthrosis.  PROCEDURE;  Cemented right [employer] [place] total knee arthroplasty utilizing a size 5 posterior stabilized femur, a size 5 tibial tray, a 15 mm thick polyethylene insert, and a 26 mm recessed patella.  SURGEON:  Vania Rea. Supple, M.D.  ASSISTANTFrench Ana Shuford, P.A.-C.  ANESTHESIA:  General endotracheal.  TOURNIQUET TIME:  Ninety-eight minutes.  ESTIMATED BLOOD LOSS:  Minimal.  DRAINS:  None.  INDICATIONS:  The patient is a 77 year old female who has had a long history of bilateral knee pain and radiographs _______ evidence of end-stage osteoarthrosis.  Due to ongoing increasing pain and functional limitations, he is brought to the operating room at this time for the above outlined procedure.  Preoperatively, she had been counseled on treatment options as well as risks versus benefits thereof, and possible complications with bleeding, infection, neurovascular injury, DVT, PE, persistence of pain, and possible need of revision surgery were all reviewed.  She understands, accepts, and agrees to the planned procedure.  DESCRIPTION OF PROCEDURE:  After undergoing routine preoperative evaluation, the patient was placed supine on the operating table and underwent smooth induction of general endotracheal anesthesia.  A Foley catheter was then placed.  It appears that she had a cystic mass in the region of the labia, suspicious for a possible Bartholins gland cyst.  The Foley catheter was placed without difficulty and it was elected to obtain a Gyn consult.  There was no obvious frank purulence emanating from this region.  At  this point, a tourniquet was then applied to the right thigh, and the right leg was then sterilely prepped and draped in the standard fashion.  She received 1 g of IV Cephalosporin prophylactically.  The leg was exsanguinated with the tourniquet inflated to 350 mmHg.  An anterior midline incision was then made from four fingerbreadths above the patella to just medial to the tibial tubercle for a length of approximately 18 cm.  Skin flaps were elevated medially and laterally with electrocautery used for hemostasis.  A medial parapatellar arthrotomy was then made utilizing electrocautery.  The patella was then everted and approximately 50-60% of the infrapatellar fat pad was then removed.  The ACL was divided and resected as was the PCL.  Medial and lateral menisci were also removed.  The knee was then flexed up with the intermedullary guide directed down the femoral canal after a guide hole had been drilled.  The distal femoral cutting guide was then placed and a 10 mm cut was then made across the distal femur in a 5 degree valgus alignment.  The distal femoral sizing guide was then placed and the size 5 femur had the best fit.  The size 5 femoral chamfer cutting guide was then placed and the anterior and posterior chamfer cuts were then made on the distal femur.  Attention was then directed to the proximal tibia where the tibial spines were removed with the saw.  Intermedullary guide was directed down the tibial shaft after an appropriate starting hole  was established in the tibial metaphysis. A 5 degree posterior slope cut was then made, removing 4 mm of the defect on the medial tibial plateau.  At this point, our attention was then redirected to the distal femur where the trochlear groove wass then cut using the appropriate jig, and then a box cut wa made into the distal femoral intercondylar notch utilizing appropriate osteotomes and punch.  Care was taken to make sure to protect the  surrounding bone and soft tissue during this part of the procedure.  Next, the trial implants were then placed.  Appropriate rotation of the tibial tray was then determined.  The knee was taken through a range of motion showing good stability of the soft tissues.  Based on rotation of the tibial tray, the keel cutting guide was then placed into position, and the appropriate keel cuts were then made utilizing the punch.  At this point, our attention was directed to the patella where the cutting guide was applied and the 26 mm recessed patella was then cut.  The stabilizing screw holes were then drilled.  The trial implant was then placed and the surrounding osteophytes were then trimmed.  At this point, pulsatile lavage was then utilized to meticulously debride the joint.  The cement was then mixed and at the appropriate consistency, the implants were then cemented into position beginning with the tibia, then the femur, and then the patella.  After the cement had completely hardened, meticulous debridement and removal of all extra cement was performed.  She also mentioned that osteophytes were removed from the posterior aspect of the femoral condyles prior to placement of the implants.  At this point, final trials for the tibial tray were then placed and the 15 mm implant had the best soft tissue balance.  A final 15 mm insert was then opened, and after the tibial tray was meticulously cleaned, it was impacted into position.  The knee was then taken through a full range of motion showing good stability.  The patella tracked in a good fashion and was showing no lateral tethering, and so a lateral release was not performed.  At this point, the wound was then closed with a running #1 Vicryl for the synovium, interrupted figure-of-eight #1 Vicryl for the extensor mechanism, 2-0 Vicryl for the subcutaneous, and staples applied to the skin.  Twenty cubic centimeters of 0.5% Marcaine with  epinephrine was instilled into the joint at the end of the case.  Adaptic and a bulky dry dressing was then wrapped about the knee.  The leg was wrapped with an Ace bandage from foot to thigh.   The patient was then extubated and taken to the recovery room in stable condition. DD:  10/01/00 TD:  10/02/00 Job: [id number]

## 2011-04-28 NOTE — Op Note (Signed)
NAME:  [name], [name]             ACCOUNT NO.:  [id number]   MEDICAL RECORD NO.:  [id number]          PATIENT TYPE:  AMB   LOCATION:  SDS                          FACILITY:  [facility]   PHYSICIAN:  Earvin Hansen L. [place]es[place], M.D.DATE OF BIRTH:  [birth year]   DATE OF PROCEDURE:  DATE OF DISCHARGE:  04/29/2007                               OPERATIVE REPORT   HISTORY OF PRESENT ILLNESS:  This patient is status severe injury to her  upper lip area.  Had repair done by the emergency room physician.  When  the repair was done, however, the vermilion borders and a cupid's bow  was high in a disproportionate area, way high, almost to the nose, and  she also has a severe scar cicatrix in that area with a trapdoor  deformity.   PROCEDURES PLANNED:  Release of all scar tissue, Y-plasty, scar  revision, and reconstructions.   ANESTHESIA:  General.   PROCEDURE IN DETAIL:  Patient underwent general anesthesia, intubated  orally.  Prep was done to the face using Betadine soapy solution and  walled off with sterile towels and drapes in order to make a sterile  field.  1% Xylocaine with epinephrine was injected locally of 1:100,000  concentration, a total of 7 mL.  The scars were excised using a #15  blade, trapdoor deformity was elevated, excess scar tissue was removed  using a Beaver blade, #10, as well as towards the cupid's bow to allow  it to sit back in its place.  A Y-plasty design was fashioned, and after  this, the flaps were freed up and then reapproximated into the midline  with 5-0 Monocryl and then the skin edges reapproximated with multiple  sutures of 6-0 Prolene.  Steri-Strips and soft dressings applied to all  the areas.  Estimated blood loss less than 50 mL.   COMPLICATIONS:  None.      Yaakov Guthrie. Shon Hough, M.D.  Electronically Signed     GLT/MEDQ  D:  05/03/2007  T:  05/03/2007  Job:  [id number]

## 2011-05-10 ENCOUNTER — Ambulatory Visit (INDEPENDENT_AMBULATORY_CARE_PROVIDER_SITE_OTHER): Payer: Medicare Other | Admitting: Internal Medicine

## 2011-05-10 ENCOUNTER — Encounter: Payer: Self-pay | Admitting: Internal Medicine

## 2011-05-10 VITALS — BP 124/82 | HR 76 | Temp 98.5°F | Wt 210.0 lb

## 2011-05-10 DIAGNOSIS — E119 Type 2 diabetes mellitus without complications: Secondary | ICD-10-CM

## 2011-05-10 DIAGNOSIS — I10 Essential (primary) hypertension: Secondary | ICD-10-CM

## 2011-05-10 DIAGNOSIS — F329 Major depressive disorder, single episode, unspecified: Secondary | ICD-10-CM

## 2011-05-10 DIAGNOSIS — F3289 Other specified depressive episodes: Secondary | ICD-10-CM

## 2011-05-10 LAB — LIPID PANEL
HDL: 59.6 mg/dL (ref >39.00)
Triglycerides: 122 mg/dL (ref 0.0–149.0)
VLDL: 24.4 mg/dL (ref 0.0–40.0)

## 2011-05-10 LAB — BASIC METABOLIC PANEL
Calcium: 9.7 mg/dL (ref 8.4–10.5)
Creatinine, Ser: 1.1 mg/dL (ref 0.4–1.2)
GFR: 49.86 mL/min — ABNORMAL LOW (ref >60.00)
Sodium: 139 mEq/L (ref 135–145)

## 2011-05-10 LAB — HEPATIC FUNCTION PANEL
ALT: 15 U/L (ref 0–35)
AST: 21 U/L (ref 0–37)
Albumin: 3.8 g/dL (ref 3.5–5.2)
Total Bilirubin: 0.4 mg/dL (ref 0.3–1.2)

## 2011-05-10 LAB — HEMOGLOBIN A1C: Hgb A1c MFr Bld: 6.8 % — ABNORMAL HIGH (ref 4.6–6.5)

## 2011-05-10 MED ORDER — POTASSIUM CHLORIDE CRYS ER 20 MEQ PO TBCR: 20.0000 meq | EXTENDED_RELEASE_TABLET | Freq: Every day | ORAL | Status: DC

## 2011-05-10 MED ORDER — LABETALOL HCL 300 MG PO TABS: 300.0000 mg | ORAL_TABLET | Freq: Two times a day (BID) | ORAL | Status: DC

## 2011-05-10 MED ORDER — DESVENLAFAXINE SUCCINATE ER 50 MG PO TB24: 50.0000 mg | ORAL_TABLET | Freq: Every day | ORAL | Status: DC

## 2011-05-10 NOTE — Assessment & Plan Note (Signed)
Controlled Continue meds 

## 2011-05-10 NOTE — Progress Notes (Signed)
  Subjective:    Patient ID: [name], female    DOB: 09-03-1934, 77 y.o.   MRN: [id number]  HPI  patient comes in for followup of multiple medical problems including type 2 diabetes, hyperlipidemia, hypertension. The patient does not check blood sugar or blood pressure at home. The patetient does not follow an exercise or diet program. The patient denies any polyuria, polydipsia.  In the past the patient has gone to diabetic treatment center. The patient is tolerating medications  Without difficulty. The patient does admit to medication compliance.   Depression-main issue. She has thought of suicide---no plans. No current ideations/plans  Past Medical History  Diagnosis Date  . Depression   . Diabetes mellitus   . GERD (gastroesophageal reflux disease)   . Hypertension   . Hyperlipidemia   . CVA (cerebral infarction)   . Aortic stenosis   . Osteoarthritis    Past Surgical History  Procedure Date  . Abdominal hysterectomy     fibroids, no cancer  . Joint replacement     left and right knee  . Rotator cuff repair   . Temporomandibular joint surgery   . Fracture surgery     left leg    reports that she quit smoking about 30 years ago. She does not have any smokeless tobacco history on file. She reports that she does not drink alcohol or use illicit drugs. family history includes Dementia in her mother; Heart attack in her father; Heart failure in her sister; and Hypertension in her mother. Allergies  Allergen Reactions  . Cymbalta (Duloxetine Hcl) Anaphylaxis  . Hydrochlorothiazide     REACTION: rash  . Ramipril     REACTION: difficulty breathing  . Telmisartan-Hctz     REACTION: rash     Review of Systems  patient denies chest pain, shortness of breath, orthopnea. Denies lower extremity edema, abdominal pain, change in appetite, change in bowel movements. Patient denies rashes, musculoskeletal complaints. No other specific complaints in a complete review of systems.  Admits depression     Objective:   Physical Exam  Well-developed well-nourished female in no acute distress. HEENT exam atraumatic, normocephalic, extraocular muscles are intact. Neck is supple. No jugular venous distention no thyromegaly. Chest clear to auscultation without increased work of breathing. Cardiac exam S1 and S2 are regular. Abdominal exam active bowel sounds, soft, nontender. Extremities no edema. Neurologic exam she is alert without any motor sensory deficits. Gait is normal.      Assessment & Plan:

## 2011-05-10 NOTE — Assessment & Plan Note (Signed)
Needs f/u labs Check today 

## 2011-05-10 NOTE — Progress Notes (Signed)
Quick Note:  Pt given normal results. ______

## 2011-05-10 NOTE — Assessment & Plan Note (Addendum)
This is her main problem Needs to try another medication Trail pristiq Refer counseling

## 2011-05-11 ENCOUNTER — Ambulatory Visit: Payer: Medicare Other | Admitting: Gastroenterology

## 2011-05-13 ENCOUNTER — Emergency Department (HOSPITAL_COMMUNITY)
Admission: EM | Admit: 2011-05-13 | Discharge: 2011-05-13 | Disposition: A | Discharge status: Home / Self Care | Payer: Medicare Other | Attending: Internal Medicine | Admitting: Internal Medicine

## 2011-05-13 DIAGNOSIS — F411 Generalized anxiety disorder: Secondary | ICD-10-CM | POA: Insufficient documentation

## 2011-05-13 DIAGNOSIS — E119 Type 2 diabetes mellitus without complications: Secondary | ICD-10-CM | POA: Insufficient documentation

## 2011-05-13 DIAGNOSIS — Z79899 Other long term (current) drug therapy: Secondary | ICD-10-CM | POA: Insufficient documentation

## 2011-05-13 DIAGNOSIS — I10 Essential (primary) hypertension: Secondary | ICD-10-CM | POA: Insufficient documentation

## 2011-05-31 ENCOUNTER — Ambulatory Visit (INDEPENDENT_AMBULATORY_CARE_PROVIDER_SITE_OTHER): Payer: Medicare Other | Admitting: Licensed Clinical Social Worker

## 2011-05-31 DIAGNOSIS — F411 Generalized anxiety disorder: Secondary | ICD-10-CM

## 2011-05-31 DIAGNOSIS — F331 Major depressive disorder, recurrent, moderate: Secondary | ICD-10-CM

## 2011-06-07 ENCOUNTER — Ambulatory Visit: Payer: Medicare Other | Admitting: Internal Medicine

## 2011-06-20 ENCOUNTER — Ambulatory Visit: Payer: Medicare Other | Admitting: Licensed Clinical Social Worker

## 2011-07-12 ENCOUNTER — Ambulatory Visit (INDEPENDENT_AMBULATORY_CARE_PROVIDER_SITE_OTHER): Payer: Medicare Other | Admitting: Licensed Clinical Social Worker

## 2011-07-12 DIAGNOSIS — F331 Major depressive disorder, recurrent, moderate: Secondary | ICD-10-CM

## 2011-07-12 DIAGNOSIS — F411 Generalized anxiety disorder: Secondary | ICD-10-CM

## 2011-07-26 ENCOUNTER — Ambulatory Visit: Payer: Medicare Other | Admitting: Internal Medicine

## 2011-08-02 ENCOUNTER — Ambulatory Visit: Payer: Medicare Other | Admitting: Internal Medicine

## 2011-08-31 ENCOUNTER — Other Ambulatory Visit: Payer: Self-pay | Admitting: Internal Medicine

## 2011-09-04 ENCOUNTER — Ambulatory Visit (INDEPENDENT_AMBULATORY_CARE_PROVIDER_SITE_OTHER): Payer: Medicare Other | Admitting: Internal Medicine

## 2011-09-04 ENCOUNTER — Encounter: Payer: Self-pay | Admitting: Internal Medicine

## 2011-09-04 DIAGNOSIS — E785 Hyperlipidemia, unspecified: Secondary | ICD-10-CM

## 2011-09-04 DIAGNOSIS — E119 Type 2 diabetes mellitus without complications: Secondary | ICD-10-CM

## 2011-09-04 DIAGNOSIS — I10 Essential (primary) hypertension: Secondary | ICD-10-CM

## 2011-09-04 DIAGNOSIS — Z23 Encounter for immunization: Secondary | ICD-10-CM

## 2011-09-04 DIAGNOSIS — F329 Major depressive disorder, single episode, unspecified: Secondary | ICD-10-CM

## 2011-09-04 DIAGNOSIS — Z0000 Encounter for general adult medical examination without abnormal findings: Secondary | ICD-10-CM

## 2011-09-04 LAB — GLUCOSE, POCT (MANUAL RESULT ENTRY): POC Glucose: 266

## 2011-09-04 MED ORDER — BUPROPION HCL ER (XL) 150 MG PO TB24: 150.0000 mg | ORAL_TABLET | Freq: Every day | ORAL | Status: DC

## 2011-09-04 NOTE — Progress Notes (Signed)
  Subjective:    Patient ID: [name], female    DOB: [birth year], 77 y.o.   MRN: [id number]  HPI  77 year old patient who is seen today for followup. She has been on Pristiq for approximately 6 weeks. She has noted increase appetite with weight gain and a general sense of apathy. She has type 2 diabetes and has seen her blood sugars trending up. Hemoglobin A1c's have been under excellent control for years. The trending up. Her last hemoglobin A1c was 6.83 months ago. She has treated hypertension which has been controlled on present therapy. She otherwise feels well. She denies any cardiopulmonary complaints he remains on simvastatin for dyslipidemia.    Review of Systems  Constitutional: Negative.   HENT: Negative for hearing loss, congestion, sore throat, rhinorrhea, dental problem, sinus pressure and tinnitus.   Eyes: Negative for pain, discharge and visual disturbance.  Respiratory: Negative for cough and shortness of breath.   Cardiovascular: Negative for chest pain, palpitations and leg swelling.  Gastrointestinal: Negative for nausea, vomiting, abdominal pain, diarrhea, constipation, blood in stool and abdominal distention.  Genitourinary: Negative for dysuria, urgency, frequency, hematuria, flank pain, vaginal bleeding, vaginal discharge, difficulty urinating, vaginal pain and pelvic pain.  Musculoskeletal: Negative for joint swelling, arthralgias and gait problem.  Skin: Negative for rash.  Neurological: Negative for dizziness, syncope, speech difficulty, weakness, numbness and headaches.  Hematological: Negative for adenopathy.  Psychiatric/Behavioral: Positive for dysphoric mood. Negative for behavioral problems and agitation. The patient is nervous/anxious.        Objective:   Physical Exam  Constitutional: She appears well-developed and well-nourished. No distress.       Blood pressure 110/70 Weight 213 Random blood sugar 266          Assessment & Plan:    Depression. We'll discontinue Pristiq and give a trial of Wellbutrin. Will ask to return in 6 weeks for followup Diabetes mellitus. Exercise weight loss encouraged. Will check a hemoglobin A1c Hypertension stable Dyslipidemia we'll continue simvastatin.

## 2011-09-04 NOTE — Patient Instructions (Signed)
Please check your hemoglobin A1c every 3 months  Limit your sodium (Salt) intake    It is important that you exercise regularly, at least 20 minutes 3 to 4 times per week.  If you develop chest pain or shortness of breath seek  medical attention.   

## 2011-09-05 LAB — POCT CARDIAC MARKERS
CKMB, poc: 6.9
Myoglobin, poc: >500
Operator id: 294341
Troponin i, poc: <0.05

## 2011-09-05 LAB — COMPREHENSIVE METABOLIC PANEL
ALT: 27
ALT: 32
AST: 35
AST: 52 — ABNORMAL HIGH
Albumin: 2.7 — ABNORMAL LOW
Albumin: 4.2
Alkaline Phosphatase: 83
Alkaline Phosphatase: 84
BUN: 11
CO2: 30
Chloride: 98
Chloride: 99
Creatinine, Ser: 0.8
GFR calc Af Amer: 60 — ABNORMAL LOW
GFR calc Af Amer: >60
GFR calc non Af Amer: >60
Potassium: 3.6
Potassium: 3.8
Sodium: 138
Total Bilirubin: 0.6
Total Bilirubin: 0.9
Total Protein: 7.3

## 2011-09-05 LAB — URINALYSIS, ROUTINE W REFLEX MICROSCOPIC
Glucose, UA: NEGATIVE
Hgb urine dipstick: NEGATIVE
Nitrite: NEGATIVE
Protein, ur: NEGATIVE
Specific Gravity, Urine: 1.008
Specific Gravity, Urine: 1.023
Urobilinogen, UA: 0.2
Urobilinogen, UA: 0.2
pH: 6

## 2011-09-05 LAB — CBC
HCT: 28.7 — ABNORMAL LOW
Hemoglobin: 9.7 — ABNORMAL LOW
MCHC: 33.8
Platelets: 218
Platelets: 255
RDW: 12.8
RDW: 13.2
WBC: 8.1

## 2011-09-05 LAB — HEMOGLOBIN A1C: Hgb A1c MFr Bld: 6.7 % — ABNORMAL HIGH (ref 4.6–6.5)

## 2011-09-05 LAB — NA AND K (SODIUM & POTASSIUM), RAND UR
Potassium Urine: 41
Sodium, Ur: <10

## 2011-09-05 LAB — CK TOTAL AND CKMB (NOT AT ARMC): Relative Index: 0.9

## 2011-09-05 LAB — PROTIME-INR: INR: 0.9

## 2011-09-05 LAB — POCT I-STAT, CHEM 8
Chloride: 100
HCT: 32 — ABNORMAL LOW
Potassium: 4.6

## 2011-09-05 LAB — URINE MICROSCOPIC-ADD ON

## 2011-09-05 LAB — TROPONIN I: Troponin I: 0.01

## 2011-09-05 LAB — CARDIAC PANEL(CRET KIN+CKTOT+MB+TROPI)
CK, MB: 4.3 — ABNORMAL HIGH
Troponin I: 0.01

## 2011-09-05 LAB — LIPID PANEL
LDL Cholesterol: 65
VLDL: 19

## 2011-09-06 ENCOUNTER — Ambulatory Visit: Payer: Medicare Other | Admitting: Internal Medicine

## 2011-09-19 ENCOUNTER — Other Ambulatory Visit: Payer: Self-pay | Admitting: Internal Medicine

## 2011-09-25 ENCOUNTER — Other Ambulatory Visit: Payer: Self-pay | Admitting: Internal Medicine

## 2011-09-25 DIAGNOSIS — Z1231 Encounter for screening mammogram for malignant neoplasm of breast: Secondary | ICD-10-CM

## 2011-10-02 ENCOUNTER — Other Ambulatory Visit: Payer: Self-pay | Admitting: Internal Medicine

## 2011-10-07 ENCOUNTER — Other Ambulatory Visit: Payer: Self-pay | Admitting: Internal Medicine

## 2011-10-09 ENCOUNTER — Other Ambulatory Visit: Payer: Self-pay | Admitting: *Deleted

## 2011-10-11 ENCOUNTER — Ambulatory Visit (HOSPITAL_COMMUNITY): Payer: Medicare Other

## 2011-10-19 ENCOUNTER — Ambulatory Visit: Payer: Medicare Other | Admitting: Internal Medicine

## 2011-10-24 ENCOUNTER — Encounter: Payer: Self-pay | Admitting: Internal Medicine

## 2011-10-24 ENCOUNTER — Ambulatory Visit (INDEPENDENT_AMBULATORY_CARE_PROVIDER_SITE_OTHER): Payer: Medicare Other | Admitting: Internal Medicine

## 2011-10-24 DIAGNOSIS — E119 Type 2 diabetes mellitus without complications: Secondary | ICD-10-CM

## 2011-10-24 DIAGNOSIS — F329 Major depressive disorder, single episode, unspecified: Secondary | ICD-10-CM

## 2011-10-24 DIAGNOSIS — I10 Essential (primary) hypertension: Secondary | ICD-10-CM

## 2011-10-24 MED ORDER — BUPROPION HCL ER (XL) 150 MG PO TB24: ORAL_TABLET | ORAL | Status: DC

## 2011-10-24 MED ORDER — SERTRALINE HCL 50 MG PO TABS: 50.0000 mg | ORAL_TABLET | Freq: Every day | ORAL | Status: DC

## 2011-10-24 NOTE — Progress Notes (Signed)
  Subjective:    Patient ID: [name], female    DOB: 01-Feb-1934, 77 y.o.   MRN: [id number]  HPI  77 year old patient who is seen today in followup. She was seen 6 weeks ago for followup of depression. At that time Pristiq was discontinued and she was placed on Wellbutrin 150 mg daily. She seemed to have an initial response but now is again depressed tearful and feels unwell. She does have a therapist that she sees periodically. She has an allergic reaction to Cymbalta in the past but has done well on Pristiq as well as tolerability. She has a long history of chronic depression    Review of Systems  Psychiatric/Behavioral: Positive for behavioral problems and dysphoric mood.       Objective:   Physical Exam  Constitutional: She appears well-developed and well-nourished. No distress.  Psychiatric: She has a normal mood and affect. Her behavior is normal. Judgment and thought content normal.          Assessment & Plan:    Depression. We'll increase Wellbutrin to 300 mg daily. We'll add sertraline 50 mg daily. Recheck 6 weeks. She'll also followup with her therapist

## 2011-10-24 NOTE — Patient Instructions (Signed)
Limit your sodium (Salt) intake    It is important that you exercise regularly, at least 20 minutes 3 to 4 times per week.  If you develop chest pain or shortness of breath seek  medical attention.  Please followup with your therapist  Return here in 6 weeks for followup or as needed

## 2011-11-08 ENCOUNTER — Ambulatory Visit (HOSPITAL_COMMUNITY)
Admission: RE | Admit: 2011-11-08 | Discharge: 2011-11-08 | Disposition: A | Discharge status: Home / Self Care | Payer: Medicare Other | Source: Ambulatory Visit | Attending: Internal Medicine | Admitting: Internal Medicine

## 2011-11-08 DIAGNOSIS — Z1231 Encounter for screening mammogram for malignant neoplasm of breast: Secondary | ICD-10-CM | POA: Insufficient documentation

## 2011-11-15 ENCOUNTER — Telehealth: Payer: Self-pay | Admitting: Internal Medicine

## 2011-11-15 MED ORDER — BUPROPION HCL ER (XL) 150 MG PO TB24: ORAL_TABLET | ORAL | Status: DC

## 2011-11-15 NOTE — Telephone Encounter (Signed)
rx sent in electronically 

## 2011-11-15 NOTE — Telephone Encounter (Signed)
Was on Bupropion 1qd. Dr Kirtland Bouchard increased to bid. Please call in a new rx to [facility] [address]. Thanks.

## 2011-11-17 ENCOUNTER — Other Ambulatory Visit: Payer: Self-pay | Admitting: Internal Medicine

## 2011-11-17 NOTE — Telephone Encounter (Signed)
Pt need new rx bupropion 150mg  twice a day instead of once a day. [facility] [address] [phone]. Pt is out due to doubling med

## 2011-11-17 NOTE — Telephone Encounter (Signed)
rx sent in on 12/5, pt aware

## 2011-12-09 ENCOUNTER — Other Ambulatory Visit: Payer: Self-pay | Admitting: Internal Medicine

## 2011-12-11 ENCOUNTER — Ambulatory Visit: Payer: Medicare Other | Admitting: Internal Medicine

## 2012-01-09 ENCOUNTER — Other Ambulatory Visit: Payer: Self-pay | Admitting: Internal Medicine

## 2012-02-04 ENCOUNTER — Other Ambulatory Visit: Payer: Self-pay

## 2012-02-04 ENCOUNTER — Emergency Department (HOSPITAL_COMMUNITY)
Admission: EM | Admit: 2012-02-04 | Discharge: 2012-02-04 | Disposition: A | Discharge status: Home Health | Payer: Medicare Other | Attending: Emergency Medicine | Admitting: Emergency Medicine

## 2012-02-04 ENCOUNTER — Encounter (HOSPITAL_COMMUNITY): Payer: Self-pay | Admitting: *Deleted

## 2012-02-04 DIAGNOSIS — E785 Hyperlipidemia, unspecified: Secondary | ICD-10-CM | POA: Diagnosis not present

## 2012-02-04 DIAGNOSIS — S01501A Unspecified open wound of lip, initial encounter: Secondary | ICD-10-CM | POA: Insufficient documentation

## 2012-02-04 DIAGNOSIS — W19XXXA Unspecified fall, initial encounter: Secondary | ICD-10-CM

## 2012-02-04 DIAGNOSIS — I951 Orthostatic hypotension: Secondary | ICD-10-CM | POA: Insufficient documentation

## 2012-02-04 DIAGNOSIS — Y92009 Unspecified place in unspecified non-institutional (private) residence as the place of occurrence of the external cause: Secondary | ICD-10-CM | POA: Insufficient documentation

## 2012-02-04 DIAGNOSIS — E119 Type 2 diabetes mellitus without complications: Secondary | ICD-10-CM | POA: Diagnosis not present

## 2012-02-04 DIAGNOSIS — W1809XA Striking against other object with subsequent fall, initial encounter: Secondary | ICD-10-CM | POA: Insufficient documentation

## 2012-02-04 DIAGNOSIS — Z8673 Personal history of transient ischemic attack (TIA), and cerebral infarction without residual deficits: Secondary | ICD-10-CM | POA: Diagnosis not present

## 2012-02-04 DIAGNOSIS — S01511A Laceration without foreign body of lip, initial encounter: Secondary | ICD-10-CM

## 2012-02-04 DIAGNOSIS — I10 Essential (primary) hypertension: Secondary | ICD-10-CM | POA: Diagnosis not present

## 2012-02-04 DIAGNOSIS — Z79899 Other long term (current) drug therapy: Secondary | ICD-10-CM | POA: Diagnosis not present

## 2012-02-04 LAB — POCT I-STAT, CHEM 8
Chloride: 102 mEq/L (ref 96–112)
Creatinine, Ser: 0.9 mg/dL (ref 0.50–1.10)
Glucose, Bld: 156 mg/dL — ABNORMAL HIGH (ref 70–99)
Potassium: 4.2 mEq/L (ref 3.5–5.1)
Sodium: 138 mEq/L (ref 135–145)

## 2012-02-04 LAB — URINALYSIS, ROUTINE W REFLEX MICROSCOPIC
Bilirubin Urine: NEGATIVE
Glucose, UA: NEGATIVE mg/dL
Hgb urine dipstick: NEGATIVE
Specific Gravity, Urine: 1.011 (ref 1.005–1.030)
pH: 7 (ref 5.0–8.0)

## 2012-02-04 MED ORDER — SODIUM CHLORIDE 0.9 % IV BOLUS (SEPSIS)
1000.0000 mL | Freq: Once | INTRAVENOUS | Status: AC
  Administered 2012-02-04: 1000 mL via INTRAVENOUS

## 2012-02-04 MED ORDER — TETANUS-DIPHTH-ACELL PERTUSSIS 5-2.5-18.5 LF-MCG/0.5 IM SUSP
0.5000 mL | Freq: Once | INTRAMUSCULAR | Status: AC
  Administered 2012-02-04: 0.5 mL via INTRAMUSCULAR
  Filled 2012-02-04: qty 0.5

## 2012-02-04 MED ORDER — LIDOCAINE HCL 1 % IJ SOLN
INTRAMUSCULAR | Status: AC
  Administered 2012-02-04: 20 mL
  Filled 2012-02-04: qty 20

## 2012-02-04 NOTE — ED Notes (Addendum)
Per pt, fell when trying to stand and fell onto nightstand. Approximately 1.5 inch open laceration to left upper lip.

## 2012-02-04 NOTE — ED Notes (Signed)
Katie, PA at bedside for sutures.

## 2012-02-04 NOTE — ED Notes (Signed)
Pt reports falling into nightstand this morning.  States having some discomfort rated 4/10. Pt is A/O x4. Skin warm and dry. Respirations even and unlabored. NAD noted at this time.

## 2012-02-04 NOTE — ED Notes (Signed)
Suture cart place outside of patients room.

## 2012-02-04 NOTE — Discharge Instructions (Signed)
Follow up with Dr. Cato Mulligan tomorrow to have BP medications reviewed.  Please use a cane or walker for stability in the meantime.  You will need an appointment in 3-5 days as well for suture removal.  Signs of infection to watch for include fever, drainage of pus, increased swelling and spreading skin redness.  If you would like to see a Engineer, petroleum, there is a name provided for you.   You may return to the ER if symptoms worsen or you have any other concerns.

## 2012-02-04 NOTE — ED Provider Notes (Signed)
History     CSN: [id number]  Arrival date & time 02/04/12  1113   First MD Initiated Contact with Patient 02/04/12 1115      Chief Complaint  Patient presents with  . Lip Laceration    fall    (Consider location/radiation/quality/duration/timing/severity/associated sxs/prior treatment) HPI History provided by pt and patient's daughter.  Pt became lightheaded while getting out of bed this am, feel forward and hit her left lip on night stand.  She often feels lightheaded when she stands from seated position and attributes to all of the blood pressure medications she takes.  Her doctor is aware of this problem.  She sustained a laceration to left upper lip and has mild pain at this time.  Denies headache, neck/back pain or any other injuries.  No recent illnesses.  Last tetanus unknown.  Spoke to patient's daughter in private and she is concerned because pt has had multiple falls over the past year.  She does not use a walker or cane because too much pride and will not discuss it.  She lives by herself.    Past Medical History  Diagnosis Date  . Depression   . Diabetes mellitus   . GERD (gastroesophageal reflux disease)   . Hypertension   . Hyperlipidemia   . CVA (cerebral infarction)   . Aortic stenosis   . Osteoarthritis     Past Surgical History  Procedure Date  . Abdominal hysterectomy     fibroids, no cancer  . Joint replacement     left and right knee  . Rotator cuff repair   . Temporomandibular joint surgery   . Fracture surgery     left leg    Family History  Problem Relation Age of Onset  . Dementia Mother   . Hypertension Mother   . Heart attack Father   . Heart failure Sister     History  Substance Use Topics  . Smoking status: Former Smoker    Quit date: 02/07/1981  . Smokeless tobacco: Never Used  . Alcohol Use: No    OB History    Grav Para Term Preterm Abortions TAB SAB Ect Mult Living                  Review of Systems  All other systems  reviewed and are negative.    Allergies  Cymbalta; Hydrochlorothiazide; Oxycodone; Ramipril; and Telmisartan-hctz  Home Medications   Current Outpatient Rx  Name Route Sig Dispense Refill  . ASPIRIN 325 MG PO TABS Oral Take 325 mg by mouth every morning.     Marland Kitchen VITAMIN B 12 PO Oral Take 1,000 mcg by mouth every morning.     Marland Kitchen ESOMEPRAZOLE MAGNESIUM 40 MG PO CPDR Oral Take 40 mg by mouth daily before breakfast.    . FUROSEMIDE 20 MG PO TABS Oral Take 20 mg by mouth every morning.    . IMIPRAMINE HCL 25 MG PO TABS Oral Take 25 mg by mouth at bedtime.    Marland Kitchen LABETALOL HCL 300 MG PO TABS Oral Take 300 mg by mouth 2 (two) times daily.    Marland Kitchen ONE-DAILY MULTI VITAMINS PO TABS Oral Take 1 tablet by mouth daily.      Marland Kitchen POTASSIUM CHLORIDE CRYS ER 20 MEQ PO TBCR Oral Take 20 mEq by mouth daily.    Marland Kitchen SIMVASTATIN 20 MG PO TABS Oral Take 20 mg by mouth at bedtime.    Marland Kitchen VERAPAMIL HCL ER 300 MG PO CP24      .  SIMVASTATIN 20 MG PO TABS  TAKE 1 TABLET BY MOUTH AT BEDTIME 90 tablet 3    BP 129/71  Pulse 89  Temp(Src) 98.9 F (37.2 C) (Oral)  Resp 16  SpO2 98%  Physical Exam  Nursing note and vitals reviewed. Constitutional: She is oriented to person, place, and time. She appears well-developed and well-nourished. No distress.  HENT:  Head: Normocephalic and atraumatic.  Mouth/Throat: Oropharynx is clear and moist.       2.5 subq laceration left upper lip that crosses vermilion border but does not go through to buccal mucosa.  Hemostatic and clean.  Pt has scarring and deformity of left upper lip from prior injury.    Eyes:       Normal appearance  Neck: Normal range of motion.  Cardiovascular: Normal rate and regular rhythm.        Orthostatic BP  Pulmonary/Chest: Effort normal and breath sounds normal.  Neurological: She is alert and oriented to person, place, and time.  Skin: Skin is warm and dry. No rash noted.  Psychiatric: She has a normal mood and affect. Her behavior is normal.    ED  Course  Procedures (including critical care time)   Date: 02/04/2012  Rate: 83  Rhythm: normal sinus rhythm  QRS Axis: normal  Intervals: normal  ST/T Wave abnormalities: normal  Conduction Disutrbances:none  Narrative Interpretation:   Old EKG Reviewed: unchanged  LACERATION REPAIR Performed by: Otilio Miu Authorized by: Otilio Miu Consent: Verbal consent obtained. Risks and benefits: risks, benefits and alternatives were discussed Consent given by: patient Patient identity confirmed: provided demographic data Prepped and Draped in normal sterile fashion Wound explored  Laceration Location: left upper lip  Laceration Length: 2.5cm  No Foreign Bodies seen or palpated  Anesthesia: local infiltration  Local anesthetic: lidocaine 2% w/outepinephrine  Anesthetic total: 4 ml  Irrigation method: syringe Amount of cleaning: standard  Skin closure: prolene 5.0  Number of sutures: 9  Technique: simple interrupted  Patient tolerance: Patient tolerated the procedure well with no immediate complications.    Labs Reviewed  URINALYSIS, ROUTINE W REFLEX MICROSCOPIC - Abnormal; Notable for the following:    APPearance CLOUDY (*)    All other components within normal limits  POCT I-STAT, CHEM 8 - Abnormal; Notable for the following:    Glucose, Bld 156 (*)    All other components within normal limits   No results found.   1. Orthostatic hypotension   2. Fall   3. Lip laceration       MDM  77yo F presents w/ c/o fall w/ laceration to upper lip.  Denies headache or other neuro complaints and is not anticoagulated.  No neck/back pain and entire spine non-tender.  Orthostatic VS performed by nursing staff.  Systolic BP dropped from 135 laying down to 86 standing.  Pt received 1L IV NS.  EKG and labs unremarkable.  Laceration repaired and tetanus updated.  Pt advised to f/u with her PCP tomorrow for BP medication review and use a walker/cane for  stability in the meantime.  Signs of wound infection discussed.       Otilio Miu, [place] 02/04/12 1725

## 2012-02-05 ENCOUNTER — Ambulatory Visit: Payer: Medicare Other | Admitting: Family

## 2012-02-05 NOTE — ED Provider Notes (Signed)
Medical screening examination/treatment/procedure(s) were conducted as a shared visit with non-physician practitioner(s) and myself.  I personally evaluated the patient during the encounter   Markevius Trombetta A. Patrica Duel, MD 02/05/12 2252

## 2012-02-06 ENCOUNTER — Ambulatory Visit (INDEPENDENT_AMBULATORY_CARE_PROVIDER_SITE_OTHER): Payer: Medicare Other | Admitting: Family

## 2012-02-06 ENCOUNTER — Encounter: Payer: Self-pay | Admitting: Family

## 2012-02-06 DIAGNOSIS — S01511A Laceration without foreign body of lip, initial encounter: Secondary | ICD-10-CM

## 2012-02-06 DIAGNOSIS — R5381 Other malaise: Secondary | ICD-10-CM | POA: Diagnosis not present

## 2012-02-06 DIAGNOSIS — R5383 Other fatigue: Secondary | ICD-10-CM

## 2012-02-06 DIAGNOSIS — S01501A Unspecified open wound of lip, initial encounter: Secondary | ICD-10-CM | POA: Diagnosis not present

## 2012-02-06 DIAGNOSIS — I959 Hypotension, unspecified: Secondary | ICD-10-CM

## 2012-02-06 MED ORDER — LABETALOL HCL 200 MG PO TABS: 200.0000 mg | ORAL_TABLET | Freq: Two times a day (BID) | ORAL | Status: DC

## 2012-02-06 NOTE — Patient Instructions (Addendum)
1. Decrease Labetalol to 1/2 tab a day.   Hypotension As your heart beats, it forces blood through your arteries. This force is your blood pressure. If your blood pressure is too low for you to go about your normal activities or support the organs of your body, you have hypotension, or low blood pressure. When your blood pressure becomes too low, you may not get enough blood to your brain, and you may feel weak, lightheaded, or develop a more rapid heart rate. In a more severe case, you may faint: this is a sudden, brief loss of consciousness where you pass out and recover completely. CAUSES  Loss of blood or fluids from the body. This occurs during rapid blood loss. It can also come from dehydration when the body is not taking in enough fluids or is losing fluids faster than they can be replaced. Examples of this would be severe vomiting and diarrhea.   Not taking in enough fluids and salts. This is common in the elderly where thirst mechanisms are not working as well. This means you do not feel thirsty and you do not take in enough water.   Use of blood pressure pills and other medications that may lower the blood pressure below normal.   Over medication (always take your medications as directed).   Irregular heart beat or heart failure when the heart is no longer working well enough to support blood pressure.  Hospitalization is sometimes required for low blood pressure if fluid or blood replacement is needed, if time is needed for medications to wear off, or if further evaluation is needed. Less common causes of low blood pressure might include peripheral or autonomic neuropathy (nerve problems), Parkinson's disease, or other illnesses. Treatment might include a change in diet, change in medications (including medicines aimed at raising your blood pressure), and use of support stockings. HOME CARE INSTRUCTIONS   Maintain good fluid intake and use a little more salt on your food (if you are not  on a restricted diet or having problems with your heart such as heart failure). This is especially important for the elderly when you may not feel thirsty in the winter.   Take your medications as directed.   Get up slowly from reclining or sitting positions. This gives your blood pressure a chance to adjust. As we grow older our ability to regulate our blood pressure may not be as good as when we were younger.   Wear support stockings if prescribed.   Use walkers, canes, etc., if advised.   Talk with your physician or nurse about a Home Safety Evaluation (usually done by visiting nurses).  SEEK IMMEDIATE MEDICAL CARE IF:   You have a fainting episode. Do not drive yourself. Call 911 if no other help is available.   You have chest pain, nausea (feeling sick to your stomach) or vomiting.   You have a loss of feeling in some part of your body, or lose movement in your arms or legs.   You have difficulty with speech.   You become sweaty and/or feel light headed.  Make sure you are re-checked as instructed. MAKE SURE YOU:   Understand these instructions.   Will watch your condition.   Will get help right away if you are not doing well or get worse.  Document Released: 11/27/2005 Document Revised: 08/09/2011 Document Reviewed: 07/17/2008 [facility] Patient Information 2012 [place], [place].

## 2012-02-06 NOTE — Progress Notes (Signed)
Subjective:    Patient ID: [name], female    DOB: [birth year], 78 y.o.   MRN: [id number]  HPI 78 year old white female, nonsmoker, patient of Dr. sources and as a hospital followup after a fall. Patient reports standing and getting dizzy she fell and hit her upper lip on the corner of her dresser. She now has sutures to that area applied to the ED. Patient continues to have some lightheadedness and dizziness and an increase in fatigue. She had a full cardiac workup through the emergency department with labs were all within normal limits. Her blood pressure has been running in the low 100s. She currently takes labetalol 200 mg twice daily and verapamil 300 mg a day. She denies any chest pain, palpitations, shortness of breath or edema.   Review of Systems  Constitutional: Positive for fatigue.  HENT: Negative.   Respiratory: Negative.  Negative for shortness of breath.   Cardiovascular: Negative.  Negative for chest pain and palpitations.  Gastrointestinal: Negative.   Musculoskeletal: Negative.   Skin:       Sutures to the upper lip  Neurological: Positive for dizziness and light-headedness.  Hematological: Negative.   Psychiatric/Behavioral: Negative.       Past Medical History  Diagnosis Date  . Depression   . Diabetes mellitus   . GERD (gastroesophageal reflux disease)   . Hypertension   . Hyperlipidemia   . CVA (cerebral infarction)   . Aortic stenosis   . Osteoarthritis     History   Social History  . Marital Status: Widowed    Spouse Name: N/A    Number of Children: N/A  . Years of Education: N/A   Occupational History  . Not on file.   Social History Main Topics  . Smoking status: Former Smoker    Quit date: 02/07/1981  . Smokeless tobacco: Never Used  . Alcohol Use: No  . Drug Use: No  . Sexually Active: Not on file   Other Topics Concern  . Not on file   Social History Narrative  . No narrative on file    Past Surgical History  Procedure  Date  . Abdominal hysterectomy     fibroids, no cancer  . Joint replacement     left and right knee  . Rotator cuff repair   . Temporomandibular joint surgery   . Fracture surgery     left leg    Family History  Problem Relation Age of Onset  . Dementia Mother   . Hypertension Mother   . Heart attack Father   . Heart failure Sister     Allergies  Allergen Reactions  . Cymbalta (Duloxetine Hcl) Anaphylaxis  . Hydrochlorothiazide     REACTION: rash  . Oxycodone Other (See Comments)    Nightmares  . Ramipril     REACTION: difficulty breathing  . Telmisartan-Hctz     REACTION: rash    Current Outpatient Prescriptions on File Prior to Visit  Medication Sig Dispense Refill  . aspirin 325 MG tablet Take 325 mg by mouth every morning.       . Cyanocobalamin (VITAMIN B 12 PO) Take 1,000 mcg by mouth every morning.       Marland Kitchen esomeprazole (NEXIUM) 40 MG capsule Take 40 mg by mouth daily before breakfast.      . furosemide (LASIX) 20 MG tablet Take 20 mg by mouth every morning.      Marland Kitchen imipramine (TOFRANIL) 25 MG tablet Take 25 mg by mouth at bedtime.      Marland Kitchen  Multiple Vitamin (MULTIVITAMIN) tablet Take 1 tablet by mouth daily.        . potassium chloride SA (K-DUR,KLOR-CON) 20 MEQ tablet Take 20 mEq by mouth daily.      . simvastatin (ZOCOR) 20 MG tablet TAKE 1 TABLET BY MOUTH AT BEDTIME  90 tablet  3  . simvastatin (ZOCOR) 20 MG tablet Take 20 mg by mouth at bedtime.      . Verapamil HCl CR 300 MG CP24         BP 104/74  Pulse 95  Temp(Src) 98.2 F (36.8 C) (Oral)  Wt 209 lb (94.802 kg)  SpO2 96%chart  Objective:   Physical Exam  Constitutional: She is oriented to person, place, and time. She appears well-developed.  HENT:  Right Ear: External ear normal.  Left Ear: External ear normal.  Nose: Nose normal.  Mouth/Throat: Oropharynx is clear and moist.  Neck: Normal range of motion. Neck supple.  Cardiovascular: Normal rate, regular rhythm and normal heart sounds.     Pulmonary/Chest: Effort normal and breath sounds normal.  Musculoskeletal: Normal range of motion.  Neurological: She is oriented to person, place, and time.  Skin: Skin is warm and dry.       Sutures intact, patient will approximated and healing well. No signs and symptoms of infection  Psychiatric: She has a normal mood and affect.    Blood pressure recheck 110/70       Assessment & Plan:  Assessment: Hypotension, status post fall, dizziness, fatigue  Plan: Decrease labetalol 200 mg twice daily. Patient will return in 3 days for recheck with Dr. Kirtland Bouchard to have her sutures removed. Rest. Advised to go from a sitting to a standing position slowly so that her blood pressure doesn't continue to drop.

## 2012-02-09 ENCOUNTER — Ambulatory Visit (INDEPENDENT_AMBULATORY_CARE_PROVIDER_SITE_OTHER): Payer: Medicare Other | Admitting: Internal Medicine

## 2012-02-09 ENCOUNTER — Encounter: Payer: Self-pay | Admitting: Internal Medicine

## 2012-02-09 VITALS — BP 104/80 | Temp 98.1°F

## 2012-02-09 DIAGNOSIS — Z4802 Encounter for removal of sutures: Secondary | ICD-10-CM

## 2012-02-09 DIAGNOSIS — IMO0002 Reserved for concepts with insufficient information to code with codable children: Secondary | ICD-10-CM

## 2012-02-09 NOTE — Progress Notes (Signed)
  Subjective:    Patient ID: [name], female    DOB: 1934-07-19, 78 y.o.   MRN: [id number]  HPI  78 year old patient who is in today for suture removal. Sutures were placed 4 days ago after falling sustaining trauma to her upper lip region. She still has considerable soft tissue swelling.    Review of Systems     Objective:   Physical Exam   Considerable soft tissue swelling involving the upper lip area;  the wound appeared to be still fairly fresh.    Assessment & Plan:   Suture  Removal-  the patient has been asked to return in 3 days for suture removal. I'm worried about wound dehiscence  with all the soft tissue swelling  And  the freshness  of the sutures

## 2012-02-09 NOTE — Patient Instructions (Signed)
Return in 3 days for suture removal

## 2012-02-12 ENCOUNTER — Encounter: Payer: Self-pay | Admitting: Internal Medicine

## 2012-02-12 ENCOUNTER — Ambulatory Visit (INDEPENDENT_AMBULATORY_CARE_PROVIDER_SITE_OTHER): Payer: Medicare Other | Admitting: Internal Medicine

## 2012-02-12 VITALS — BP 100/60 | Temp 98.3°F

## 2012-02-12 DIAGNOSIS — Z4802 Encounter for removal of sutures: Secondary | ICD-10-CM

## 2012-02-12 NOTE — Patient Instructions (Signed)
Call or return to clinic prn if these symptoms worsen or fail to improve as anticipated.

## 2012-02-12 NOTE — Progress Notes (Signed)
  Subjective:    Patient ID: [name], female    DOB: 31-Jul-1934, 78 y.o.   MRN: [id number]  HPI  78 year old patient who was seen here for suture removal. She was seen here 3 days ago and was asked to return. Sutures were placed 7 days ago    Review of Systems     Objective:   Physical Exam   8 sutures removed from the left upper lip region     Assessment & Plan:    Suture  removal

## 2012-03-01 ENCOUNTER — Other Ambulatory Visit: Payer: Self-pay | Admitting: Internal Medicine

## 2012-03-21 ENCOUNTER — Other Ambulatory Visit: Payer: Self-pay | Admitting: Internal Medicine

## 2012-05-13 ENCOUNTER — Other Ambulatory Visit: Payer: Self-pay | Admitting: Internal Medicine

## 2012-05-13 ENCOUNTER — Other Ambulatory Visit: Payer: Self-pay

## 2012-05-13 DIAGNOSIS — E119 Type 2 diabetes mellitus without complications: Secondary | ICD-10-CM | POA: Diagnosis not present

## 2012-05-13 DIAGNOSIS — Z961 Presence of intraocular lens: Secondary | ICD-10-CM | POA: Diagnosis not present

## 2012-05-13 MED ORDER — LABETALOL HCL 200 MG PO TABS: 200.0000 mg | ORAL_TABLET | Freq: Two times a day (BID) | ORAL | Status: DC

## 2012-05-14 ENCOUNTER — Other Ambulatory Visit: Payer: Self-pay | Admitting: *Deleted

## 2012-05-14 MED ORDER — POTASSIUM CHLORIDE CRYS ER 20 MEQ PO TBCR: 20.0000 meq | EXTENDED_RELEASE_TABLET | Freq: Every day | ORAL | Status: DC

## 2012-06-04 ENCOUNTER — Ambulatory Visit: Payer: Medicare Other | Admitting: Internal Medicine

## 2012-06-20 ENCOUNTER — Ambulatory Visit: Payer: Medicare Other | Admitting: Internal Medicine

## 2012-06-27 ENCOUNTER — Encounter: Payer: Self-pay | Admitting: Internal Medicine

## 2012-06-27 ENCOUNTER — Ambulatory Visit (INDEPENDENT_AMBULATORY_CARE_PROVIDER_SITE_OTHER): Payer: Medicare Other | Admitting: Internal Medicine

## 2012-06-27 VITALS — BP 110/66 | Temp 98.5°F | Wt 216.0 lb

## 2012-06-27 DIAGNOSIS — R5383 Other fatigue: Secondary | ICD-10-CM

## 2012-06-27 DIAGNOSIS — K219 Gastro-esophageal reflux disease without esophagitis: Secondary | ICD-10-CM

## 2012-06-27 DIAGNOSIS — R5381 Other malaise: Secondary | ICD-10-CM | POA: Diagnosis not present

## 2012-06-27 DIAGNOSIS — M1990 Unspecified osteoarthritis, unspecified site: Secondary | ICD-10-CM

## 2012-06-27 DIAGNOSIS — F329 Major depressive disorder, single episode, unspecified: Secondary | ICD-10-CM | POA: Diagnosis not present

## 2012-06-27 DIAGNOSIS — I10 Essential (primary) hypertension: Secondary | ICD-10-CM | POA: Diagnosis not present

## 2012-06-27 DIAGNOSIS — E119 Type 2 diabetes mellitus without complications: Secondary | ICD-10-CM | POA: Diagnosis not present

## 2012-06-27 DIAGNOSIS — I359 Nonrheumatic aortic valve disorder, unspecified: Secondary | ICD-10-CM | POA: Diagnosis not present

## 2012-06-27 MED ORDER — LABETALOL HCL 100 MG PO TABS: ORAL_TABLET | ORAL | Status: DC

## 2012-06-27 NOTE — Progress Notes (Signed)
  Subjective:    Patient ID: [name], female    DOB: Apr 22, 1934, 78 y.o.   MRN: [id number]  HPI  78 year old patient who is seen today with a chief complaint of fatigue. She states it's been present for one month but this has been intermittent chronic problem. She does have a history of depression. Medical problems include hypertension which has been controlled with beta blocker therapy. This has been tapered in the past. She also has a history of dyslipidemia. She has been followed by a counselor in the past but not presently. No recent lab.    Review of Systems  Constitutional: Positive for fatigue.  HENT: Negative for hearing loss, congestion, sore throat, rhinorrhea, dental problem, sinus pressure and tinnitus.   Eyes: Negative for pain, discharge and visual disturbance.  Respiratory: Negative for cough and shortness of breath.   Cardiovascular: Negative for chest pain, palpitations and leg swelling.  Gastrointestinal: Negative for nausea, vomiting, abdominal pain, diarrhea, constipation, blood in stool and abdominal distention.  Genitourinary: Negative for dysuria, urgency, frequency, hematuria, flank pain, vaginal bleeding, vaginal discharge, difficulty urinating, vaginal pain and pelvic pain.  Musculoskeletal: Negative for joint swelling, arthralgias and gait problem.  Skin: Negative for rash.  Neurological: Positive for weakness. Negative for dizziness, syncope, speech difficulty, numbness and headaches.  Hematological: Negative for adenopathy.  Psychiatric/Behavioral: Positive for dysphoric mood. Negative for behavioral problems and agitation. The patient is not nervous/anxious.        Objective:   Physical Exam  Constitutional: She is oriented to person, place, and time. She appears well-developed and well-nourished.  HENT:  Head: Normocephalic.  Right Ear: External ear normal.  Left Ear: External ear normal.  Mouth/Throat: Oropharynx is clear and moist.  Eyes:  Conjunctivae and EOM are normal. Pupils are equal, round, and reactive to light.  Neck: Normal range of motion. Neck supple. No thyromegaly present.  Cardiovascular: Normal rate, regular rhythm, normal heart sounds and intact distal pulses.   Pulmonary/Chest: Effort normal and breath sounds normal.  Abdominal: Soft. Bowel sounds are normal. She exhibits no mass. There is no tenderness.  Musculoskeletal: Normal range of motion.  Lymphadenopathy:    She has no cervical adenopathy.  Neurological: She is alert and oriented to person, place, and time.  Skin: Skin is warm and dry. No rash noted.  Psychiatric: She has a normal mood and affect. Her behavior is normal.          Assessment & Plan:   Chronic fatigue. We'll taper and eventually discontinue Normodyne. We'll decrease to 100 mg twice daily for 2 weeks then 100 mg once daily. We'll recheck in 4 weeks Hypertension. Blood pressure presently a low-normal range we'll continue diuretic therapy as well as verapamil and hopefully we'll be able to completely taper and discontinue beta blocker therapy Osteoarthritis History depression. Depending on her clinical response and lab will consider referral back to behavioral health

## 2012-06-27 NOTE — Patient Instructions (Addendum)
Decrease labetalol as discussed  Return office visit 4 weeks  Limit your sodium (Salt) intake    It is important that you exercise regularly, at least 20 minutes 3 to 4 times per week.  If you develop chest pain or shortness of breath seek  medical attention.

## 2012-06-28 LAB — COMPREHENSIVE METABOLIC PANEL
ALT: 22 U/L (ref 0–35)
AST: 33 U/L (ref 0–37)
Alkaline Phosphatase: 67 U/L (ref 39–117)
Sodium: 138 mEq/L (ref 135–145)
Total Bilirubin: 0.5 mg/dL (ref 0.3–1.2)
Total Protein: 6.9 g/dL (ref 6.0–8.3)

## 2012-06-28 LAB — CBC WITH DIFFERENTIAL/PLATELET
Basophils Absolute: 0.1 10*3/uL (ref 0.0–0.1)
HCT: 42.4 % (ref 36.0–46.0)
Hemoglobin: 14.2 g/dL (ref 12.0–15.0)
Lymphs Abs: 1 10*3/uL (ref 0.7–4.0)
MCV: 91.9 fl (ref 78.0–100.0)
Monocytes Relative: 7.7 % (ref 3.0–12.0)
Neutro Abs: 4.9 10*3/uL (ref 1.4–7.7)
RDW: 13.1 % (ref 11.5–14.6)

## 2012-06-28 LAB — HEMOGLOBIN A1C: Hgb A1c MFr Bld: 7.2 % — ABNORMAL HIGH (ref 4.6–6.5)

## 2012-06-28 LAB — TSH: TSH: 1.21 u[IU]/mL (ref 0.35–5.50)

## 2012-07-29 ENCOUNTER — Ambulatory Visit: Payer: Medicare Other | Admitting: Internal Medicine

## 2012-08-05 ENCOUNTER — Encounter: Payer: Self-pay | Admitting: Internal Medicine

## 2012-08-05 ENCOUNTER — Ambulatory Visit (INDEPENDENT_AMBULATORY_CARE_PROVIDER_SITE_OTHER): Payer: Medicare Other | Admitting: Internal Medicine

## 2012-08-05 ENCOUNTER — Telehealth: Payer: Self-pay | Admitting: Internal Medicine

## 2012-08-05 VITALS — BP 110/80 | Wt 216.0 lb

## 2012-08-05 DIAGNOSIS — I10 Essential (primary) hypertension: Secondary | ICD-10-CM | POA: Diagnosis not present

## 2012-08-05 DIAGNOSIS — E119 Type 2 diabetes mellitus without complications: Secondary | ICD-10-CM | POA: Diagnosis not present

## 2012-08-05 DIAGNOSIS — E785 Hyperlipidemia, unspecified: Secondary | ICD-10-CM

## 2012-08-05 DIAGNOSIS — I359 Nonrheumatic aortic valve disorder, unspecified: Secondary | ICD-10-CM

## 2012-08-05 DIAGNOSIS — Z8679 Personal history of other diseases of the circulatory system: Secondary | ICD-10-CM

## 2012-08-05 MED ORDER — LABETALOL HCL 100 MG PO TABS: 100.0000 mg | ORAL_TABLET | Freq: Two times a day (BID) | ORAL | Status: DC

## 2012-08-05 MED ORDER — METFORMIN HCL 500 MG PO TABS: 500.0000 mg | ORAL_TABLET | Freq: Two times a day (BID) | ORAL | Status: DC

## 2012-08-05 NOTE — Telephone Encounter (Signed)
ok 

## 2012-08-05 NOTE — Progress Notes (Signed)
Subjective:    Patient ID: [name], female    DOB: 1934-06-16, 78 y.o.   MRN: [id number]  HPI  78 year old patient who is seen today for followup. She was seen one month ago with a chief complaint of fatigue. Laboratory screen was was unremarkable except for hyperglycemia and a hemoglobin A1c of 7.2. The patient has been on therapy for type 2 diabetes in the past. Creatinine was borderline high at 1.3. She continues to have fatigue. One month ago she was taking labetalol 200 mg twice a day. The plan was to slowly taper and discontinue but noted to have high blood pressure readings and continues on a dose of 100 mg twice daily at the present time.  She has noted no improvement on the lower beta blocker dose. She also complains of worsening dyspnea on exertion. She had a 2-D echocardiogram in 2007 that revealed trivial aortic stenosis. She does have a history of anxiety depression. She has remote history of cerebral vascular disease in 2001.  Past Medical History  Diagnosis Date  . Depression   . Diabetes mellitus   . GERD (gastroesophageal reflux disease)   . Hypertension   . Hyperlipidemia   . CVA (cerebral infarction)   . Aortic stenosis   . Osteoarthritis     History   Social History  . Marital Status: Widowed    Spouse Name: N/A    Number of Children: N/A  . Years of Education: N/A   Occupational History  . Not on file.   Social History Main Topics  . Smoking status: Former Smoker    Quit date: 02/07/1981  . Smokeless tobacco: Never Used  . Alcohol Use: No  . Drug Use: No  . Sexually Active: Not on file   Other Topics Concern  . Not on file   Social History Narrative  . No narrative on file    Past Surgical History  Procedure Date  . Abdominal hysterectomy     fibroids, no cancer  . Joint replacement     left and right knee  . Rotator cuff repair   . Temporomandibular joint surgery   . Fracture surgery     left leg    Family History  Problem  Relation Age of Onset  . Dementia Mother   . Hypertension Mother   . Heart attack Father   . Heart failure Sister     Allergies  Allergen Reactions  . Cymbalta (Duloxetine Hcl) Anaphylaxis  . Hydrochlorothiazide     REACTION: rash  . Oxycodone Other (See Comments)    Nightmares  . Ramipril     REACTION: difficulty breathing  . Telmisartan-Hctz     REACTION: rash    Current Outpatient Prescriptions on File Prior to Visit  Medication Sig Dispense Refill  . aspirin 325 MG tablet Take 325 mg by mouth every morning.       . Cyanocobalamin (VITAMIN B 12 PO) Take 1,000 mcg by mouth every morning.       Marland Kitchen esomeprazole (NEXIUM) 40 MG capsule Take 40 mg by mouth daily before breakfast.      . furosemide (LASIX) 20 MG tablet Take 20 mg by mouth every morning.      Marland Kitchen imipramine (TOFRANIL) 25 MG tablet Take 25 mg by mouth at bedtime.      Marland Kitchen labetalol (NORMODYNE) 100 MG tablet 1 tablet twice a day for 2 weeks and then one daily  60 tablet  2  . Multiple Vitamin (MULTIVITAMIN) tablet Take  1 tablet by mouth daily.        . potassium chloride SA (K-DUR,KLOR-CON) 20 MEQ tablet Take 1 tablet (20 mEq total) by mouth daily.  30 tablet  0  . simvastatin (ZOCOR) 20 MG tablet TAKE 1 TABLET BY MOUTH AT BEDTIME  90 tablet  3  . Verapamil HCl CR 300 MG CP24 TAKE ONE CAPSULE EVERY DAY  30 capsule  5    BP 110/80  Wt 216 lb (97.977 kg)  SpO2 96%       Review of Systems  Constitutional: Positive for fatigue.  HENT: Negative for hearing loss, congestion, sore throat, rhinorrhea, dental problem, sinus pressure and tinnitus.   Eyes: Negative for pain, discharge and visual disturbance.  Respiratory: Positive for shortness of breath. Negative for cough.   Cardiovascular: Negative for chest pain, palpitations and leg swelling.  Gastrointestinal: Negative for nausea, vomiting, abdominal pain, diarrhea, constipation, blood in stool and abdominal distention.  Genitourinary: Negative for dysuria, urgency,  frequency, hematuria, flank pain, vaginal bleeding, vaginal discharge, difficulty urinating, vaginal pain and pelvic pain.  Musculoskeletal: Negative for joint swelling, arthralgias and gait problem.  Skin: Negative for rash.  Neurological: Negative for dizziness, syncope, speech difficulty, weakness, numbness and headaches.  Hematological: Negative for adenopathy.  Psychiatric/Behavioral: Negative for behavioral problems, dysphoric mood and agitation. The patient is not nervous/anxious.        Objective:   Physical Exam  Constitutional: She is oriented to person, place, and time. She appears well-developed and well-nourished.       Overweight Blood pressure 140/80 in the left And 120/70 in the right  HENT:  Head: Normocephalic.  Right Ear: External ear normal.  Left Ear: External ear normal.  Mouth/Throat: Oropharynx is clear and moist.  Eyes: Conjunctivae and EOM are normal. Pupils are equal, round, and reactive to light.  Neck: Normal range of motion. Neck supple. No thyromegaly present.       Carotid upstroke diminutive  Cardiovascular: Normal rate, regular rhythm and intact distal pulses.   Murmur heard.      Grade 2/6 systolic murmur at the base  Pulmonary/Chest: Effort normal and breath sounds normal.  Abdominal: Soft. Bowel sounds are normal. She exhibits no mass. There is no tenderness.  Musculoskeletal: Normal range of motion.  Lymphadenopathy:    She has no cervical adenopathy.  Neurological: She is alert and oriented to person, place, and time.  Skin: Skin is warm and dry. No rash noted.  Psychiatric: She has a normal mood and affect. Her behavior is normal.          Assessment & Plan:  Fatigue and dyspnea on exertion. We'll check a 2-D echocardiogram to exclude worsening aortic stenosis Hypertension. Well controlled on her present regimen ; we'll continue; no improvement and fatigue with the reduction of beta blocker dose Diabetes mellitus. Hemoglobin A1c 7.2  we'll start metformin 500 mg twice a day with close followup of the serum creatinine  Recheck 4 weeks

## 2012-08-05 NOTE — Telephone Encounter (Signed)
Pt would like to switch from dr swords to Dr Kirtland Bouchard due to pt unable to have morning appts

## 2012-08-05 NOTE — Patient Instructions (Signed)
Limit your sodium (Salt) intake    It is important that you exercise regularly, at least 20 minutes 3 to 4 times per week.  If you develop chest pain or shortness of breath seek  medical attention.  You need to lose weight.  Consider a lower calorie diet and regular exercise. 

## 2012-08-06 NOTE — Telephone Encounter (Signed)
Pt is aware.  

## 2012-08-06 NOTE — Telephone Encounter (Signed)
[name], please see below. You are ok to schedule this pt.

## 2012-08-06 NOTE — Telephone Encounter (Signed)
ok 

## 2012-08-13 ENCOUNTER — Other Ambulatory Visit (HOSPITAL_COMMUNITY): Payer: Medicare Other

## 2012-08-19 ENCOUNTER — Ambulatory Visit (HOSPITAL_COMMUNITY): Payer: Medicare Other | Attending: Internal Medicine

## 2012-08-19 DIAGNOSIS — I080 Rheumatic disorders of both mitral and aortic valves: Secondary | ICD-10-CM | POA: Insufficient documentation

## 2012-08-19 DIAGNOSIS — I359 Nonrheumatic aortic valve disorder, unspecified: Secondary | ICD-10-CM | POA: Diagnosis not present

## 2012-08-19 DIAGNOSIS — Z87891 Personal history of nicotine dependence: Secondary | ICD-10-CM | POA: Insufficient documentation

## 2012-08-19 DIAGNOSIS — I369 Nonrheumatic tricuspid valve disorder, unspecified: Secondary | ICD-10-CM | POA: Diagnosis not present

## 2012-08-19 DIAGNOSIS — E119 Type 2 diabetes mellitus without complications: Secondary | ICD-10-CM | POA: Diagnosis not present

## 2012-08-19 DIAGNOSIS — I10 Essential (primary) hypertension: Secondary | ICD-10-CM | POA: Diagnosis not present

## 2012-08-19 NOTE — Progress Notes (Signed)
Quick Note:  Attempt to call- hm# - VM - LMTCB if questions - test normal , tried cell# - "disconnected" ______

## 2012-08-19 NOTE — Progress Notes (Signed)
Echocardiogram performed.  

## 2012-08-27 ENCOUNTER — Other Ambulatory Visit: Payer: Self-pay | Admitting: Internal Medicine

## 2012-09-15 ENCOUNTER — Other Ambulatory Visit: Payer: Self-pay | Admitting: Internal Medicine

## 2012-09-16 ENCOUNTER — Ambulatory Visit (INDEPENDENT_AMBULATORY_CARE_PROVIDER_SITE_OTHER): Payer: Medicare Other | Admitting: Internal Medicine

## 2012-09-16 ENCOUNTER — Ambulatory Visit (INDEPENDENT_AMBULATORY_CARE_PROVIDER_SITE_OTHER)
Admission: RE | Admit: 2012-09-16 | Discharge: 2012-09-16 | Disposition: A | Discharge status: Home / Self Care | Payer: Medicare Other | Source: Ambulatory Visit | Attending: Internal Medicine | Admitting: Internal Medicine

## 2012-09-16 ENCOUNTER — Encounter: Payer: Self-pay | Admitting: Internal Medicine

## 2012-09-16 VITALS — BP 118/72 | Temp 98.1°F

## 2012-09-16 DIAGNOSIS — R05 Cough: Secondary | ICD-10-CM | POA: Diagnosis not present

## 2012-09-16 DIAGNOSIS — Z23 Encounter for immunization: Secondary | ICD-10-CM | POA: Diagnosis not present

## 2012-09-16 DIAGNOSIS — I10 Essential (primary) hypertension: Secondary | ICD-10-CM | POA: Diagnosis not present

## 2012-09-16 DIAGNOSIS — E119 Type 2 diabetes mellitus without complications: Secondary | ICD-10-CM

## 2012-09-16 DIAGNOSIS — R0602 Shortness of breath: Secondary | ICD-10-CM

## 2012-09-16 LAB — GLUCOSE, POCT (MANUAL RESULT ENTRY): POC Glucose: 228 mg/dl — AB (ref 70–99)

## 2012-09-16 MED ORDER — LABETALOL HCL 100 MG PO TABS: 100.0000 mg | ORAL_TABLET | Freq: Two times a day (BID) | ORAL | Status: DC

## 2012-09-16 MED ORDER — GLUCOSE BLOOD VI STRP: 1.0000 | ORAL_STRIP | Freq: Every day | Status: DC

## 2012-09-16 NOTE — Patient Instructions (Addendum)
Limit your sodium (Salt) intake  Please check your blood pressure on a regular basis.  If it is consistently greater than 150/90, please make an office appointment.    It is important that you exercise regularly, at least 20 minutes 3 to 4 times per week.  If you develop chest pain or shortness of breath seek  medical attention.  You need to lose weight.  Consider a lower calorie diet and regular exercise.  Taper and discontinue labetalol as discussed

## 2012-09-16 NOTE — Progress Notes (Signed)
Subjective:    Patient ID: [name], female    DOB: [birth year], 78 y.o.   MRN: [id number]  HPI  78 year old patient who is seen today for followup. She was started on metformin recently due 2 diabetes and a hemoglobin A1c of 7.2. Presently she is taking 500 mg once daily. Twice daily regimen caused diarrhea. Random blood sugar this afternoon 228. She was seen 6 weeks ago complaining of some of exertional fatigue and some shortness of breath. A 2-D echocardiogram was unremarkable. The patient had a remote history of mild AS.  Presently she complains of more dyspnea on exertion. She states she gets short of breath after walking 100 feet. Due to the fatigue labetalol has been tapered Discontinued tobacco 40 years ago. Husband died of complications of lung cancer  Past Medical History  Diagnosis Date  . Depression   . Diabetes mellitus   . GERD (gastroesophageal reflux disease)   . Hypertension   . Hyperlipidemia   . CVA (cerebral infarction)   . Aortic stenosis   . Osteoarthritis     History   Social History  . Marital Status: Widowed    Spouse Name: N/A    Number of Children: N/A  . Years of Education: N/A   Occupational History  . Not on file.   Social History Main Topics  . Smoking status: Former Smoker    Quit date: 02/07/1981  . Smokeless tobacco: Never Used  . Alcohol Use: No  . Drug Use: No  . Sexually Active: Not on file   Other Topics Concern  . Not on file   Social History Narrative  . No narrative on file    Past Surgical History  Procedure Date  . Abdominal hysterectomy     fibroids, no cancer  . Joint replacement     left and right knee  . Rotator cuff repair   . Temporomandibular joint surgery   . Fracture surgery     left leg    Family History  Problem Relation Age of Onset  . Dementia Mother   . Hypertension Mother   . Heart attack Father   . Heart failure Sister     Allergies  Allergen Reactions  . Cymbalta (Duloxetine Hcl)  Anaphylaxis  . Hydrochlorothiazide     REACTION: rash  . Oxycodone Other (See Comments)    Nightmares  . Ramipril     REACTION: difficulty breathing  . Telmisartan-Hctz     REACTION: rash    Current Outpatient Prescriptions on File Prior to Visit  Medication Sig Dispense Refill  . aspirin 325 MG tablet Take 325 mg by mouth every morning.       . Cyanocobalamin (VITAMIN B 12 PO) Take 1,000 mcg by mouth every morning.       Marland Kitchen esomeprazole (NEXIUM) 40 MG capsule Take 40 mg by mouth daily before breakfast.      . furosemide (LASIX) 20 MG tablet Take 20 mg by mouth every morning.      . furosemide (LASIX) 20 MG tablet TAKE 1 TABLET BY MOUTH EVERY DAY  90 tablet  1  . imipramine (TOFRANIL) 25 MG tablet Take 25 mg by mouth at bedtime.      Marland Kitchen labetalol (NORMODYNE) 100 MG tablet Take 1 tablet (100 mg total) by mouth 2 (two) times daily.  180 tablet  3  . metFORMIN (GLUCOPHAGE) 500 MG tablet Take 1 tablet (500 mg total) by mouth 2 (two) times daily with a meal.  180 tablet  3  . Multiple Vitamin (MULTIVITAMIN) tablet Take 1 tablet by mouth daily.        Marland Kitchen NEXIUM 40 MG capsule TAKE ONE CAPSULE BY MOUTH EVERY DAY  90 capsule  3  . potassium chloride SA (K-DUR,KLOR-CON) 20 MEQ tablet Take 1 tablet (20 mEq total) by mouth daily.  30 tablet  0  . simvastatin (ZOCOR) 20 MG tablet TAKE 1 TABLET BY MOUTH AT BEDTIME  90 tablet  3  . Verapamil HCl CR 300 MG CP24 TAKE ONE CAPSULE EVERY DAY  30 capsule  5    BP 118/72  Temp 98.1 F (36.7 C) (Oral)       Review of Systems  Constitutional: Positive for fatigue.  HENT: Negative for hearing loss, congestion, sore throat, rhinorrhea, dental problem, sinus pressure and tinnitus.   Eyes: Negative for pain, discharge and visual disturbance.  Respiratory: Negative for cough and shortness of breath (DOE).   Cardiovascular: Negative for chest pain, palpitations and leg swelling.  Gastrointestinal: Negative for nausea, vomiting, abdominal pain, diarrhea,  constipation, blood in stool and abdominal distention.  Genitourinary: Negative for dysuria, urgency, frequency, hematuria, flank pain, vaginal bleeding, vaginal discharge, difficulty urinating, vaginal pain and pelvic pain.  Musculoskeletal: Negative for joint swelling, arthralgias and gait problem.  Skin: Negative for rash.  Neurological: Negative for dizziness, syncope, speech difficulty, weakness, numbness and headaches.  Hematological: Negative for adenopathy.  Psychiatric/Behavioral: Negative for behavioral problems, dysphoric mood and agitation. The patient is not nervous/anxious.        Objective:   Physical Exam  Constitutional: She is oriented to person, place, and time. She appears well-developed and well-nourished. No distress.       Blood pressure 110/70 Obese Afebrile  HENT:  Head: Normocephalic.  Right Ear: External ear normal.  Left Ear: External ear normal.  Mouth/Throat: Oropharynx is clear and moist.  Eyes: Conjunctivae normal and EOM are normal. Pupils are equal, round, and reactive to light.  Neck: Normal range of motion. Neck supple. No thyromegaly present.  Cardiovascular: Normal rate, regular rhythm, normal heart sounds and intact distal pulses.   Pulmonary/Chest: Effort normal and breath sounds normal. No respiratory distress. She has no wheezes. She has no rales.       O2 saturation 96%  Abdominal: Soft. Bowel sounds are normal. She exhibits no mass. There is no tenderness.  Musculoskeletal: Normal range of motion.  Lymphadenopathy:    She has no cervical adenopathy.  Neurological: She is alert and oriented to person, place, and time.  Skin: Skin is warm and dry. No rash noted.  Psychiatric: She has a normal mood and affect. Her behavior is normal.          Assessment & Plan:   Progressive dyspnea on exertion Trivial aortic stenosis Hypertension well controlled. We'll continue tapering and discontinuation of labetalol. Continue verapamil and  diuretic therapy  Recheck 6 weeks

## 2012-09-16 NOTE — Telephone Encounter (Signed)
Pt changed to Dr Kirtland Bouchard

## 2012-09-23 ENCOUNTER — Other Ambulatory Visit: Payer: Self-pay | Admitting: Internal Medicine

## 2012-10-07 ENCOUNTER — Other Ambulatory Visit: Payer: Self-pay | Admitting: *Deleted

## 2012-10-07 MED ORDER — GLUCOSE BLOOD VI STRP: 1.0000 | ORAL_STRIP | Freq: Every day | Status: DC

## 2012-10-28 ENCOUNTER — Ambulatory Visit: Payer: Medicare Other | Admitting: Internal Medicine

## 2012-11-04 ENCOUNTER — Encounter: Payer: Self-pay | Admitting: Internal Medicine

## 2012-11-04 ENCOUNTER — Ambulatory Visit (INDEPENDENT_AMBULATORY_CARE_PROVIDER_SITE_OTHER): Payer: Medicare Other | Admitting: Internal Medicine

## 2012-11-04 VITALS — BP 150/90 | HR 92 | Temp 97.8°F | Resp 18 | Wt 199.0 lb

## 2012-11-04 DIAGNOSIS — I359 Nonrheumatic aortic valve disorder, unspecified: Secondary | ICD-10-CM

## 2012-11-04 DIAGNOSIS — E119 Type 2 diabetes mellitus without complications: Secondary | ICD-10-CM | POA: Diagnosis not present

## 2012-11-04 DIAGNOSIS — I10 Essential (primary) hypertension: Secondary | ICD-10-CM | POA: Diagnosis not present

## 2012-11-04 MED ORDER — VERAPAMIL HCL ER 360 MG PO CP24: 360.0000 mg | ORAL_CAPSULE | Freq: Every day | ORAL | Status: DC

## 2012-11-04 MED ORDER — FUROSEMIDE 40 MG PO TABS: 40.0000 mg | ORAL_TABLET | Freq: Every day | ORAL | Status: DC

## 2012-11-04 NOTE — Patient Instructions (Addendum)
Limit your sodium (Salt) intake  You need to lose weight.  Consider a lower calorie diet and regular exercise.  Please check your blood pressure on a regular basis.  If it is consistently greater than 150/90, please make an office appointment.  Return in 3 months for follow-up  

## 2012-11-04 NOTE — Progress Notes (Signed)
Subjective:    Patient ID: [name], female    DOB: May 19, 1934, 78 y.o.   MRN: [id number]  HPI  78 year old patient who is seen today for followup of hypertension. The patient has been on verapamil and do to dyspnea on exertion labetalol has been tapered and discontinued. She has a history of trivial aortic stenosis.  She has significant allergies to hydrochlorothiazide and the ARB and ACE classes.  She has lost a significant amount of weight over the past 3 months. Blood pressure has increased modestly since discontinuation of beta blocker therapy. She feels well and the DOE has resolved  Wt Readings from Last 3 Encounters:  11/04/12 199 lb (90.266 kg)  08/05/12 216 lb (97.977 kg)  06/27/12 216 lb (97.977 kg)    Past Medical History  Diagnosis Date  . Depression   . Diabetes mellitus   . GERD (gastroesophageal reflux disease)   . Hypertension   . Hyperlipidemia   . CVA (cerebral infarction)   . Aortic stenosis   . Osteoarthritis     History   Social History  . Marital Status: Widowed    Spouse Name: N/A    Number of Children: N/A  . Years of Education: N/A   Occupational History  . Not on file.   Social History Main Topics  . Smoking status: Former Smoker    Quit date: 02/07/1981  . Smokeless tobacco: Never Used  . Alcohol Use: No  . Drug Use: No  . Sexually Active: Not on file   Other Topics Concern  . Not on file   Social History Narrative  . No narrative on file    Past Surgical History  Procedure Date  . Abdominal hysterectomy     fibroids, no cancer  . Joint replacement     left and right knee  . Rotator cuff repair   . Temporomandibular joint surgery   . Fracture surgery     left leg    Family History  Problem Relation Age of Onset  . Dementia Mother   . Hypertension Mother   . Heart attack Father   . Heart failure Sister     Allergies  Allergen Reactions  . Cymbalta (Duloxetine Hcl) Anaphylaxis  . Hydrochlorothiazide    REACTION: rash  . Oxycodone Other (See Comments)    Nightmares  . Ramipril     REACTION: difficulty breathing  . Telmisartan-Hctz     REACTION: rash    Current Outpatient Prescriptions on File Prior to Visit  Medication Sig Dispense Refill  . aspirin 325 MG tablet Take 325 mg by mouth every morning.       . Cyanocobalamin (VITAMIN B 12 PO) Take 1,000 mcg by mouth every morning.       Marland Kitchen esomeprazole (NEXIUM) 40 MG capsule Take 40 mg by mouth daily before breakfast.      . glucose blood (FREESTYLE TEST STRIPS) test strip 1 each by Other route daily. Use as instructed  100 each  12  . imipramine (TOFRANIL) 25 MG tablet Take 25 mg by mouth at bedtime.      . metFORMIN (GLUCOPHAGE) 500 MG tablet Take 1 tablet (500 mg total) by mouth 2 (two) times daily with a meal.  180 tablet  3  . Multiple Vitamin (MULTIVITAMIN) tablet Take 1 tablet by mouth daily.        Marland Kitchen NEXIUM 40 MG capsule TAKE ONE CAPSULE BY MOUTH EVERY DAY  90 capsule  3  . potassium chloride SA (K-DUR,KLOR-CON)  20 MEQ tablet Take 1 tablet (20 mEq total) by mouth daily.  30 tablet  0  . simvastatin (ZOCOR) 20 MG tablet TAKE 1 TABLET BY MOUTH AT BEDTIME  90 tablet  3    BP 150/90  Pulse 92  Temp 97.8 F (36.6 C) (Oral)  Resp 18  Wt 199 lb (90.266 kg)  SpO2 98%     Review of Systems  Constitutional: Negative.   HENT: Negative for hearing loss, congestion, sore throat, rhinorrhea, dental problem, sinus pressure and tinnitus.   Eyes: Negative for pain, discharge and visual disturbance.  Respiratory: Negative for cough and shortness of breath.   Cardiovascular: Negative for chest pain, palpitations and leg swelling.  Gastrointestinal: Negative for nausea, vomiting, abdominal pain, diarrhea, constipation, blood in stool and abdominal distention.  Genitourinary: Negative for dysuria, urgency, frequency, hematuria, flank pain, vaginal bleeding, vaginal discharge, difficulty urinating, vaginal pain and pelvic pain.    Musculoskeletal: Negative for joint swelling, arthralgias and gait problem.  Skin: Negative for rash.  Neurological: Negative for dizziness, syncope, speech difficulty, weakness, numbness and headaches.  Hematological: Negative for adenopathy.  Psychiatric/Behavioral: Negative for behavioral problems, dysphoric mood and agitation. The patient is not nervous/anxious.        Objective:   Physical Exam  Constitutional: She is oriented to person, place, and time. She appears well-developed and well-nourished.       Blood pressure 140 over 90  HENT:  Head: Normocephalic.  Right Ear: External ear normal.  Left Ear: External ear normal.  Mouth/Throat: Oropharynx is clear and moist.  Eyes: Conjunctivae normal and EOM are normal. Pupils are equal, round, and reactive to light.  Neck: Normal range of motion. Neck supple. No thyromegaly present.  Cardiovascular: Normal rate, regular rhythm, normal heart sounds and intact distal pulses.   Pulmonary/Chest: Effort normal and breath sounds normal.  Abdominal: Soft. Bowel sounds are normal. She exhibits no mass. There is no tenderness.  Musculoskeletal: Normal range of motion.  Lymphadenopathy:    She has no cervical adenopathy.  Neurological: She is alert and oriented to person, place, and time.  Skin: Skin is warm and dry. No rash noted.  Psychiatric: She has a normal mood and affect. Her behavior is normal.          Assessment & Plan:   Hypertension. We'll increase varapamil to 360 daily and increase furosemide to 40 mg daily. We'll continue home blood pressure monitoring. Low-salt diet additional weight loss all encouraged. We'll recheck in 3 months or when necessary. She will call if blood pressures are consistently over 150 overnight

## 2012-12-27 ENCOUNTER — Other Ambulatory Visit: Payer: Self-pay | Admitting: Internal Medicine

## 2013-01-06 ENCOUNTER — Other Ambulatory Visit: Payer: Self-pay | Admitting: Internal Medicine

## 2013-03-14 ENCOUNTER — Telehealth: Payer: Self-pay | Admitting: Internal Medicine

## 2013-03-14 MED ORDER — GLUCOSE BLOOD VI STRP: 1.0000 | ORAL_STRIP | Freq: Every day | Status: DC

## 2013-03-14 NOTE — Telephone Encounter (Signed)
Pt needs refill on freestyle test strips call into [facility]. Pt is out

## 2013-03-14 NOTE — Telephone Encounter (Signed)
Pt notified Rx refill for test strips was sent to pharmacy.

## 2013-04-17 ENCOUNTER — Ambulatory Visit (INDEPENDENT_AMBULATORY_CARE_PROVIDER_SITE_OTHER): Payer: Medicare Other | Admitting: Internal Medicine

## 2013-04-17 ENCOUNTER — Encounter: Payer: Self-pay | Admitting: Internal Medicine

## 2013-04-17 VITALS — BP 140/90 | HR 96 | Resp 20 | Wt 188.0 lb

## 2013-04-17 DIAGNOSIS — I739 Peripheral vascular disease, unspecified: Secondary | ICD-10-CM

## 2013-04-17 DIAGNOSIS — E785 Hyperlipidemia, unspecified: Secondary | ICD-10-CM

## 2013-04-17 DIAGNOSIS — I10 Essential (primary) hypertension: Secondary | ICD-10-CM

## 2013-04-17 DIAGNOSIS — E119 Type 2 diabetes mellitus without complications: Secondary | ICD-10-CM | POA: Diagnosis not present

## 2013-04-17 NOTE — Progress Notes (Signed)
Subjective:    Patient ID: [name], female    DOB: 06-09-1934, 79 y.o.   MRN: [id number]  HPI  79 year old patient who has treated hypertension type 2 diabetes and dyslipidemia. She is seen today for her six-month followup. No recent hemoglobin A 1C. She has been controlled on metformin therapy alone. She has treated hypertension which has been managed with verapamil. Denies any claudication but she has noted bluish discoloration of the toes of both feet. Remote tobacco use  Past Medical History  Diagnosis Date  . Depression   . Diabetes mellitus   . GERD (gastroesophageal reflux disease)   . Hypertension   . Hyperlipidemia   . CVA (cerebral infarction)   . Aortic stenosis   . Osteoarthritis     History   Social History  . Marital Status: Widowed    Spouse Name: N/A    Number of Children: N/A  . Years of Education: N/A   Occupational History  . Not on file.   Social History Main Topics  . Smoking status: Former Smoker    Quit date: 02/07/1981  . Smokeless tobacco: Never Used  . Alcohol Use: No  . Drug Use: No  . Sexually Active: Not on file   Other Topics Concern  . Not on file   Social History Narrative  . No narrative on file    Past Surgical History  Procedure Laterality Date  . Abdominal hysterectomy      fibroids, no cancer  . Joint replacement      left and right knee  . Rotator cuff repair    . Temporomandibular joint surgery    . Fracture surgery      left leg    Family History  Problem Relation Age of Onset  . Dementia Mother   . Hypertension Mother   . Heart attack Father   . Heart failure Sister     Allergies  Allergen Reactions  . Cymbalta (Duloxetine Hcl) Anaphylaxis  . Hydrochlorothiazide     REACTION: rash  . Oxycodone Other (See Comments)    Nightmares  . Ramipril     REACTION: difficulty breathing  . Telmisartan-Hctz     REACTION: rash    Current Outpatient Prescriptions on File Prior to Visit  Medication Sig  Dispense Refill  . aspirin 325 MG tablet Take 325 mg by mouth every morning.       . Cyanocobalamin (VITAMIN B 12 PO) Take 1,000 mcg by mouth every morning.       . furosemide (LASIX) 40 MG tablet Take 1 tablet (40 mg total) by mouth daily.  90 tablet  3  . glucose blood (FREESTYLE TEST STRIPS) test strip 1 each by Other route daily. Use as instructed  100 each  12  . imipramine (TOFRANIL) 25 MG tablet TAKE 1 TABLET BY MOUTH ONCE A DAY  90 tablet  3  . metFORMIN (GLUCOPHAGE) 500 MG tablet Take 1 tablet (500 mg total) by mouth 2 (two) times daily with a meal.  180 tablet  3  . Multiple Vitamin (MULTIVITAMIN) tablet Take 1 tablet by mouth daily.        Marland Kitchen NEXIUM 40 MG capsule TAKE ONE CAPSULE BY MOUTH EVERY DAY  90 capsule  3  . potassium chloride SA (K-DUR,KLOR-CON) 20 MEQ tablet Take 1 tablet (20 mEq total) by mouth daily.  30 tablet  0  . simvastatin (ZOCOR) 20 MG tablet TAKE 1 TABLET BY MOUTH AT BEDTIME  90 tablet  3  .  verapamil (VERELAN PM) 360 MG 24 hr capsule Take 1 capsule (360 mg total) by mouth at bedtime.  90 capsule  6   No current facility-administered medications on file prior to visit.    BP 140/90  Pulse 96  Resp 20  Wt 188 lb (85.276 kg)  BMI 32.25 kg/m2  SpO2 97%       Review of Systems  Constitutional: Negative.   HENT: Negative for hearing loss, congestion, sore throat, rhinorrhea, dental problem, sinus pressure and tinnitus.   Eyes: Negative for pain, discharge and visual disturbance.  Respiratory: Negative for cough and shortness of breath.   Cardiovascular: Negative for chest pain, palpitations and leg swelling.  Gastrointestinal: Negative for nausea, vomiting, abdominal pain, diarrhea, constipation, blood in stool and abdominal distention.  Genitourinary: Negative for dysuria, urgency, frequency, hematuria, flank pain, vaginal bleeding, vaginal discharge, difficulty urinating, vaginal pain and pelvic pain.  Musculoskeletal: Negative for joint swelling,  arthralgias and gait problem.  Skin: Negative for rash.  Neurological: Negative for dizziness, syncope, speech difficulty, weakness, numbness and headaches.  Hematological: Negative for adenopathy.  Psychiatric/Behavioral: Negative for behavioral problems, dysphoric mood and agitation. The patient is not nervous/anxious.        Objective:   Physical Exam  Constitutional: She is oriented to person, place, and time. She appears well-developed and well-nourished.  Repeat blood pressure 120/82  HENT:  Head: Normocephalic.  Right Ear: External ear normal.  Left Ear: External ear normal.  Mouth/Throat: Oropharynx is clear and moist.  Eyes: Conjunctivae and EOM are normal. Pupils are equal, round, and reactive to light.  Neck: Normal range of motion. Neck supple. No thyromegaly present.  Cardiovascular: Normal rate, regular rhythm and normal heart sounds.   Pedal pulses not easily palpable  Pulmonary/Chest: Effort normal and breath sounds normal.  Abdominal: Soft. Bowel sounds are normal. She exhibits no mass. There is no tenderness.  Musculoskeletal: Normal range of motion.  The toes of both feet were slightly cool and cyanotic. Cyanosis  blanched with pressure. May have had a small healing ischemic ulcer involving her right great toe  Lymphadenopathy:    She has no cervical adenopathy.  Neurological: She is alert and oriented to person, place, and time.  Skin: Skin is warm and dry. No rash noted.  Psychiatric: She has a normal mood and affect. Her behavior is normal.          Assessment & Plan:   Hypertension Dyslipidemia Type 2 diabetes. We'll check a hemoglobin A1c Rule out significant peripheral vascular disease in view of diminished pedal pulses and cyanosis of the toes

## 2013-04-17 NOTE — Patient Instructions (Addendum)
Arterial Doppler study as discussed   Please check your hemoglobin A1c every 3 months  Limit your sodium (Salt) intake    It is important that you exercise regularly, at least 20 minutes 3 to 4 times per week.  If you develop chest pain or shortness of breath seek  medical attention.

## 2013-05-09 ENCOUNTER — Encounter (INDEPENDENT_AMBULATORY_CARE_PROVIDER_SITE_OTHER): Payer: Medicare Other

## 2013-05-09 DIAGNOSIS — E785 Hyperlipidemia, unspecified: Secondary | ICD-10-CM

## 2013-05-09 DIAGNOSIS — E1159 Type 2 diabetes mellitus with other circulatory complications: Secondary | ICD-10-CM | POA: Diagnosis not present

## 2013-05-09 DIAGNOSIS — I10 Essential (primary) hypertension: Secondary | ICD-10-CM

## 2013-05-09 DIAGNOSIS — I739 Peripheral vascular disease, unspecified: Secondary | ICD-10-CM

## 2013-05-09 DIAGNOSIS — E119 Type 2 diabetes mellitus without complications: Secondary | ICD-10-CM

## 2013-05-20 ENCOUNTER — Other Ambulatory Visit: Payer: Self-pay | Admitting: Internal Medicine

## 2013-08-05 ENCOUNTER — Ambulatory Visit (INDEPENDENT_AMBULATORY_CARE_PROVIDER_SITE_OTHER): Payer: Medicare Other | Admitting: Internal Medicine

## 2013-08-05 ENCOUNTER — Encounter: Payer: Self-pay | Admitting: Internal Medicine

## 2013-08-05 VITALS — BP 100/64 | HR 100 | Temp 98.3°F | Resp 20 | Wt 192.0 lb

## 2013-08-05 DIAGNOSIS — E119 Type 2 diabetes mellitus without complications: Secondary | ICD-10-CM | POA: Diagnosis not present

## 2013-08-05 DIAGNOSIS — I10 Essential (primary) hypertension: Secondary | ICD-10-CM

## 2013-08-05 DIAGNOSIS — M1990 Unspecified osteoarthritis, unspecified site: Secondary | ICD-10-CM | POA: Diagnosis not present

## 2013-08-05 MED ORDER — ZOLPIDEM TARTRATE 5 MG PO TABS: 5.0000 mg | ORAL_TABLET | Freq: Every evening | ORAL | Status: DC | PRN

## 2013-08-05 NOTE — Patient Instructions (Addendum)
Limit your sodium (Salt) intake    It is important that you exercise regularly, at least 20 minutes 3 to 4 times per week.  If you develop chest pain or shortness of breath seek  medical attention.   Please check your hemoglobin A1c every 3 months  Insomnia Insomnia is frequent trouble falling and/or staying asleep. Insomnia can be a long term problem or a short term problem. Both are common. Insomnia can be a short term problem when the wakefulness is related to a certain stress or worry. Long term insomnia is often related to ongoing stress during waking hours and/or poor sleeping habits. Overtime, sleep deprivation itself can make the problem worse. Every little thing feels more severe because you are overtired and your ability to cope is decreased. CAUSES   Stress, anxiety, and depression.  Poor sleeping habits.  Distractions such as TV in the bedroom.  Naps close to bedtime.  Engaging in emotionally charged conversations before bed.  Technical reading before sleep.  Alcohol and other sedatives. They may make the problem worse. They can hurt normal sleep patterns and normal dream activity.  Stimulants such as caffeine for several hours prior to bedtime.  Pain syndromes and shortness of breath can cause insomnia.  Exercise late at night.  Changing time zones may cause sleeping problems (jet lag). It is sometimes helpful to have someone observe your sleeping patterns. They should look for periods of not breathing during the night (sleep apnea). They should also look to see how long those periods last. If you live alone or observers are uncertain, you can also be observed at a sleep clinic where your sleep patterns will be professionally monitored. Sleep apnea requires a checkup and treatment. Give your caregivers your medical history. Give your caregivers observations your family has made about your sleep.  SYMPTOMS   Not feeling rested in the morning.  Anxiety and restlessness  at bedtime.  Difficulty falling and staying asleep. TREATMENT   Your caregiver may prescribe treatment for an underlying medical disorders. Your caregiver can give advice or help if you are using alcohol or other drugs for self-medication. Treatment of underlying problems will usually eliminate insomnia problems.  Medications can be prescribed for short time use. They are generally not recommended for lengthy use.  Over-the-counter sleep medicines are not recommended for lengthy use. They can be habit forming.  You can promote easier sleeping by making lifestyle changes such as:  Using relaxation techniques that help with breathing and reduce muscle tension.  Exercising earlier in the day.  Changing your diet and the time of your last meal. No night time snacks.  Establish a regular time to go to bed.  Counseling can help with stressful problems and worry.  Soothing music and white noise may be helpful if there are background noises you cannot remove.  Stop tedious detailed work at least one hour before bedtime. HOME CARE INSTRUCTIONS   Keep a diary. Inform your caregiver about your progress. This includes any medication side effects. See your caregiver regularly. Take note of:  Times when you are asleep.  Times when you are awake during the night.  The quality of your sleep.  How you feel the next day. This information will help your caregiver care for you.  Get out of bed if you are still awake after 15 minutes. Read or do some quiet activity. Keep the lights down. Wait until you feel sleepy and go back to bed.  Keep regular sleeping and waking  hours. Avoid naps.  Exercise regularly.  Avoid distractions at bedtime. Distractions include watching television or engaging in any intense or detailed activity like attempting to balance the household checkbook.  Develop a bedtime ritual. Keep a familiar routine of bathing, brushing your teeth, climbing into bed at the same  time each night, listening to soothing music. Routines increase the success of falling to sleep faster.  Use relaxation techniques. This can be using breathing and muscle tension release routines. It can also include visualizing peaceful scenes. You can also help control troubling or intruding thoughts by keeping your mind occupied with boring or repetitive thoughts like the old concept of counting sheep. You can make it more creative like imagining planting one beautiful flower after another in your backyard garden.  During your day, work to eliminate stress. When this is not possible use some of the previous suggestions to help reduce the anxiety that accompanies stressful situations. MAKE SURE YOU:   Understand these instructions.  Will watch your condition.  Will get help right away if you are not doing well or get worse. Document Released: 11/24/2000 Document Revised: 02/19/2012 Document Reviewed: 12/25/2007 [facility] Patient Information 2014 [place], [place].

## 2013-08-05 NOTE — Progress Notes (Signed)
Subjective:    Patient ID: Maurine Minister, female    DOB: [birth year], 79 y.o.   MRN: [id number]  HPI  79 year old patient who is seen today for followup of diabetes. She is scheduled for ophthalmology followup in 2 days. Her last hemoglobin A1c 6.5. No recent home blood sugar monitoring. In general she is doing quite well Her main complaint today is insomnia. Her sleep hygiene is very erratic. She is a avid reader and often reads well until early morning and sleeps in. She takes afternoon naps. She often reads in bed. She has hypertension and dyslipidemia.  Past Medical History  Diagnosis Date  . Depression   . Diabetes mellitus   . GERD (gastroesophageal reflux disease)   . Hypertension   . Hyperlipidemia   . CVA (cerebral infarction)   . Aortic stenosis   . Osteoarthritis     History   Social History  . Marital Status: Widowed    Spouse Name: N/A    Number of Children: N/A  . Years of Education: N/A   Occupational History  . Not on file.   Social History Main Topics  . Smoking status: Former Smoker    Quit date: 02/07/1981  . Smokeless tobacco: Never Used  . Alcohol Use: No  . Drug Use: No  . Sexual Activity: Not on file   Other Topics Concern  . Not on file   Social History Narrative  . No narrative on file    Past Surgical History  Procedure Laterality Date  . Abdominal hysterectomy      fibroids, no cancer  . Joint replacement      left and right knee  . Rotator cuff repair    . Temporomandibular joint surgery    . Fracture surgery      left leg    Family History  Problem Relation Age of Onset  . Dementia Mother   . Hypertension Mother   . Heart attack Father   . Heart failure Sister     Allergies  Allergen Reactions  . Cymbalta [Duloxetine Hcl] Anaphylaxis  . Hydrochlorothiazide     REACTION: rash  . Oxycodone Other (See Comments)    Nightmares  . Ramipril     REACTION: difficulty breathing  . Telmisartan-Hctz     REACTION: rash     Current Outpatient Prescriptions on File Prior to Visit  Medication Sig Dispense Refill  . aspirin 325 MG tablet Take 325 mg by mouth every morning.       . Cyanocobalamin (VITAMIN B 12 PO) Take 1,000 mcg by mouth every morning.       . furosemide (LASIX) 40 MG tablet Take 1 tablet (40 mg total) by mouth daily.  90 tablet  3  . glucose blood (FREESTYLE TEST STRIPS) test strip 1 each by Other route daily. Use as instructed  100 each  12  . imipramine (TOFRANIL) 25 MG tablet TAKE 1 TABLET BY MOUTH ONCE A DAY  90 tablet  3  . metFORMIN (GLUCOPHAGE) 500 MG tablet Take 1 tablet (500 mg total) by mouth 2 (two) times daily with a meal.  180 tablet  3  . Multiple Vitamin (MULTIVITAMIN) tablet Take 1 tablet by mouth daily.        Marland Kitchen NEXIUM 40 MG capsule TAKE ONE CAPSULE BY MOUTH EVERY DAY  90 capsule  3  . potassium chloride SA (KLOR-CON M20) 20 MEQ tablet 1 tablet daily  90 tablet  3  . simvastatin (ZOCOR) 20 MG tablet  TAKE 1 TABLET BY MOUTH AT BEDTIME  90 tablet  3  . verapamil (VERELAN PM) 360 MG 24 hr capsule Take 1 capsule (360 mg total) by mouth at bedtime.  90 capsule  6   No current facility-administered medications on file prior to visit.    BP 130/86  Pulse 100  Temp(Src) 98.3 F (36.8 C) (Oral)  Resp 20  Wt 192 lb (87.091 kg)  BMI 32.94 kg/m2  SpO2 97%       Review of Systems  Constitutional: Negative.   HENT: Negative for hearing loss, congestion, sore throat, rhinorrhea, dental problem, sinus pressure and tinnitus.   Eyes: Negative for pain, discharge and visual disturbance.  Respiratory: Negative for cough and shortness of breath.   Cardiovascular: Negative for chest pain, palpitations and leg swelling.  Gastrointestinal: Negative for nausea, vomiting, abdominal pain, diarrhea, constipation, blood in stool and abdominal distention.  Genitourinary: Negative for dysuria, urgency, frequency, hematuria, flank pain, vaginal bleeding, vaginal discharge, difficulty urinating,  vaginal pain and pelvic pain.  Musculoskeletal: Negative for joint swelling, arthralgias and gait problem.  Skin: Negative for rash.  Neurological: Negative for dizziness, syncope, speech difficulty, weakness, numbness and headaches.  Hematological: Negative for adenopathy.  Psychiatric/Behavioral: Positive for sleep disturbance. Negative for behavioral problems, dysphoric mood and agitation. The patient is not nervous/anxious.        Objective:   Physical Exam  Constitutional: She is oriented to person, place, and time. She appears well-developed and well-nourished.  Repeat blood pressure 100/64  HENT:  Head: Normocephalic.  Right Ear: External ear normal.  Left Ear: External ear normal.  Mouth/Throat: Oropharynx is clear and moist.  Eyes: Conjunctivae and EOM are normal. Pupils are equal, round, and reactive to light.  Neck: Normal range of motion. Neck supple. No thyromegaly present.  Cardiovascular: Normal rate, regular rhythm, normal heart sounds and intact distal pulses.   Pulmonary/Chest: Effort normal and breath sounds normal.  Abdominal: Soft. Bowel sounds are normal. She exhibits no mass. There is no tenderness.  Musculoskeletal: Normal range of motion.  Lymphadenopathy:    She has no cervical adenopathy.  Neurological: She is alert and oriented to person, place, and time.  Skin: Skin is warm and dry. No rash noted.  Psychiatric: She has a normal mood and affect. Her behavior is normal.          Assessment & Plan:   Diabetes mellitus. Appears to be under excellent control. Pathology followup this week. We'll check a hemoglobin A1c. We'll continue metformin therapy Hypertension well controlled Dyslipidemia. Continue statin therapy Insomnia. Sleep hygiene issues discussed. Information dispensed. Will treat with short-term low-dose Ambien

## 2013-08-06 LAB — HEMOGLOBIN A1C: Hgb A1c MFr Bld: 6.5 % (ref 4.6–6.5)

## 2013-08-07 DIAGNOSIS — E119 Type 2 diabetes mellitus without complications: Secondary | ICD-10-CM | POA: Diagnosis not present

## 2013-08-07 DIAGNOSIS — Z961 Presence of intraocular lens: Secondary | ICD-10-CM | POA: Diagnosis not present

## 2013-08-07 LAB — HM DIABETES EYE EXAM

## 2013-08-13 ENCOUNTER — Encounter: Payer: Self-pay | Admitting: Internal Medicine

## 2013-08-27 ENCOUNTER — Other Ambulatory Visit: Payer: Self-pay | Admitting: Internal Medicine

## 2013-09-02 ENCOUNTER — Telehealth: Payer: Self-pay | Admitting: Internal Medicine

## 2013-09-02 NOTE — Telephone Encounter (Signed)
Pt states the pharm is telling her it is too soon for her refill. They will not give her her metFORMIN (GLUCOPHAGE) 500 MG tablet Can you help? [facility]

## 2013-09-03 NOTE — Telephone Encounter (Signed)
Called [facility] and spoke to Newtonia, she said pt picked up last Rx on 7/30 and it was a 90 day supply. Neysa Bonito said she will contact pt and make sure she received full amount of Rx sometimes the bottles are not correct. Told her okay thank you.

## 2013-09-03 NOTE — Telephone Encounter (Signed)
Spoke to pt told her the pharmacy will be calling her to straighten out Rx for her. Pt verbalized understanding.

## 2013-09-24 ENCOUNTER — Other Ambulatory Visit: Payer: Self-pay | Admitting: Internal Medicine

## 2013-11-03 ENCOUNTER — Encounter: Payer: Self-pay | Admitting: Internal Medicine

## 2013-11-04 ENCOUNTER — Ambulatory Visit: Payer: Medicare Other | Admitting: Internal Medicine

## 2013-11-07 ENCOUNTER — Ambulatory Visit: Payer: Medicare Other | Admitting: Internal Medicine

## 2013-11-11 ENCOUNTER — Other Ambulatory Visit: Payer: Self-pay | Admitting: Internal Medicine

## 2013-11-12 ENCOUNTER — Encounter: Payer: Self-pay | Admitting: *Deleted

## 2013-11-13 ENCOUNTER — Ambulatory Visit (INDEPENDENT_AMBULATORY_CARE_PROVIDER_SITE_OTHER): Payer: Medicare Other | Admitting: Internal Medicine

## 2013-11-13 ENCOUNTER — Encounter: Payer: Self-pay | Admitting: Internal Medicine

## 2013-11-13 VITALS — BP 138/90 | HR 90 | Temp 97.9°F | Resp 22 | Wt 193.0 lb

## 2013-11-13 DIAGNOSIS — E785 Hyperlipidemia, unspecified: Secondary | ICD-10-CM

## 2013-11-13 DIAGNOSIS — E119 Type 2 diabetes mellitus without complications: Secondary | ICD-10-CM

## 2013-11-13 DIAGNOSIS — Z23 Encounter for immunization: Secondary | ICD-10-CM | POA: Diagnosis not present

## 2013-11-13 DIAGNOSIS — I10 Essential (primary) hypertension: Secondary | ICD-10-CM

## 2013-11-13 DIAGNOSIS — I359 Nonrheumatic aortic valve disorder, unspecified: Secondary | ICD-10-CM | POA: Diagnosis not present

## 2013-11-13 NOTE — Progress Notes (Signed)
Pre-visit discussion using our clinic review tool. No additional management support is needed unless otherwise documented below in the visit note.  

## 2013-11-13 NOTE — Progress Notes (Signed)
Subjective:    Patient ID: [name], female    DOB: 1934-05-27, 79 y.o.   MRN: [id number]  HPI Pre-visit discussion using our clinic review tool. No additional management support is needed unless otherwise documented below in the visit note.  79 year old patient who is seen today for followup. She has type 2 diabetes hypertension mild aortic stenosis. She is doing quite well. She did have an annual eye examination 2 months ago. No concerns or complaints. She has a history of osteoarthritis which has been stable.  Past Medical History  Diagnosis Date  . Depression   . Diabetes mellitus   . GERD (gastroesophageal reflux disease)   . Hypertension   . Hyperlipidemia   . CVA (cerebral infarction)   . Aortic stenosis   . Osteoarthritis     History   Social History  . Marital Status: Widowed    Spouse Name: N/A    Number of Children: N/A  . Years of Education: N/A   Occupational History  . Not on file.   Social History Main Topics  . Smoking status: Former Smoker    Quit date: 02/07/1981  . Smokeless tobacco: Never Used  . Alcohol Use: No  . Drug Use: No  . Sexual Activity: Not on file   Other Topics Concern  . Not on file   Social History Narrative  . No narrative on file    Past Surgical History  Procedure Laterality Date  . Abdominal hysterectomy      fibroids, no cancer  . Joint replacement      left and right knee  . Rotator cuff repair    . Temporomandibular joint surgery    . Fracture surgery      left leg    Family History  Problem Relation Age of Onset  . Dementia Mother   . Hypertension Mother   . Heart attack Father   . Heart failure Sister     Allergies  Allergen Reactions  . Cymbalta [Duloxetine Hcl] Anaphylaxis  . Hydrochlorothiazide     REACTION: rash  . Oxycodone Other (See Comments)    Nightmares  . Ramipril     REACTION: difficulty breathing  . Telmisartan-Hctz     REACTION: rash    Current Outpatient Prescriptions  on File Prior to Visit  Medication Sig Dispense Refill  . aspirin 325 MG tablet Take 325 mg by mouth every morning.       . Cyanocobalamin (VITAMIN B 12 PO) Take 1,000 mcg by mouth every morning.       . furosemide (LASIX) 40 MG tablet TAKE 1 TABLET EVERY DAY  90 tablet  1  . glucose blood (FREESTYLE TEST STRIPS) test strip 1 each by Other route daily. Use as instructed  100 each  12  . imipramine (TOFRANIL) 25 MG tablet TAKE 1 TABLET BY MOUTH ONCE A DAY  90 tablet  3  . metFORMIN (GLUCOPHAGE) 500 MG tablet TAKE 1 TABLET TWICE A DAY WITH A MEAL  180 tablet  3  . Multiple Vitamin (MULTIVITAMIN) tablet Take 1 tablet by mouth daily.        Marland Kitchen NEXIUM 40 MG capsule TAKE ONE CAPSULE BY MOUTH EVERY DAY  90 capsule  3  . potassium chloride SA (KLOR-CON M20) 20 MEQ tablet 1 tablet daily  90 tablet  3  . simvastatin (ZOCOR) 20 MG tablet TAKE 1 TABLET BY MOUTH AT BEDTIME  90 tablet  3  . verapamil (VERELAN PM) 360 MG 24  hr capsule TAKE ONE CAPSULE AT BEDTIME  90 capsule  1  . zolpidem (AMBIEN) 5 MG tablet Take 1 tablet (5 mg total) by mouth at bedtime as needed for sleep.  15 tablet  1   No current facility-administered medications on file prior to visit.    BP 138/90  Pulse 90  Temp(Src) 97.9 F (36.6 C) (Oral)  Resp 22  Wt 193 lb (87.544 kg)  SpO2 97%        Review of Systems  Constitutional: Negative.   HENT: Negative for congestion, dental problem, hearing loss, rhinorrhea, sinus pressure, sore throat and tinnitus.   Eyes: Negative for pain, discharge and visual disturbance.  Respiratory: Negative for cough and shortness of breath.   Cardiovascular: Negative for chest pain, palpitations and leg swelling.  Gastrointestinal: Negative for nausea, vomiting, abdominal pain, diarrhea, constipation, blood in stool and abdominal distention.  Genitourinary: Negative for dysuria, urgency, frequency, hematuria, flank pain, vaginal bleeding, vaginal discharge, difficulty urinating, vaginal pain  and pelvic pain.  Musculoskeletal: Negative for arthralgias, gait problem and joint swelling.  Skin: Negative for rash.  Neurological: Negative for dizziness, syncope, speech difficulty, weakness, numbness and headaches.  Hematological: Negative for adenopathy.  Psychiatric/Behavioral: Negative for behavioral problems, dysphoric mood and agitation. The patient is not nervous/anxious.        Objective:   Physical Exam  Constitutional: She is oriented to person, place, and time. She appears well-developed and well-nourished.  HENT:  Head: Normocephalic.  Right Ear: External ear normal.  Left Ear: External ear normal.  Mouth/Throat: Oropharynx is clear and moist.  Eyes: Conjunctivae and EOM are normal. Pupils are equal, round, and reactive to light.  Neck: Normal range of motion. Neck supple. No thyromegaly present.  Cardiovascular: Normal rate, regular rhythm, normal heart sounds and intact distal pulses.   Pulmonary/Chest: Effort normal and breath sounds normal.  Abdominal: Soft. Bowel sounds are normal. She exhibits no mass. There is no tenderness.  Musculoskeletal: Normal range of motion.  Lymphadenopathy:    She has no cervical adenopathy.  Neurological: She is alert and oriented to person, place, and time.  Skin: Skin is warm and dry. No rash noted.  Psychiatric: She has a normal mood and affect. Her behavior is normal.          Assessment & Plan:  Diabetes mellitus. Stable class II hemoglobin A1c is 6.5. We'll see in 3 months for annual exam hypertension well controlled. Repeat blood pressure 122/78 Dyslipidemia. We'll check a lipid profile in 3 months

## 2013-11-13 NOTE — Patient Instructions (Signed)
Please check your hemoglobin A1c every 3 months  Limit your sodium (Salt) intake    It is important that you exercise regularly, at least 20 minutes 3 to 4 times per week.  If you develop chest pain or shortness of breath seek  medical attention.  You need to lose weight.  Consider a lower calorie diet and regular exercise. 

## 2013-12-26 ENCOUNTER — Other Ambulatory Visit: Payer: Self-pay | Admitting: Internal Medicine

## 2014-01-09 ENCOUNTER — Other Ambulatory Visit: Payer: Self-pay | Admitting: Internal Medicine

## 2014-02-23 ENCOUNTER — Encounter: Payer: Medicare Other | Admitting: Internal Medicine

## 2014-03-24 ENCOUNTER — Ambulatory Visit (INDEPENDENT_AMBULATORY_CARE_PROVIDER_SITE_OTHER): Payer: Medicare Other | Admitting: Internal Medicine

## 2014-03-24 ENCOUNTER — Encounter: Payer: Self-pay | Admitting: Internal Medicine

## 2014-03-24 VITALS — BP 146/90 | HR 88 | Temp 97.8°F | Resp 20 | Ht 64.0 in | Wt 190.0 lb

## 2014-03-24 DIAGNOSIS — M1990 Unspecified osteoarthritis, unspecified site: Secondary | ICD-10-CM

## 2014-03-24 DIAGNOSIS — I359 Nonrheumatic aortic valve disorder, unspecified: Secondary | ICD-10-CM | POA: Diagnosis not present

## 2014-03-24 DIAGNOSIS — E785 Hyperlipidemia, unspecified: Secondary | ICD-10-CM | POA: Diagnosis not present

## 2014-03-24 DIAGNOSIS — I10 Essential (primary) hypertension: Secondary | ICD-10-CM

## 2014-03-24 DIAGNOSIS — F329 Major depressive disorder, single episode, unspecified: Secondary | ICD-10-CM | POA: Diagnosis not present

## 2014-03-24 DIAGNOSIS — F3289 Other specified depressive episodes: Secondary | ICD-10-CM | POA: Diagnosis not present

## 2014-03-24 DIAGNOSIS — Z8679 Personal history of other diseases of the circulatory system: Secondary | ICD-10-CM | POA: Diagnosis not present

## 2014-03-24 DIAGNOSIS — E119 Type 2 diabetes mellitus without complications: Secondary | ICD-10-CM

## 2014-03-24 DIAGNOSIS — Z0000 Encounter for general adult medical examination without abnormal findings: Secondary | ICD-10-CM

## 2014-03-24 LAB — MICROALBUMIN / CREATININE URINE RATIO
CREATININE, U: 75.9 mg/dL
MICROALB UR: 0.2 mg/dL (ref 0.0–1.9)
MICROALB/CREAT RATIO: 0.3 mg/g (ref 0.0–30.0)

## 2014-03-24 LAB — CBC WITH DIFFERENTIAL/PLATELET
BASOS PCT: 0.6 % (ref 0.0–3.0)
Basophils Absolute: 0 10*3/uL (ref 0.0–0.1)
EOS ABS: 0.1 10*3/uL (ref 0.0–0.7)
EOS PCT: 0.8 % (ref 0.0–5.0)
HCT: 44 % (ref 36.0–46.0)
Hemoglobin: 14.6 g/dL (ref 12.0–15.0)
LYMPHS PCT: 22.2 % (ref 12.0–46.0)
Lymphs Abs: 1.4 10*3/uL (ref 0.7–4.0)
MCHC: 33.1 g/dL (ref 30.0–36.0)
MCV: 91.3 fl (ref 78.0–100.0)
MONO ABS: 0.6 10*3/uL (ref 0.1–1.0)
Monocytes Relative: 8.8 % (ref 3.0–12.0)
NEUTROS PCT: 67.6 % (ref 43.0–77.0)
Neutro Abs: 4.2 10*3/uL (ref 1.4–7.7)
PLATELETS: 251 10*3/uL (ref 150.0–400.0)
RBC: 4.82 Mil/uL (ref 3.87–5.11)
RDW: 13.4 % (ref 11.5–14.6)
WBC: 6.3 10*3/uL (ref 4.5–10.5)

## 2014-03-24 LAB — COMPREHENSIVE METABOLIC PANEL
ALBUMIN: 4.1 g/dL (ref 3.5–5.2)
ALT: 19 U/L (ref 0–35)
AST: 25 U/L (ref 0–37)
Alkaline Phosphatase: 61 U/L (ref 39–117)
BUN: 22 mg/dL (ref 6–23)
CALCIUM: 9.6 mg/dL (ref 8.4–10.5)
CHLORIDE: 97 meq/L (ref 96–112)
CO2: 28 meq/L (ref 19–32)
CREATININE: 1.2 mg/dL (ref 0.4–1.2)
GFR: 46.62 mL/min — AB (ref >60.00)
GLUCOSE: 111 mg/dL — AB (ref 70–99)
POTASSIUM: 3.8 meq/L (ref 3.5–5.1)
Sodium: 137 mEq/L (ref 135–145)
Total Bilirubin: 0.7 mg/dL (ref 0.3–1.2)
Total Protein: 7.5 g/dL (ref 6.0–8.3)

## 2014-03-24 LAB — LIPID PANEL
CHOLESTEROL: 142 mg/dL (ref 0–200)
HDL: 52.1 mg/dL (ref >39.00)
LDL Cholesterol: 55 mg/dL (ref 0–99)
TRIGLYCERIDES: 175 mg/dL — AB (ref 0.0–149.0)
Total CHOL/HDL Ratio: 3
VLDL: 35 mg/dL (ref 0.0–40.0)

## 2014-03-24 LAB — TSH: TSH: 1.3 u[IU]/mL (ref 0.35–5.50)

## 2014-03-24 LAB — HEMOGLOBIN A1C: Hgb A1c MFr Bld: 6.6 % — ABNORMAL HIGH (ref 4.6–6.5)

## 2014-03-24 NOTE — Progress Notes (Signed)
Pre-visit discussion using our clinic review tool. No additional management support is needed unless otherwise documented below in the visit note.  

## 2014-03-24 NOTE — Patient Instructions (Signed)
Limit your sodium (Salt) intake    It is important that you exercise regularly, at least 20 minutes 3 to 4 times per week.  If you develop chest pain or shortness of breath seek  medical attention.  You need to lose weight.  Consider a lower calorie diet and regular exercise.  Return in 6 months for follow-up   

## 2014-03-24 NOTE — Progress Notes (Signed)
Subjective:    Patient ID: [name], female    DOB: 03/09/1934, 80 y.o.   MRN: [id number]  HPI 80 year old patient who is seen today for a wellness exam.  Medical problems include diabetes, dyslipidemia, and hypertension. She has a remote history of tobacco use, but discontinued after approximately 15 years of use. She states that she has DOE that is from present for the past 4 months.  She states that when she walks on a treadmill.  She gets short of breath after 2 or 3 minutes.  She does have a history of very mild A. S;  review of her chart reveals complaints in the past of dyspnea on exertion, as well as fatigue. Diabetes has been very well controlled. She has a history of cyanosis of the the feet of approximately 10 years duration.  No claudication  Past Medical History  Diagnosis Date  . Depression   . Diabetes mellitus   . GERD (gastroesophageal reflux disease)   . Hypertension   . Hyperlipidemia   . CVA (cerebral infarction)   . Aortic stenosis   . Osteoarthritis     History   Social History  . Marital Status: Widowed    Spouse Name: N/A    Number of Children: N/A  . Years of Education: N/A   Occupational History  . Not on file.   Social History Main Topics  . Smoking status: Former Smoker    Quit date: 02/07/1981  . Smokeless tobacco: Never Used  . Alcohol Use: No  . Drug Use: No  . Sexual Activity: Not on file   Other Topics Concern  . Not on file   Social History Narrative  . No narrative on file    Past Surgical History  Procedure Laterality Date  . Abdominal hysterectomy      fibroids, no cancer  . Joint replacement      left and right knee  . Rotator cuff repair    . Temporomandibular joint surgery    . Fracture surgery      left leg    Family History  Problem Relation Age of Onset  . Dementia Mother   . Hypertension Mother   . Heart attack Father   . Heart failure Sister     Allergies  Allergen Reactions  . Cymbalta  [Duloxetine Hcl] Anaphylaxis  . Hydrochlorothiazide     REACTION: rash  . Oxycodone Other (See Comments)    Nightmares  . Ramipril     REACTION: difficulty breathing  . Telmisartan-Hctz     REACTION: rash    Current Outpatient Prescriptions on File Prior to Visit  Medication Sig Dispense Refill  . aspirin 325 MG tablet Take 325 mg by mouth every morning.       . Cyanocobalamin (VITAMIN B 12 PO) Take 1,000 mcg by mouth every morning.       . furosemide (LASIX) 40 MG tablet TAKE 1 TABLET EVERY DAY  90 tablet  1  . glucose blood (FREESTYLE TEST STRIPS) test strip 1 each by Other route daily. Use as instructed  100 each  12  . imipramine (TOFRANIL) 25 MG tablet TAKE 1 TABLET BY MOUTH ONCE A DAY  90 tablet  1  . metFORMIN (GLUCOPHAGE) 500 MG tablet TAKE 1 TABLET TWICE A DAY WITH A MEAL  180 tablet  3  . Multiple Vitamin (MULTIVITAMIN) tablet Take 1 tablet by mouth daily.        Marland Kitchen NEXIUM 40 MG capsule TAKE ONE  CAPSULE BY MOUTH EVERY DAY  90 capsule  3  . potassium chloride SA (KLOR-CON M20) 20 MEQ tablet 1 tablet daily  90 tablet  3  . simvastatin (ZOCOR) 20 MG tablet TAKE 1 TABLET BY MOUTH AT BEDTIME  90 tablet  3  . verapamil (VERELAN PM) 360 MG 24 hr capsule TAKE ONE CAPSULE AT BEDTIME  90 capsule  1  . zolpidem (AMBIEN) 5 MG tablet Take 1 tablet (5 mg total) by mouth at bedtime as needed for sleep.  15 tablet  1   No current facility-administered medications on file prior to visit.    BP 146/90  Pulse 88  Temp(Src) 97.8 F (36.6 C) (Oral)  Resp 20  Ht 5\' 4"  (1.626 m)  Wt 190 lb (86.183 kg)  BMI 32.60 kg/m2  SpO2 98%   1. Risk factors, based on past  M,S,F history.    The risk factors include diabetes, hypertension, dyslipidemia, and remote tobacco use  2.  Physical activities: Fairly active and attempts at exercise regularly.  Does use a home treadmill.  Complaining of some dyspnea on exertion  3.  Depression/mood: History depression, but no longer on therapy.  4.   Hearing: No major deficits 5.  ADL's: Independent in all aspects of daily living 6.  Fall risk: Moderate due to weight and age  80.  Home safety: No problems identified  8.  Height weight, and visual acuity; height and weight stable.  No change in visual acuity does have annual eye examination  9.  Counseling: Heart healthy diet, weight loss encouraged  10. Lab orders based on risk factors: Laboratory profile be reviewed including hemoglobin A1c and urine for microalbumin  11. Referral : Not appropriate at this time  12. Care plan: Continue aggressive risk factor modification  13. Cognitive assessment: Alert and oriented with normal affect.  No cognitive dysfunction       Review of Systems  Constitutional: Positive for fatigue. Negative for fever, appetite change and unexpected weight change.  HENT: Negative for congestion, dental problem, ear pain, hearing loss, mouth sores, nosebleeds, sinus pressure, sore throat, tinnitus, trouble swallowing and voice change.   Eyes: Negative for photophobia, pain, redness and visual disturbance.  Respiratory: Positive for shortness of breath. Negative for cough and chest tightness.   Cardiovascular: Negative for chest pain, palpitations and leg swelling.  Gastrointestinal: Negative for nausea, vomiting, abdominal pain, diarrhea, constipation, blood in stool, abdominal distention and rectal pain.  Genitourinary: Negative for dysuria, urgency, frequency, hematuria, flank pain, vaginal bleeding, vaginal discharge, difficulty urinating, genital sores, vaginal pain, menstrual problem and pelvic pain.  Musculoskeletal: Negative for arthralgias, back pain and neck stiffness.  Skin: Negative for rash.  Neurological: Negative for dizziness, syncope, speech difficulty, weakness, light-headedness, numbness and headaches.  Hematological: Negative for adenopathy. Does not bruise/bleed easily.  Psychiatric/Behavioral: Negative for suicidal ideas, behavioral  problems, self-injury, dysphoric mood and agitation. The patient is not nervous/anxious.        Objective:   Physical Exam  Constitutional: She is oriented to person, place, and time. She appears well-developed and well-nourished.  HENT:  Head: Normocephalic and atraumatic.  Right Ear: External ear normal.  Left Ear: External ear normal.  Mouth/Throat: Oropharynx is clear and moist.  Eyes: Conjunctivae and EOM are normal.  Neck: Normal range of motion. Neck supple. No JVD present. No thyromegaly present.  Cardiovascular: Normal rate, regular rhythm, normal heart sounds and intact distal pulses.   No murmur heard. No significant murmur noted today  Pedal pulses intact except for an absent right dorsalis pedis pulse  Pulmonary/Chest: Effort normal and breath sounds normal. She has no wheezes. She has no rales.  O2 saturation 98  Abdominal: Soft. Bowel sounds are normal. She exhibits no distension and no mass. There is no tenderness. There is no rebound and no guarding.  Genitourinary: Vagina normal.  Musculoskeletal: Normal range of motion. She exhibits no edema and no tenderness.  Neurological: She is alert and oriented to person, place, and time. She has normal reflexes. No cranial nerve deficit. She exhibits normal muscle tone. Coordination normal.  Skin: Skin is warm and dry. No rash noted.  Feet cool and cyanotic  Psychiatric: She has a normal mood and affect. Her behavior is normal.          Assessment & Plan:   Preventive health examination Diabetes mellitus.  We'll check a hemoglobin A1c and urine for microalbumin Hypertension well controlled DOE  Will observe at this time.  We'll check laboratory screen.  This has been a chronic complaint in the past.  Patient does have trivial AS

## 2014-03-25 ENCOUNTER — Telehealth: Payer: Self-pay | Admitting: Internal Medicine

## 2014-03-25 NOTE — Telephone Encounter (Signed)
Relevant patient education mailed to patient.  

## 2014-04-07 ENCOUNTER — Telehealth: Payer: Self-pay

## 2014-04-07 NOTE — Telephone Encounter (Signed)
Relevant patient education mailed to patient.  

## 2014-05-12 ENCOUNTER — Other Ambulatory Visit: Payer: Self-pay | Admitting: Internal Medicine

## 2014-06-19 ENCOUNTER — Other Ambulatory Visit: Payer: Self-pay | Admitting: Internal Medicine

## 2014-08-27 DIAGNOSIS — Z961 Presence of intraocular lens: Secondary | ICD-10-CM | POA: Diagnosis not present

## 2014-08-27 DIAGNOSIS — E119 Type 2 diabetes mellitus without complications: Secondary | ICD-10-CM | POA: Diagnosis not present

## 2014-08-27 LAB — HM DIABETES EYE EXAM

## 2014-09-02 ENCOUNTER — Encounter: Payer: Self-pay | Admitting: Internal Medicine

## 2014-09-20 ENCOUNTER — Other Ambulatory Visit: Payer: Self-pay | Admitting: Internal Medicine

## 2014-09-24 ENCOUNTER — Other Ambulatory Visit: Payer: Self-pay | Admitting: Internal Medicine

## 2014-11-06 ENCOUNTER — Other Ambulatory Visit: Payer: Self-pay | Admitting: Internal Medicine

## 2014-11-10 ENCOUNTER — Other Ambulatory Visit: Payer: Self-pay | Admitting: Internal Medicine

## 2014-11-12 ENCOUNTER — Other Ambulatory Visit: Payer: Self-pay | Admitting: Internal Medicine

## 2014-11-12 DIAGNOSIS — Z23 Encounter for immunization: Secondary | ICD-10-CM | POA: Diagnosis not present

## 2014-11-18 ENCOUNTER — Other Ambulatory Visit: Payer: Self-pay | Admitting: Internal Medicine

## 2014-12-05 ENCOUNTER — Other Ambulatory Visit: Payer: Self-pay | Admitting: Internal Medicine

## 2014-12-20 ENCOUNTER — Encounter (HOSPITAL_COMMUNITY): Payer: Self-pay | Admitting: Emergency Medicine

## 2014-12-20 ENCOUNTER — Emergency Department (HOSPITAL_COMMUNITY)
Admission: EM | Admit: 2014-12-20 | Discharge: 2014-12-20 | Disposition: A | Discharge status: Home / Self Care | Payer: Medicare Other | Attending: Emergency Medicine | Admitting: Emergency Medicine

## 2014-12-20 ENCOUNTER — Emergency Department (HOSPITAL_COMMUNITY): Payer: Medicare Other

## 2014-12-20 DIAGNOSIS — Y998 Other external cause status: Secondary | ICD-10-CM | POA: Diagnosis not present

## 2014-12-20 DIAGNOSIS — K219 Gastro-esophageal reflux disease without esophagitis: Secondary | ICD-10-CM | POA: Insufficient documentation

## 2014-12-20 DIAGNOSIS — M1990 Unspecified osteoarthritis, unspecified site: Secondary | ICD-10-CM | POA: Insufficient documentation

## 2014-12-20 DIAGNOSIS — W01198A Fall on same level from slipping, tripping and stumbling with subsequent striking against other object, initial encounter: Secondary | ICD-10-CM | POA: Insufficient documentation

## 2014-12-20 DIAGNOSIS — Z7982 Long term (current) use of aspirin: Secondary | ICD-10-CM | POA: Insufficient documentation

## 2014-12-20 DIAGNOSIS — S0993XA Unspecified injury of face, initial encounter: Secondary | ICD-10-CM | POA: Diagnosis not present

## 2014-12-20 DIAGNOSIS — Z79899 Other long term (current) drug therapy: Secondary | ICD-10-CM | POA: Insufficient documentation

## 2014-12-20 DIAGNOSIS — S0083XA Contusion of other part of head, initial encounter: Secondary | ICD-10-CM

## 2014-12-20 DIAGNOSIS — Y92009 Unspecified place in unspecified non-institutional (private) residence as the place of occurrence of the external cause: Secondary | ICD-10-CM | POA: Insufficient documentation

## 2014-12-20 DIAGNOSIS — F329 Major depressive disorder, single episode, unspecified: Secondary | ICD-10-CM | POA: Diagnosis not present

## 2014-12-20 DIAGNOSIS — S0003XA Contusion of scalp, initial encounter: Secondary | ICD-10-CM

## 2014-12-20 DIAGNOSIS — S0101XA Laceration without foreign body of scalp, initial encounter: Secondary | ICD-10-CM | POA: Diagnosis not present

## 2014-12-20 DIAGNOSIS — Z87891 Personal history of nicotine dependence: Secondary | ICD-10-CM | POA: Diagnosis not present

## 2014-12-20 DIAGNOSIS — I10 Essential (primary) hypertension: Secondary | ICD-10-CM | POA: Insufficient documentation

## 2014-12-20 DIAGNOSIS — E119 Type 2 diabetes mellitus without complications: Secondary | ICD-10-CM | POA: Insufficient documentation

## 2014-12-20 DIAGNOSIS — Z8673 Personal history of transient ischemic attack (TIA), and cerebral infarction without residual deficits: Secondary | ICD-10-CM | POA: Diagnosis not present

## 2014-12-20 DIAGNOSIS — W19XXXA Unspecified fall, initial encounter: Secondary | ICD-10-CM

## 2014-12-20 DIAGNOSIS — Y9389 Activity, other specified: Secondary | ICD-10-CM | POA: Diagnosis not present

## 2014-12-20 DIAGNOSIS — S0990XA Unspecified injury of head, initial encounter: Secondary | ICD-10-CM | POA: Diagnosis present

## 2014-12-20 DIAGNOSIS — E785 Hyperlipidemia, unspecified: Secondary | ICD-10-CM | POA: Insufficient documentation

## 2014-12-20 NOTE — ED Notes (Signed)
Pt arrived to the ED with a complaint of a fall with a head injury.  Pt has a small laceration on the right posterior top of her head.  Pt also has a laceration on her nose which seems to have a slight deformity.  Pt states she is not on blood thinners but does take a baby Asprin daily.

## 2014-12-20 NOTE — Discharge Instructions (Signed)
Contusion °A contusion is a deep bruise. Contusions are the result of an injury that caused bleeding under the skin. The contusion may turn blue, purple, or yellow. Minor injuries will give you a painless contusion, but more severe contusions may stay painful and swollen for a few weeks.  °CAUSES  °A contusion is usually caused by a blow, trauma, or direct force to an area of the body. °SYMPTOMS  °· Swelling and redness of the injured area. °· Bruising of the injured area. °· Tenderness and soreness of the injured area. °· Pain. °DIAGNOSIS  °The diagnosis can be made by taking a history and physical exam. An X-ray, CT scan, or MRI may be needed to determine if there were any associated injuries, such as fractures. °TREATMENT  °Specific treatment will depend on what area of the body was injured. In general, the best treatment for a contusion is resting, icing, elevating, and applying cold compresses to the injured area. Over-the-counter medicines may also be recommended for pain control. Ask your caregiver what the best treatment is for your contusion. °HOME CARE INSTRUCTIONS  °· Put ice on the injured area. °¨ Put ice in a plastic bag. °¨ Place a towel between your skin and the bag. °¨ Leave the ice on for 15-20 minutes, 3-4 times a day, or as directed by your health care provider. °· Only take over-the-counter or prescription medicines for pain, discomfort, or fever as directed by your caregiver. Your caregiver may recommend avoiding anti-inflammatory medicines (aspirin, ibuprofen, and naproxen) for 48 hours because these medicines may increase bruising. °· Rest the injured area. °· If possible, elevate the injured area to reduce swelling. °SEEK IMMEDIATE MEDICAL CARE IF:  °· You have increased bruising or swelling. °· You have pain that is getting worse. °· Your swelling or pain is not relieved with medicines. °MAKE SURE YOU:  °· Understand these instructions. °· Will watch your condition. °· Will get help right  away if you are not doing well or get worse. °Document Released: 09/06/2005 Document Revised: 12/02/2013 Document Reviewed: 10/02/2011 °[employer]® Patient Information ©2015 [employer]. This information is not intended to replace advice given to you by your health care provider. Make sure you discuss any questions you have with your health care provider. ° °

## 2014-12-20 NOTE — ED Provider Notes (Signed)
CSN: [id number]     Arrival date & time 12/20/14  2050 History   First MD Initiated Contact with Patient 12/20/14 2153     Chief Complaint  Patient presents with  . Fall  . Head Injury      HPI  Patient presents after a fall at home. States she has a history of diabetes and high blood pressure. States for use as she leans over she will typically loose her balance and fall. She was on her front porch. She noticed that sitting down on the porch. She simply bent at the waist and leaned down. When she did she fell on forward. She fell over to her right side and struck her right parietal occipital scalp against a protruding brick from a flowerpot. History of her nose against the flooring on the porch. No loss of conscious. Had some bleeding from a small laceration on the nose. Had some slight bleeding from within the nose. No blood from the mouth or ears. No loss of consciousness. No confusion or repetitive speech. No difficulty with use of extremities or gait.  Past Medical History  Diagnosis Date  . Depression   . Diabetes mellitus   . GERD (gastroesophageal reflux disease)   . Hypertension   . Hyperlipidemia   . CVA (cerebral infarction)   . Aortic stenosis   . Osteoarthritis    Past Surgical History  Procedure Laterality Date  . Abdominal hysterectomy      fibroids, no cancer  . Joint replacement      left and right knee  . Rotator cuff repair    . Temporomandibular joint surgery    . Fracture surgery      left leg   Family History  Problem Relation Age of Onset  . Dementia Mother   . Hypertension Mother   . Heart attack Father   . Heart failure Sister    History  Substance Use Topics  . Smoking status: Former Smoker    Quit date: 02/07/1981  . Smokeless tobacco: Never Used  . Alcohol Use: No   OB History    No data available     Review of Systems  Constitutional: Negative for fever, chills, diaphoresis, appetite change and fatigue.  HENT: Negative for mouth  sores, sore throat and trouble swallowing.        Laceration to bridge of nose. Bleeding from the nose.  Eyes: Negative for visual disturbance.  Respiratory: Negative for cough, chest tightness, shortness of breath and wheezing.   Cardiovascular: Negative for chest pain.  Gastrointestinal: Negative for nausea, vomiting, abdominal pain, diarrhea and abdominal distention.  Endocrine: Negative for polydipsia, polyphagia and polyuria.  Genitourinary: Negative for dysuria, frequency and hematuria.  Musculoskeletal: Negative for gait problem.  Skin: Negative for color change, pallor and rash.  Neurological: Positive for headaches. Negative for dizziness, syncope and light-headedness.  Hematological: Does not bruise/bleed easily.  Psychiatric/Behavioral: Negative for behavioral problems and confusion.      Allergies  Cymbalta; Hydrochlorothiazide; Oxycodone; Ramipril; and Telmisartan-hctz  Home Medications   Prior to Admission medications   Medication Sig Start Date End Date Taking? Authorizing Provider  aspirin 325 MG tablet Take 325 mg by mouth every morning.    Yes Historical Provider, MD  Aspirin-Salicylamide-Caffeine (BC HEADACHE PO) Take 1 packet by mouth daily as needed (headache).   Yes Historical Provider, MD  Cyanocobalamin (VITAMIN B 12 PO) Take 1,000 mcg by mouth every morning.    Yes Historical Provider, MD  furosemide (LASIX) 40  MG tablet TAKE 1 TABLET BY MOUTH EVERY DAY 11/09/14  Yes Marletta Lor, MD  imipramine (TOFRANIL) 25 MG tablet TAKE 1 TABLET BY MOUTH ONCE A DAY 12/05/14  Yes Marletta Lor, MD  metFORMIN (GLUCOPHAGE) 500 MG tablet TAKE 1 TABLET TWICE A DAY WITH A MEAL 09/23/14  Yes Marletta Lor, MD  Multiple Vitamin (MULTIVITAMIN) tablet Take 1 tablet by mouth daily.     Yes Historical Provider, MD  NEXIUM 40 MG capsule TAKE ONE CAPSULE BY MOUTH EVERY DAY 09/23/14  Yes Marletta Lor, MD  potassium chloride SA (K-DUR,KLOR-CON) 20 MEQ tablet Take  20 mEq by mouth at bedtime.   Yes Historical Provider, MD  simvastatin (ZOCOR) 20 MG tablet TAKE 1 TABLET BY MOUTH AT BEDTIME 01/09/14  Yes Marletta Lor, MD  verapamil (VERELAN PM) 360 MG 24 hr capsule TAKE ONE CAPSULE BY MOUTH AT BEDTIME 11/10/14  Yes Marletta Lor, MD  furosemide (LASIX) 40 MG tablet TAKE 1 TABLET BY MOUTH EVERY DAY Patient not taking: Reported on 12/20/2014 11/19/14   Marletta Lor, MD  glucose blood (FREESTYLE TEST STRIPS) test strip 1 each by Other route daily. Use as instructed 03/14/13   Marletta Lor, MD  KLOR-CON M20 20 MEQ tablet 1 TABLET DAILY Patient not taking: Reported on 12/20/2014    Marletta Lor, MD  NEXIUM 40 MG capsule TAKE ONE CAPSULE BY MOUTH EVERY DAY Patient not taking: Reported on 12/20/2014 09/25/14   Marletta Lor, MD  zolpidem (AMBIEN) 5 MG tablet Take 1 tablet (5 mg total) by mouth at bedtime as needed for sleep. 08/05/13   Marletta Lor, MD   BP 136/46 mmHg  Pulse 90  Temp(Src) 97.9 F (36.6 C) (Oral)  Resp 17  Ht 5\' 4"  (1.626 m)  Wt 180 lb (81.647 kg)  BMI 30.88 kg/m2  SpO2 99% Physical Exam  Constitutional: She is oriented to person, place, and time. She appears well-developed and well-nourished. No distress.  HENT:  Head: Normocephalic.    Nose:    Mouth/Throat:    Area of contusion soft tissue swelling on right temporoparietal scalp. No blood over the TMs mastoids or from ears nose or mouth.no blood from ears.  Eyes: Conjunctivae are normal. Pupils are equal, round, and reactive to light. No scleral icterus.  Neck: Normal range of motion. Neck supple. No thyromegaly present.  Cardiovascular: Normal rate and regular rhythm.  Exam reveals no gallop and no friction rub.   No murmur heard. Pulmonary/Chest: Effort normal and breath sounds normal. No respiratory distress. She has no wheezes. She has no rales.  Abdominal: Soft. Bowel sounds are normal. She exhibits no distension. There is no  tenderness. There is no rebound.  Musculoskeletal: Normal range of motion.  Neurological: She is alert and oriented to person, place, and time.  Normal neurological exam. Awake alert. Normal cranial nerves without deficit. Normal strength and sensation and gait.  Skin: Skin is warm and dry. No rash noted.  Psychiatric: She has a normal mood and affect. Her behavior is normal.    ED Course  Procedures (including critical care time) Labs Review Labs Reviewed - No data to display  Imaging Review Ct Head Wo Contrast  12/20/2014   CLINICAL DATA:  Bent over and lost balance, fell, scalp laceration.  EXAM: CT HEAD WITHOUT CONTRAST  CT MAXILLOFACIAL WITHOUT CONTRAST  TECHNIQUE: Multidetector CT imaging of the head and maxillofacial structures were performed using the standard protocol without intravenous contrast.  Multiplanar CT image reconstructions of the maxillofacial structures were also generated.  COMPARISON:  CT of the head Apr 18, 2011  FINDINGS: CT HEAD FINDINGS  The ventricles and sulci are normal for age. No intraparenchymal hemorrhage, mass effect nor midline shift. Confluent supratentorial white matter hypodensities are similar. No acute large vascular territory infarcts. Remote LEFT putaminal, RIGHT caudate head lacunar infarcts.  No abnormal extra-axial fluid collections. Basal cisterns are patent. Mild calcific atherosclerosis of the carotid siphons.  No skull fracture. Moderate RIGHT frontoparietal scalp hematoma without subcutaneous gas or radiopaque foreign bodies.  CT MAXILLOFACIAL FINDINGS  Mandible zone intact, severe LEFT temporomandibular osteoarthrosis with mildly anteriorly subluxed condyle, likely chronic. No acute facial fracture.  Nasal septum is midline. Paranasal sinuses are well aerated. No destructive bony lesions.  Mild RIGHT nasal ala soft tissue swelling without subcutaneous gas or radiopaque foreign bodies. Status post bilateral ocular lens implants, ocular globes and  orbital contents are nonacute in appearance. Mild calcific atherosclerosis of the internal carotid arteries.  IMPRESSION: CT HEAD: Moderate RIGHT frontal parietal scalp hematoma. No skull fracture.  No acute intracranial process.  Similar involutional changes. Severe white matter changes can be seen with chronic small vessel ischemic disease, remote bilateral basal ganglia lacunar infarct.  CT MAXILLOFACIAL: RIGHT nasal soft tissue swelling without acute facial fracture.   Electronically Signed   By: Elon Alas   On: 12/20/2014 22:58   Ct Maxillofacial Wo Cm  12/20/2014   CLINICAL DATA:  Bent over and lost balance, fell, scalp laceration.  EXAM: CT HEAD WITHOUT CONTRAST  CT MAXILLOFACIAL WITHOUT CONTRAST  TECHNIQUE: Multidetector CT imaging of the head and maxillofacial structures were performed using the standard protocol without intravenous contrast. Multiplanar CT image reconstructions of the maxillofacial structures were also generated.  COMPARISON:  CT of the head Apr 18, 2011  FINDINGS: CT HEAD FINDINGS  The ventricles and sulci are normal for age. No intraparenchymal hemorrhage, mass effect nor midline shift. Confluent supratentorial white matter hypodensities are similar. No acute large vascular territory infarcts. Remote LEFT putaminal, RIGHT caudate head lacunar infarcts.  No abnormal extra-axial fluid collections. Basal cisterns are patent. Mild calcific atherosclerosis of the carotid siphons.  No skull fracture. Moderate RIGHT frontoparietal scalp hematoma without subcutaneous gas or radiopaque foreign bodies.  CT MAXILLOFACIAL FINDINGS  Mandible zone intact, severe LEFT temporomandibular osteoarthrosis with mildly anteriorly subluxed condyle, likely chronic. No acute facial fracture.  Nasal septum is midline. Paranasal sinuses are well aerated. No destructive bony lesions.  Mild RIGHT nasal ala soft tissue swelling without subcutaneous gas or radiopaque foreign bodies. Status post bilateral  ocular lens implants, ocular globes and orbital contents are nonacute in appearance. Mild calcific atherosclerosis of the internal carotid arteries.  IMPRESSION: CT HEAD: Moderate RIGHT frontal parietal scalp hematoma. No skull fracture.  No acute intracranial process.  Similar involutional changes. Severe white matter changes can be seen with chronic small vessel ischemic disease, remote bilateral basal ganglia lacunar infarct.  CT MAXILLOFACIAL: RIGHT nasal soft tissue swelling without acute facial fracture.   Electronically Signed   By: Elon Alas   On: 12/20/2014 22:58     EKG Interpretation None      MDM   Final diagnoses:  Fall  Facial contusion, initial encounter  Scalp contusion, initial encounter    CT is pending. Awake alert. No concussive symptoms. Clinically does not have nasal or facial fracture.    Tanna Furry, MD 12/20/14 2330

## 2015-01-04 ENCOUNTER — Other Ambulatory Visit: Payer: Self-pay | Admitting: Internal Medicine

## 2015-01-05 ENCOUNTER — Telehealth: Payer: Self-pay | Admitting: Internal Medicine

## 2015-01-05 MED ORDER — SIMVASTATIN 20 MG PO TABS: 20.0000 mg | ORAL_TABLET | Freq: Every day | ORAL | Status: DC

## 2015-01-05 NOTE — Telephone Encounter (Signed)
Rx sent to pharmacy   

## 2015-01-05 NOTE — Telephone Encounter (Signed)
Pt would like to change her pharm to [facility]/ [address]  Can you resend rx simvastatin (ZOCOR) 20 MG tablet to [facility]?

## 2015-03-11 ENCOUNTER — Telehealth: Payer: Self-pay | Admitting: Internal Medicine

## 2015-03-11 NOTE — Telephone Encounter (Signed)
Pt having new grandchild and advised to get TDAP.  I see that pt has had 2013.  Can you confirm this w/ pt? Also wants to get sugar a1c. Pt needs CPE.   Can she gets this checked or ok to wait until he returns. Pt is Not having any issues, just knows it is time to see the dr.

## 2015-03-11 NOTE — Telephone Encounter (Signed)
No she should really be seen for follow up for diabetes, if she does not want to come in before then just schedule physical for May.

## 2015-03-11 NOTE — Telephone Encounter (Signed)
Do you want me to schedule in his few appts that are left. Or see if she wants to wait?

## 2015-03-11 NOTE — Telephone Encounter (Signed)
[name], pt had Tdap in 2013 so she is fine and physical is not due till after April 14th so she will have to wait till Dr.K comes back for physical, but you can bring her in for a follow up visit due to not been seen since 03/2014 and he can check lab then.

## 2015-03-12 NOTE — Telephone Encounter (Signed)
Pt's main concern was the tdap. Pt states she will wait until may for her cpe and will come in fasting.  Prefers late day appt.

## 2015-03-22 ENCOUNTER — Emergency Department (HOSPITAL_COMMUNITY)
Admission: EM | Admit: 2015-03-22 | Discharge: 2015-03-23 | Disposition: A | Discharge status: Home / Self Care | Payer: Medicare Other | Attending: Emergency Medicine | Admitting: Emergency Medicine

## 2015-03-22 ENCOUNTER — Emergency Department (HOSPITAL_COMMUNITY): Payer: Medicare Other

## 2015-03-22 ENCOUNTER — Encounter (HOSPITAL_COMMUNITY): Payer: Self-pay | Admitting: *Deleted

## 2015-03-22 DIAGNOSIS — E119 Type 2 diabetes mellitus without complications: Secondary | ICD-10-CM | POA: Diagnosis not present

## 2015-03-22 DIAGNOSIS — S0990XA Unspecified injury of head, initial encounter: Secondary | ICD-10-CM | POA: Diagnosis not present

## 2015-03-22 DIAGNOSIS — Z792 Long term (current) use of antibiotics: Secondary | ICD-10-CM | POA: Insufficient documentation

## 2015-03-22 DIAGNOSIS — Y998 Other external cause status: Secondary | ICD-10-CM | POA: Insufficient documentation

## 2015-03-22 DIAGNOSIS — F329 Major depressive disorder, single episode, unspecified: Secondary | ICD-10-CM | POA: Diagnosis not present

## 2015-03-22 DIAGNOSIS — Y9389 Activity, other specified: Secondary | ICD-10-CM | POA: Insufficient documentation

## 2015-03-22 DIAGNOSIS — S022XXA Fracture of nasal bones, initial encounter for closed fracture: Secondary | ICD-10-CM | POA: Diagnosis not present

## 2015-03-22 DIAGNOSIS — S0292XA Unspecified fracture of facial bones, initial encounter for closed fracture: Secondary | ICD-10-CM | POA: Diagnosis not present

## 2015-03-22 DIAGNOSIS — Z8673 Personal history of transient ischemic attack (TIA), and cerebral infarction without residual deficits: Secondary | ICD-10-CM | POA: Diagnosis not present

## 2015-03-22 DIAGNOSIS — Z79899 Other long term (current) drug therapy: Secondary | ICD-10-CM | POA: Insufficient documentation

## 2015-03-22 DIAGNOSIS — E785 Hyperlipidemia, unspecified: Secondary | ICD-10-CM | POA: Diagnosis not present

## 2015-03-22 DIAGNOSIS — Z87891 Personal history of nicotine dependence: Secondary | ICD-10-CM | POA: Insufficient documentation

## 2015-03-22 DIAGNOSIS — Z96652 Presence of left artificial knee joint: Secondary | ICD-10-CM | POA: Diagnosis not present

## 2015-03-22 DIAGNOSIS — Y9289 Other specified places as the place of occurrence of the external cause: Secondary | ICD-10-CM | POA: Insufficient documentation

## 2015-03-22 DIAGNOSIS — W01198A Fall on same level from slipping, tripping and stumbling with subsequent striking against other object, initial encounter: Secondary | ICD-10-CM | POA: Insufficient documentation

## 2015-03-22 DIAGNOSIS — S02401A Maxillary fracture, unspecified, initial encounter for closed fracture: Secondary | ICD-10-CM

## 2015-03-22 DIAGNOSIS — S5002XA Contusion of left elbow, initial encounter: Secondary | ICD-10-CM | POA: Diagnosis not present

## 2015-03-22 DIAGNOSIS — Z7982 Long term (current) use of aspirin: Secondary | ICD-10-CM | POA: Diagnosis not present

## 2015-03-22 DIAGNOSIS — M1990 Unspecified osteoarthritis, unspecified site: Secondary | ICD-10-CM | POA: Diagnosis not present

## 2015-03-22 DIAGNOSIS — S01512A Laceration without foreign body of oral cavity, initial encounter: Secondary | ICD-10-CM | POA: Diagnosis not present

## 2015-03-22 DIAGNOSIS — M25562 Pain in left knee: Secondary | ICD-10-CM | POA: Diagnosis not present

## 2015-03-22 DIAGNOSIS — W19XXXA Unspecified fall, initial encounter: Secondary | ICD-10-CM

## 2015-03-22 DIAGNOSIS — I10 Essential (primary) hypertension: Secondary | ICD-10-CM | POA: Diagnosis not present

## 2015-03-22 DIAGNOSIS — S0181XA Laceration without foreign body of other part of head, initial encounter: Secondary | ICD-10-CM | POA: Diagnosis not present

## 2015-03-22 DIAGNOSIS — S8992XA Unspecified injury of left lower leg, initial encounter: Secondary | ICD-10-CM | POA: Diagnosis not present

## 2015-03-22 DIAGNOSIS — S0993XA Unspecified injury of face, initial encounter: Secondary | ICD-10-CM | POA: Diagnosis present

## 2015-03-22 DIAGNOSIS — R42 Dizziness and giddiness: Secondary | ICD-10-CM | POA: Diagnosis not present

## 2015-03-22 DIAGNOSIS — R404 Transient alteration of awareness: Secondary | ICD-10-CM | POA: Diagnosis not present

## 2015-03-22 DIAGNOSIS — R000 Tachycardia, unspecified: Secondary | ICD-10-CM | POA: Diagnosis not present

## 2015-03-22 DIAGNOSIS — S80212A Abrasion, left knee, initial encounter: Secondary | ICD-10-CM | POA: Diagnosis not present

## 2015-03-22 LAB — URINALYSIS, ROUTINE W REFLEX MICROSCOPIC
BILIRUBIN URINE: NEGATIVE
GLUCOSE, UA: 500 mg/dL — AB
Hgb urine dipstick: NEGATIVE
KETONES UR: NEGATIVE mg/dL
Leukocytes, UA: NEGATIVE
Nitrite: NEGATIVE
PH: 5 (ref 5.0–8.0)
Protein, ur: NEGATIVE mg/dL
Specific Gravity, Urine: 1.017 (ref 1.005–1.030)
Urobilinogen, UA: 0.2 mg/dL (ref 0.0–1.0)

## 2015-03-22 LAB — CBC WITH DIFFERENTIAL/PLATELET
Basophils Absolute: 0 10*3/uL (ref 0.0–0.1)
Basophils Relative: 0 % (ref 0–1)
EOS ABS: 0 10*3/uL (ref 0.0–0.7)
EOS PCT: 0 % (ref 0–5)
HCT: 42.4 % (ref 36.0–46.0)
Hemoglobin: 13.7 g/dL (ref 12.0–15.0)
LYMPHS ABS: 1 10*3/uL (ref 0.7–4.0)
Lymphocytes Relative: 10 % — ABNORMAL LOW (ref 12–46)
MCH: 28.7 pg (ref 26.0–34.0)
MCHC: 32.3 g/dL (ref 30.0–36.0)
MCV: 88.9 fL (ref 78.0–100.0)
MONO ABS: 0.7 10*3/uL (ref 0.1–1.0)
MONOS PCT: 7 % (ref 3–12)
NEUTROS PCT: 83 % — AB (ref 43–77)
Neutro Abs: 8.9 10*3/uL — ABNORMAL HIGH (ref 1.7–7.7)
Platelets: 240 10*3/uL (ref 150–400)
RBC: 4.77 MIL/uL (ref 3.87–5.11)
RDW: 12.9 % (ref 11.5–15.5)
WBC: 10.6 10*3/uL — ABNORMAL HIGH (ref 4.0–10.5)

## 2015-03-22 LAB — COMPREHENSIVE METABOLIC PANEL
ALBUMIN: 4.3 g/dL (ref 3.5–5.2)
ALK PHOS: 68 U/L (ref 39–117)
ALT: 18 U/L (ref 0–35)
AST: 24 U/L (ref 0–37)
Anion gap: 9 (ref 5–15)
BILIRUBIN TOTAL: 0.5 mg/dL (ref 0.3–1.2)
BUN: 20 mg/dL (ref 6–23)
CHLORIDE: 103 mmol/L (ref 96–112)
CO2: 28 mmol/L (ref 19–32)
CREATININE: 1.3 mg/dL — AB (ref 0.50–1.10)
Calcium: 9.6 mg/dL (ref 8.4–10.5)
GFR, EST AFRICAN AMERICAN: 44 mL/min — AB (ref >90)
GFR, EST NON AFRICAN AMERICAN: 38 mL/min — AB (ref >90)
Glucose, Bld: 232 mg/dL — ABNORMAL HIGH (ref 70–99)
Potassium: 3.6 mmol/L (ref 3.5–5.1)
SODIUM: 140 mmol/L (ref 135–145)
Total Protein: 7.6 g/dL (ref 6.0–8.3)

## 2015-03-22 LAB — TROPONIN I

## 2015-03-22 LAB — CBG MONITORING, ED: GLUCOSE-CAPILLARY: 191 mg/dL — AB (ref 70–99)

## 2015-03-22 NOTE — ED Notes (Signed)
Pt states fell on the concrete floor in garage, states felt dizzy and lightheaded when fell, denies LOC, states L elbow, L knee and face hurt, hematoma/bruising noted to L elbow, small bruising to L knee and small laceration to top lip, abrasion to forehead, bleeding controlled, pt a/o x 4.

## 2015-03-22 NOTE — ED Notes (Signed)
Per EMS pt from home, pt was walking out to garage, 2-3 steps, started to feel lightheaded and dizzy, fell onto concrete in garage, hitting face, was laying face down when EMS arrived, complaining of nasal pain. Pt a/o x 4, no neck or back pain, denies LOC.

## 2015-03-22 NOTE — ED Provider Notes (Signed)
CSN: [id number]     Arrival date & time 03/22/15  2003 History   First MD Initiated Contact with Patient 03/22/15 2149     Chief Complaint  Patient presents with  . Fall     (Consider location/radiation/quality/duration/timing/severity/associated sxs/prior Treatment) HPI Comments: Patient presents to the ER for evaluation after a fall. Patient reports that she got out of her car and started to walk across her garage when she became dizzy. The dizziness caused her to fall. She fell forward and hit her face. There was no loss of consciousness. Patient denies chest pain and shortness of breath. She is complaining of laceration of lip and bruising and swelling of her nose. She also has a bruise on her left elbow and left knee that are mildly painful.  Patient is a 81 y.o. female presenting with fall.  Fall    Past Medical History  Diagnosis Date  . Depression   . Diabetes mellitus   . GERD (gastroesophageal reflux disease)   . Hypertension   . Hyperlipidemia   . CVA (cerebral infarction)   . Aortic stenosis   . Osteoarthritis    Past Surgical History  Procedure Laterality Date  . Abdominal hysterectomy      fibroids, no cancer  . Joint replacement      left and right knee  . Rotator cuff repair    . Temporomandibular joint surgery    . Fracture surgery      left leg   Family History  Problem Relation Age of Onset  . Dementia Mother   . Hypertension Mother   . Heart attack Father   . Heart failure Sister    History  Substance Use Topics  . Smoking status: Former Smoker    Quit date: 02/07/1981  . Smokeless tobacco: Never Used  . Alcohol Use: No   OB History    No data available     Review of Systems  Musculoskeletal: Positive for arthralgias. Negative for back pain and neck pain.  Skin: Positive for wound.  All other systems reviewed and are negative.     Allergies  Cymbalta; Hydrochlorothiazide; Oxycodone; Ramipril; and Telmisartan-hctz  Home  Medications   Prior to Admission medications   Medication Sig Start Date End Date Taking? Authorizing Provider  aspirin 325 MG tablet Take 325 mg by mouth every morning.    Yes Historical Provider, MD  Aspirin-Salicylamide-Caffeine (BC HEADACHE PO) Take 1 packet by mouth daily as needed (headache).   Yes Historical Provider, MD  Cyanocobalamin (VITAMIN B 12 PO) Take 1,000 mcg by mouth every morning.    Yes Historical Provider, MD  furosemide (LASIX) 40 MG tablet TAKE 1 TABLET BY MOUTH EVERY DAY 11/09/14  Yes Marletta Lor, MD  ibuprofen (ADVIL,MOTRIN) 200 MG tablet Take 400 mg by mouth every 6 (six) hours as needed for moderate pain (pain).   Yes Historical Provider, MD  imipramine (TOFRANIL) 25 MG tablet TAKE 1 TABLET BY MOUTH ONCE A DAY 12/05/14  Yes Marletta Lor, MD  KLOR-CON M20 20 MEQ tablet 1 TABLET DAILY Patient taking differently: Take 1 tabley by mouth daily   Yes Marletta Lor, MD  metFORMIN (GLUCOPHAGE) 500 MG tablet TAKE 1 TABLET TWICE A DAY WITH A MEAL 09/23/14  Yes Marletta Lor, MD  Multiple Vitamin (MULTIVITAMIN) tablet Take 1 tablet by mouth daily.     Yes Historical Provider, MD  NEXIUM 40 MG capsule TAKE ONE CAPSULE BY MOUTH EVERY DAY 09/23/14  Yes Doretha Sou  Burnice Logan, MD  simvastatin (ZOCOR) 20 MG tablet Take 1 tablet (20 mg total) by mouth at bedtime. 01/05/15  Yes Marletta Lor, MD  verapamil (VERELAN PM) 360 MG 24 hr capsule TAKE ONE CAPSULE BY MOUTH AT BEDTIME 11/10/14  Yes Marletta Lor, MD  amoxicillin-clavulanate (AUGMENTIN) 875-125 MG per tablet Take 1 tablet by mouth 2 (two) times daily. 03/23/15   Orpah Greek, MD  furosemide (LASIX) 40 MG tablet TAKE 1 TABLET BY MOUTH EVERY DAY Patient not taking: Reported on 12/20/2014 11/19/14   Marletta Lor, MD  glucose blood (FREESTYLE TEST STRIPS) test strip 1 each by Other route daily. Use as instructed 03/14/13   Marletta Lor, MD  NEXIUM 40 MG capsule TAKE ONE CAPSULE BY  MOUTH EVERY DAY Patient not taking: Reported on 12/20/2014 09/25/14   Marletta Lor, MD  traMADol (ULTRAM) 50 MG tablet Take 0.5-1 tablets (25-50 mg total) by mouth every 6 (six) hours as needed. 03/23/15   Orpah Greek, MD  zolpidem (AMBIEN) 5 MG tablet Take 1 tablet (5 mg total) by mouth at bedtime as needed for sleep. Patient not taking: Reported on 03/22/2015 08/05/13   Marletta Lor, MD   BP 147/71 mmHg  Pulse 96  Temp(Src) 97.5 F (36.4 C) (Oral)  Resp 22  SpO2 100% Physical Exam  Constitutional: She is oriented to person, place, and time. She appears well-developed and well-nourished. No distress.  HENT:  Head: Normocephalic. Head is with contusion and with laceration.    Right Ear: Hearing normal.  Left Ear: Hearing normal.  Nose: Sinus tenderness present. No nasal septal hematoma.  Mouth/Throat: Oropharynx is clear and moist and mucous membranes are normal.  Eyes: Conjunctivae and EOM are normal. Pupils are equal, round, and reactive to light.  Neck: Normal range of motion. Neck supple. No spinous process tenderness and no muscular tenderness present.  Cardiovascular: Regular rhythm, S1 normal and S2 normal.  Exam reveals no gallop and no friction rub.   No murmur heard. Pulmonary/Chest: Effort normal and breath sounds normal. No respiratory distress. She exhibits no tenderness.  Abdominal: Soft. Normal appearance and bowel sounds are normal. There is no hepatosplenomegaly. There is no tenderness. There is no rebound, no guarding, no tenderness at McBurney's point and negative Murphy's sign. No hernia.  Musculoskeletal: Normal range of motion.  Neurological: She is alert and oriented to person, place, and time. She has normal strength. No cranial nerve deficit or sensory deficit. Coordination normal. GCS eye subscore is 4. GCS verbal subscore is 5. GCS motor subscore is 6.  Skin: Skin is warm and dry. Abrasion (left knee), bruising (left elbow) and laceration  (forehead and upper lip) noted. No rash noted. No cyanosis.     Psychiatric: She has a normal mood and affect. Her speech is normal and behavior is normal. Thought content normal.  Nursing note and vitals reviewed.   ED Course  Procedures (including critical care time) Labs Review Labs Reviewed  CBC WITH DIFFERENTIAL/PLATELET - Abnormal; Notable for the following:    WBC 10.6 (*)    Neutrophils Relative % 83 (*)    Neutro Abs 8.9 (*)    Lymphocytes Relative 10 (*)    All other components within normal limits  COMPREHENSIVE METABOLIC PANEL - Abnormal; Notable for the following:    Glucose, Bld 232 (*)    Creatinine, Ser 1.30 (*)    GFR calc non Af Amer 38 (*)    GFR calc Af Amer 44 (*)  All other components within normal limits  URINALYSIS, ROUTINE W REFLEX MICROSCOPIC - Abnormal; Notable for the following:    APPearance CLOUDY (*)    Glucose, UA 500 (*)    All other components within normal limits  CBG MONITORING, ED - Abnormal; Notable for the following:    Glucose-Capillary 191 (*)    All other components within normal limits  TROPONIN I  CBG MONITORING, ED    Imaging Review Dg Elbow Complete Left  03/22/2015   CLINICAL DATA:  Dizziness while walking in garage, fell on concrete this afternoon. Knee and elbow pain. Large LEFT elbow hematoma.  EXAM: LEFT ELBOW - COMPLETE 3+ VIEW  COMPARISON:  None.  FINDINGS: There is no evidence of fracture, dislocation, or joint effusion. Enthesopathy of lateral epicondyle. There is no evidence of arthropathy or other focal bone abnormality. Olecranon soft tissue swelling without subcutaneous gas or radiopaque foreign bodies.  IMPRESSION: Olecranon soft tissue swelling without acute osseous process.   Electronically Signed   By: Elon Alas   On: 03/22/2015 22:52   Ct Head Wo Contrast  03/22/2015   CLINICAL DATA:  Fall onto concrete hitting face after feeling lightheaded and dizzy in garage. Patient reports remote CVA.  EXAM: CT HEAD  WITHOUT CONTRAST  CT MAXILLOFACIAL WITHOUT CONTRAST  TECHNIQUE: Multidetector CT imaging of the head and maxillofacial structures were performed using the standard protocol without intravenous contrast. Multiplanar CT image reconstructions of the maxillofacial structures were also generated.  COMPARISON:  12/20/2014  FINDINGS: CT HEAD FINDINGS  No intracranial hemorrhage, mass effect, or midline shift. No hydrocephalus. The basilar cisterns are patent. Atrophy and chronic small vessel ischemic change, stable from prior exam. Remote lacunar infarcts in the right caudate and left basal ganglia, unchanged. No intracranial fluid collection. Small right frontal scalp hematoma. Calvarium is intact. The mastoid air cells are well aerated.  CT MAXILLOFACIAL FINDINGS  Multiple facial bone fractures, all nondisplaced. On the right there is a fracture of the right anterior and lateral maxillary sinus with associated surrounding soft tissue air and hemorrhage in the right maxillary sinus. There is no extension into the right orbit. On the left fracture of the anterior medial and medial maxillary sinus with associated hemorrhage in the maxillary sinuses and associated soft tissue emphysema. There is no extension to the left orbit. There is a comminuted nasal bone fracture. There is no mandible fracture. Chronic arthritic change of the left temporomandibular joint with osseous remottling. Both zygomatic arches and pterygoid plates are intact.  IMPRESSION: 1. No acute intracranial abnormality.  No calvarial fracture. 2. Multiple nondisplaced facial bone fractures, involving the right and left maxillary sinus, and associated hemorrhage in the sinus cavities. Comminuted nasal bone fracture.   Electronically Signed   By: Jeb Levering M.D.   On: 03/22/2015 23:10   Dg Knee Complete 4 Views Left  03/22/2015   CLINICAL DATA:  Dizziness while walking garage, fell on concrete this afternoon. Knee and elbow pain. Large LEFT elbow  hematoma.  EXAM: LEFT KNEE - COMPLETE 4+ VIEW  COMPARISON:  LEFT knee radiograph January 04, 2004  FINDINGS: No acute fracture deformity. No dislocation. Interval total knee arthroplasty with intact well seated femoral and tibial cemented components, no periprosthetic lucency. Expected appearance of the postoperative patella. No destructive bony lesions. No subcutaneous gas or radiopaque foreign bodies.  IMPRESSION: No acute fracture deformity or dislocation.  Interval total knee arthroplasty with expected postoperative change.   Electronically Signed   By: Elon Alas  On: 03/22/2015 22:47   Ct Maxillofacial Wo Cm  03/22/2015   CLINICAL DATA:  Fall onto concrete hitting face after feeling lightheaded and dizzy in garage. Patient reports remote CVA.  EXAM: CT HEAD WITHOUT CONTRAST  CT MAXILLOFACIAL WITHOUT CONTRAST  TECHNIQUE: Multidetector CT imaging of the head and maxillofacial structures were performed using the standard protocol without intravenous contrast. Multiplanar CT image reconstructions of the maxillofacial structures were also generated.  COMPARISON:  12/20/2014  FINDINGS: CT HEAD FINDINGS  No intracranial hemorrhage, mass effect, or midline shift. No hydrocephalus. The basilar cisterns are patent. Atrophy and chronic small vessel ischemic change, stable from prior exam. Remote lacunar infarcts in the right caudate and left basal ganglia, unchanged. No intracranial fluid collection. Small right frontal scalp hematoma. Calvarium is intact. The mastoid air cells are well aerated.  CT MAXILLOFACIAL FINDINGS  Multiple facial bone fractures, all nondisplaced. On the right there is a fracture of the right anterior and lateral maxillary sinus with associated surrounding soft tissue air and hemorrhage in the right maxillary sinus. There is no extension into the right orbit. On the left fracture of the anterior medial and medial maxillary sinus with associated hemorrhage in the maxillary sinuses and  associated soft tissue emphysema. There is no extension to the left orbit. There is a comminuted nasal bone fracture. There is no mandible fracture. Chronic arthritic change of the left temporomandibular joint with osseous remottling. Both zygomatic arches and pterygoid plates are intact.  IMPRESSION: 1. No acute intracranial abnormality.  No calvarial fracture. 2. Multiple nondisplaced facial bone fractures, involving the right and left maxillary sinus, and associated hemorrhage in the sinus cavities. Comminuted nasal bone fracture.   Electronically Signed   By: Jeb Levering M.D.   On: 03/22/2015 23:10     EKG Interpretation   Date/Time:  Monday March 22 2015 20:21:57 EDT Ventricular Rate:  100 PR Interval:  140 QRS Duration: 89 QT Interval:  363 QTC Calculation: 468 R Axis:   61 Text Interpretation:  Sinus tachycardia Otherwise within normal limits  Confirmed by POLLINA  MD, CHRISTOPHER [phone]) on 03/22/2015 8:54:48 PM      MDM   Final diagnoses:  Fall  Nasal bone fractures, closed, initial encounter  Maxillary sinus fracture, closed, initial encounter  Laceration of mouth, internal, initial encounter   Patient presents to the ER for evaluation of mechanical fall. Patient reports that she got out of her car took several steps and became acutely dizzy. This causes her to fall forward and hit her face on the concrete. There was no loss of consciousness. Patient complaining of facial pain, but no other significant complaints, other than bruises of the left elbow and knee. X-rays of the knee and elbow were negative. CT scan of maxillofacial bones reveals nasal bone and multiple sinus fractures, all nondisplaced. No acute intervention necessary. Head CT did not show any evidence of bleeding or intracranial abnormality.  The option of admission, but she wishes to go home. I believe that she is safe for discharge, will give her Ultram as needed for pain, Augmentin to be used for  prophylactic treatment of the sinus fractures as well as the oral laceration. She had a superficial linear laceration on her forehead that was covered with Steri-Strips. She had a more significant intraoral laceration of upper lip on the inside mucosa, but I did not wish to put sutures in this area because of increased risk of infection. She was counseled to rinse the area thoroughly with peroxide  and water after eating, will follow-up with Dr. Simeon Craft, ENT, who is on for facial trauma tonight.    Orpah Greek, MD 03/23/15 [phone]

## 2015-03-23 MED ORDER — TRAMADOL HCL 50 MG PO TABS: 25.0000 mg | ORAL_TABLET | Freq: Four times a day (QID) | ORAL | Status: DC | PRN

## 2015-03-23 MED ORDER — AMOXICILLIN-POT CLAVULANATE 875-125 MG PO TABS: 1.0000 | ORAL_TABLET | Freq: Two times a day (BID) | ORAL | Status: DC

## 2015-03-23 NOTE — Discharge Instructions (Signed)
Facial Fracture A facial fracture is a break in one of the bones of your face. HOME CARE INSTRUCTIONS   Protect the injured part of your face until it is healed.  Do not participate in activities which give chance for re-injury until your doctor approves.  Gently wash and dry your face.  Wear head and facial protection while riding a bicycle, motorcycle, or snowmobile. SEEK MEDICAL CARE IF:   An oral temperature above 102 F (38.9 C) develops.  You have severe headaches or notice changes in your vision.  You have new numbness or tingling in your face.  You develop nausea (feeling sick to your stomach), vomiting or a stiff neck. SEEK IMMEDIATE MEDICAL CARE IF:   You develop difficulty seeing or experience double vision.  You become dizzy, lightheaded, or faint.  You develop trouble speaking, breathing, or swallowing.  You have a watery discharge from your nose or ear. MAKE SURE YOU:   Understand these instructions.  Will watch your condition.  Will get help right away if you are not doing well or get worse. Document Released: 11/27/2005 Document Revised: 02/19/2012 Document Reviewed: 07/16/2008 [facility] Patient Information 2015 [place], [place]. This information is not intended to replace advice given to you by your health care provider. Make sure you discuss any questions you have with your health care provider.  Nasal Fracture A nasal fracture is a break or crack in the bones of the nose. A minor break usually heals in a month. You often will receive black eyes from a nasal fracture. This is not a cause for concern. The black eyes will go away over 1 to 2 weeks.  DIAGNOSIS  Your caregiver may want to examine you if you are concerned about a fracture of the nose. X-rays of the nose may not show a nasal fracture even when one is present. Sometimes your caregiver must wait 1 to 5 days after the injury to re-check the nose for alignment and to take additional X-rays. Sometimes  the caregiver must wait until the swelling has gone down. TREATMENT Minor fractures that have caused no deformity often do not require treatment. More serious fractures where bones are displaced may require surgery. This will take place after the swelling is gone. Surgery will stabilize and align the fracture. HOME CARE INSTRUCTIONS   Put ice on the injured area.  Put ice in a plastic bag.  Place a towel between your skin and the bag.  Leave the ice on for 15-20 minutes, 03-04 times a day.  Take medications as directed by your caregiver.  Only take over-the-counter or prescription medicines for pain, discomfort, or fever as directed by your caregiver.  If your nose starts bleeding, squeeze the soft parts of the nose against the center wall while you are sitting in an upright position for 10 minutes.  Contact sports should be avoided for at least 3 to 4 weeks or as directed by your caregiver. SEEK MEDICAL CARE IF:  Your pain increases or becomes severe.  You continue to have nosebleeds.  The shape of your nose does not return to normal within 5 days.  You have pus draining from the nose. SEEK IMMEDIATE MEDICAL CARE IF:   You have bleeding from your nose that does not stop after 20 minutes of pinching the nostrils closed and keeping ice on the nose.  You have clear fluid draining from your nose.  You notice a grape-like swelling on the dividing wall between the nostrils (septum). This is a  collection of blood (hematoma) that must be drained to help prevent infection.  You have difficulty moving your eyes.  You have recurrent vomiting. Document Released: 11/24/2000 Document Revised: 02/19/2012 Document Reviewed: 03/13/2011 [facility] Patient Information 2015 [place], [place]. This information is not intended to replace advice given to you by your health care provider. Make sure you discuss any questions you have with your health care provider.  Mouth Laceration A mouth  laceration is a cut inside the mouth. TREATMENT  Because of all the bacteria in the mouth, lacerations are usually not stitched (sutured) unless the wound is gaping open. Sometimes, a couple sutures may be placed just to hold the edges of the wound together and to speed healing. Over the next 1 to 2 days, you will see that the wound edges appear gray in color. The edges may appear ragged and slightly spread apart. Because of all the normal bacteria in the mouth, these wounds are contaminated, but this is not an infection that needs antibiotics. Most wounds heal with no problems despite their appearance. HOME CARE INSTRUCTIONS   Rinse your mouth with a warm, saltwater wash 4 to 6 times per day, or as your caregiver instructs.  Continue oral hygiene and gentle tooth brushing as normal, if possible.  Do not eat or drink hot food or beverages while your mouth is still numb.  Eat a bland diet to avoid irritation from acidic foods.  Only take over-the-counter or prescription medicines for pain, discomfort, or fever as directed by your caregiver.  Follow up with your caregiver as instructed. You may need to see your caregiver for a wound check in 48 to 72 hours to make sure your wound is healing.  If your laceration was sutured, do not play with the sutures or knots with your tongue. If you do this, they will gradually loosen and may become untied. You may need a tetanus shot if:  You cannot remember when you had your last tetanus shot.  You have never had a tetanus shot. If you get a tetanus shot, your arm may swell, get red, and feel warm to the touch. This is common and not a problem. If you need a tetanus shot and you choose not to have one, there is a rare chance of getting tetanus. Sickness from tetanus can be serious. SEEK MEDICAL CARE IF:   You develop swelling or increasing pain in the wound or in other parts of your face.  You have a fever.  You develop swollen, tender glands in the  throat.  You notice the wound edges do not stay together after your sutures have been removed.  You see pus coming from the wound. Some drainage in the mouth is normal. MAKE SURE YOU:   Understand these instructions.  Will watch your condition.  Will get help right away if you are not doing well or get worse. Document Released: 11/27/2005 Document Revised: 02/19/2012 Document Reviewed: 06/01/2011 [facility] Patient Information 2015 [place], [place]. This information is not intended to replace advice given to you by your health care provider. Make sure you discuss any questions you have with your health care provider.

## 2015-03-23 NOTE — ED Notes (Signed)
Pts face cleaned w/ NS, steristrip applied to forehead abrasion

## 2015-04-01 DIAGNOSIS — S02401A Maxillary fracture, unspecified, initial encounter for closed fracture: Secondary | ICD-10-CM | POA: Diagnosis not present

## 2015-04-01 DIAGNOSIS — S022XXA Fracture of nasal bones, initial encounter for closed fracture: Secondary | ICD-10-CM | POA: Diagnosis not present

## 2015-05-06 ENCOUNTER — Ambulatory Visit (INDEPENDENT_AMBULATORY_CARE_PROVIDER_SITE_OTHER): Payer: Medicare Other | Admitting: Internal Medicine

## 2015-05-06 ENCOUNTER — Encounter: Payer: Self-pay | Admitting: Internal Medicine

## 2015-05-06 VITALS — BP 120/80 | HR 84 | Temp 98.1°F | Resp 20 | Ht 63.5 in | Wt 187.0 lb

## 2015-05-06 DIAGNOSIS — E119 Type 2 diabetes mellitus without complications: Secondary | ICD-10-CM

## 2015-05-06 DIAGNOSIS — Z0000 Encounter for general adult medical examination without abnormal findings: Secondary | ICD-10-CM

## 2015-05-06 DIAGNOSIS — M150 Primary generalized (osteo)arthritis: Secondary | ICD-10-CM

## 2015-05-06 DIAGNOSIS — M159 Polyosteoarthritis, unspecified: Secondary | ICD-10-CM

## 2015-05-06 DIAGNOSIS — I10 Essential (primary) hypertension: Secondary | ICD-10-CM | POA: Diagnosis not present

## 2015-05-06 DIAGNOSIS — I359 Nonrheumatic aortic valve disorder, unspecified: Secondary | ICD-10-CM

## 2015-05-06 LAB — LIPID PANEL
CHOLESTEROL: 130 mg/dL (ref 0–200)
HDL: 59.5 mg/dL (ref >39.00)
LDL CALC: 47 mg/dL (ref 0–99)
NonHDL: 70.5
TRIGLYCERIDES: 117 mg/dL (ref 0.0–149.0)
Total CHOL/HDL Ratio: 2
VLDL: 23.4 mg/dL (ref 0.0–40.0)

## 2015-05-06 LAB — CBC WITH DIFFERENTIAL/PLATELET
BASOS ABS: 0 10*3/uL (ref 0.0–0.1)
Basophils Relative: 0.5 % (ref 0.0–3.0)
EOS PCT: 0.2 % (ref 0.0–5.0)
Eosinophils Absolute: 0 10*3/uL (ref 0.0–0.7)
HCT: 40.8 % (ref 36.0–46.0)
HEMOGLOBIN: 13.2 g/dL (ref 12.0–15.0)
LYMPHS ABS: 1.4 10*3/uL (ref 0.7–4.0)
Lymphocytes Relative: 17.7 % (ref 12.0–46.0)
MCHC: 32.4 g/dL (ref 30.0–36.0)
MCV: 85.4 fl (ref 78.0–100.0)
MONOS PCT: 6.9 % (ref 3.0–12.0)
Monocytes Absolute: 0.5 10*3/uL (ref 0.1–1.0)
NEUTROS ABS: 5.7 10*3/uL (ref 1.4–7.7)
NEUTROS PCT: 74.7 % (ref 43.0–77.0)
Platelets: 251 10*3/uL (ref 150.0–400.0)
RBC: 4.77 Mil/uL (ref 3.87–5.11)
RDW: 14.3 % (ref 11.5–15.5)
WBC: 7.7 10*3/uL (ref 4.0–10.5)

## 2015-05-06 LAB — COMPREHENSIVE METABOLIC PANEL
ALT: 12 U/L (ref 0–35)
AST: 19 U/L (ref 0–37)
Albumin: 4.2 g/dL (ref 3.5–5.2)
Alkaline Phosphatase: 70 U/L (ref 39–117)
BUN: 18 mg/dL (ref 6–23)
CO2: 29 mEq/L (ref 19–32)
CREATININE: 1.15 mg/dL (ref 0.40–1.20)
Calcium: 9.4 mg/dL (ref 8.4–10.5)
Chloride: 100 mEq/L (ref 96–112)
GFR: 48.35 mL/min — AB (ref >60.00)
Glucose, Bld: 114 mg/dL — ABNORMAL HIGH (ref 70–99)
Potassium: 4.2 mEq/L (ref 3.5–5.1)
Sodium: 137 mEq/L (ref 135–145)
TOTAL PROTEIN: 7.3 g/dL (ref 6.0–8.3)
Total Bilirubin: 0.6 mg/dL (ref 0.2–1.2)

## 2015-05-06 LAB — HEMOGLOBIN A1C: Hgb A1c MFr Bld: 6.4 % (ref 4.6–6.5)

## 2015-05-06 LAB — MICROALBUMIN / CREATININE URINE RATIO
CREATININE, U: 73.9 mg/dL
MICROALB UR: 0.7 mg/dL (ref 0.0–1.9)
MICROALB/CREAT RATIO: 0.9 mg/g (ref 0.0–30.0)

## 2015-05-06 LAB — TSH: TSH: 1.12 u[IU]/mL (ref 0.35–4.50)

## 2015-05-06 MED ORDER — ATORVASTATIN CALCIUM 20 MG PO TABS: 20.0000 mg | ORAL_TABLET | Freq: Every day | ORAL | Status: DC

## 2015-05-06 NOTE — Progress Notes (Signed)
Pre visit review using our clinic review tool, if applicable. No additional management support is needed unless otherwise documented below in the visit note. 

## 2015-05-06 NOTE — Progress Notes (Signed)
Subjective:    Patient ID: [name], female    DOB: 04/24/1934, 81 y.o.   MRN: [id number]  HPI  55 -year-old patient who is seen today for a wellness exam.   Medical problems include diabetes, dyslipidemia, and hypertension. She has a remote history of tobacco use, but discontinued after approximately 15 years of use.  She has a history of cyanosis of the the feet of approximately 10 years duration.  No claudication.  Her main complaint is forgetfulness.  This been present for at least 1 and a half to 2 years.  She is concerned about simvastatin as a cause.  MMSE score 30 out of 30   Past Medical History  Diagnosis Date  . Depression   . Diabetes mellitus   . GERD (gastroesophageal reflux disease)   . Hypertension   . Hyperlipidemia   . CVA (cerebral infarction)   . Aortic stenosis   . Osteoarthritis     History   Social History  . Marital Status: Widowed    Spouse Name: N/A  . Number of Children: N/A  . Years of Education: N/A   Occupational History  . Not on file.   Social History Main Topics  . Smoking status: Former Smoker    Quit date: 02/07/1981  . Smokeless tobacco: Never Used  . Alcohol Use: No  . Drug Use: No  . Sexual Activity: Not on file   Other Topics Concern  . Not on file   Social History Narrative    Past Surgical History  Procedure Laterality Date  . Abdominal hysterectomy      fibroids, no cancer  . Joint replacement      left and right knee  . Rotator cuff repair    . Temporomandibular joint surgery    . Fracture surgery      left leg    Family History  Problem Relation Age of Onset  . Dementia Mother   . Hypertension Mother   . Heart attack Father   . Heart failure Sister     Allergies  Allergen Reactions  . Cymbalta [Duloxetine Hcl] Anaphylaxis  . Hydrochlorothiazide     REACTION: rash  . Oxycodone Other (See Comments)    Nightmares  . Ramipril     REACTION: difficulty breathing  . Telmisartan-Hctz    REACTION: rash    Current Outpatient Prescriptions on File Prior to Visit  Medication Sig Dispense Refill  . amoxicillin-clavulanate (AUGMENTIN) 875-125 MG per tablet Take 1 tablet by mouth 2 (two) times daily. 20 tablet 0  . aspirin 325 MG tablet Take 325 mg by mouth every morning.     . Aspirin-Salicylamide-Caffeine (BC HEADACHE PO) Take 1 packet by mouth daily as needed (headache).    . Cyanocobalamin (VITAMIN B 12 PO) Take 1,000 mcg by mouth every morning.     . furosemide (LASIX) 40 MG tablet TAKE 1 TABLET BY MOUTH EVERY DAY 90 tablet 1  . glucose blood (FREESTYLE TEST STRIPS) test strip 1 each by Other route daily. Use as instructed 100 each 12  . ibuprofen (ADVIL,MOTRIN) 200 MG tablet Take 400 mg by mouth every 6 (six) hours as needed for moderate pain (pain).    Marland Kitchen imipramine (TOFRANIL) 25 MG tablet TAKE 1 TABLET BY MOUTH ONCE A DAY 90 tablet 1  . KLOR-CON M20 20 MEQ tablet 1 TABLET DAILY (Patient taking differently: Take 1 tabley by mouth daily) 90 tablet 3  . metFORMIN (GLUCOPHAGE) 500 MG tablet TAKE 1 TABLET TWICE  A DAY WITH A MEAL 180 tablet 3  . Multiple Vitamin (MULTIVITAMIN) tablet Take 1 tablet by mouth daily.      Marland Kitchen NEXIUM 40 MG capsule TAKE ONE CAPSULE BY MOUTH EVERY DAY 90 capsule 3  . simvastatin (ZOCOR) 20 MG tablet Take 1 tablet (20 mg total) by mouth at bedtime. 90 tablet 1  . verapamil (VERELAN PM) 360 MG 24 hr capsule TAKE ONE CAPSULE BY MOUTH AT BEDTIME 90 capsule 1   No current facility-administered medications on file prior to visit.    BP 120/80 mmHg  Pulse 84  Temp(Src) 98.1 F (36.7 C) (Oral)  Resp 20  Ht 5' 3.5" (1.613 m)  Wt 187 lb (84.823 kg)  BMI 32.60 kg/m2  SpO2 96%   1. Risk factors, based on past  M,S,F history.    The risk factors include diabetes, hypertension, dyslipidemia, and remote tobacco use  2.  Physical activities: Fairly active and attempts at exercise regularly.  Does use a home treadmill.    3.  Depression/mood: History  depression, but no longer on therapy.  4.  Hearing: Moderate hearing loss 5.  ADL's: Independent in all aspects of daily living 6.  Fall risk: Moderate due to weight and age.  Has fallen twice over the past year  7.  Home safety: No problems identified.  Lives with her daughter  74.  Height weight, and visual acuity; height and weight stable.  No change in visual acuity does have annual eye examination  9.  Counseling: Heart healthy diet, weight loss encouraged  10. Lab orders based on risk factors: Laboratory profile be reviewed including hemoglobin A1c and urine for microalbumin  11. Referral : Not appropriate at this time  12. Care plan: Continue aggressive risk factor modification  13. Cognitive assessment: Alert and oriented with normal affect.  No cognitive dysfunction  14.  The patient was provided a written and personalized preventive care plan for the next 5-10 years.  Yearly eye examinations recommended as well as annual preventive care evaluations  with screening lab.  Continue to have periodic hemoglobin A1c's.  Also consider a screening Mammograms every 2 years.  Screening colonoscopy is no longer recommended.     Review of Systems  Constitutional: Positive for fatigue. Negative for fever, appetite change and unexpected weight change.  HENT: Negative for congestion, dental problem, ear pain, hearing loss, mouth sores, nosebleeds, sinus pressure, sore throat, tinnitus, trouble swallowing and voice change.   Eyes: Negative for photophobia, pain, redness and visual disturbance.  Respiratory: Positive for shortness of breath. Negative for cough and chest tightness.   Cardiovascular: Negative for chest pain, palpitations and leg swelling.  Gastrointestinal: Negative for nausea, vomiting, abdominal pain, diarrhea, constipation, blood in stool, abdominal distention and rectal pain.  Genitourinary: Negative for dysuria, urgency, frequency, hematuria, flank pain, vaginal bleeding,  vaginal discharge, difficulty urinating, genital sores, vaginal pain, menstrual problem and pelvic pain.  Musculoskeletal: Negative for back pain, arthralgias and neck stiffness.  Skin: Negative for rash.  Neurological: Negative for dizziness, syncope, speech difficulty, weakness, light-headedness, numbness and headaches.  Hematological: Negative for adenopathy. Does not bruise/bleed easily.  Psychiatric/Behavioral: Negative for suicidal ideas, behavioral problems, self-injury, dysphoric mood and agitation. The patient is not nervous/anxious.        Objective:   Physical Exam  Constitutional: She is oriented to person, place, and time. She appears well-developed and well-nourished.  HENT:  Head: Normocephalic and atraumatic.  Right Ear: External ear normal.  Left Ear: External ear  normal.  Mouth/Throat: Oropharynx is clear and moist.  Eyes: Conjunctivae and EOM are normal.  Neck: Normal range of motion. Neck supple. No JVD present. No thyromegaly present.  Cardiovascular: Normal rate, regular rhythm, normal heart sounds and intact distal pulses.   No murmur heard. Grade 2/6 systolic murmur Pedal pulses intact except for an absent right dorsalis pedis pulse  Pulmonary/Chest: Effort normal and breath sounds normal. She has no wheezes. She has no rales.  O2 saturation 98  Abdominal: Soft. Bowel sounds are normal. She exhibits no distension and no mass. There is no tenderness. There is no rebound and no guarding.  Genitourinary: Vagina normal.  Musculoskeletal: Normal range of motion. She exhibits no edema or tenderness.  Neurological: She is alert and oriented to person, place, and time. She has normal reflexes. No cranial nerve deficit. She exhibits normal muscle tone. Coordination normal.  Skin: Skin is warm and dry. No rash noted.  Feet cool and cyanotic  Psychiatric: She has a normal mood and affect. Her behavior is normal.          Assessment & Plan:   Preventive health  examination Diabetes mellitus.  We'll check a hemoglobin A1c and urine for microalbumin Hypertension well controlled .  Patient does have trivial AS Forgetfulness.  MMSE score of 30.  Will observe.  She is concerned about simvastatin as a possible cause.  We'll switch to atorvastatin

## 2015-05-06 NOTE — Patient Instructions (Signed)
Please check your hemoglobin A1c every 3 months  Limit your sodium (Salt) intake    It is important that you exercise regularly, at least 20 minutes 3 to 4 times per week.  If you develop chest pain or shortness of breath seek  medical attention.   Yearly eye examinations recommended as well as annual preventive care evaluations  with screening lab.  Continue to have periodic hemoglobin A1c's.  Also consider a screening Mammograms every 2 years.  Screening colonoscopy is no longer recommended.  Health Maintenance Adopting a healthy lifestyle and getting preventive care can go a long way to promote health and wellness. Talk with your health care provider about what schedule of regular examinations is right for you. This is a good chance for you to check in with your provider about disease prevention and staying healthy. In between checkups, there are plenty of things you can do on your own. Experts have done a lot of research about which lifestyle changes and preventive measures are most likely to keep you healthy. Ask your health care provider for more information. WEIGHT AND DIET  Eat a healthy diet  Be sure to include plenty of vegetables, fruits, low-fat dairy products, and lean protein.  Do not eat a lot of foods high in solid fats, added sugars, or salt.  Get regular exercise. This is one of the most important things you can do for your health.  Most adults should exercise for at least 150 minutes each week. The exercise should increase your heart rate and make you sweat (moderate-intensity exercise).  Most adults should also do strengthening exercises at least twice a week. This is in addition to the moderate-intensity exercise.  Maintain a healthy weight  Body mass index (BMI) is a measurement that can be used to identify possible weight problems. It estimates body fat based on height and weight. Your health care provider can help determine your BMI and help you achieve or maintain  a healthy weight.  For females 23 years of age and older:   A BMI below 18.5 is considered underweight.  A BMI of 18.5 to 24.9 is normal.  A BMI of 25 to 29.9 is considered overweight.  A BMI of 30 and above is considered obese.  Watch levels of cholesterol and blood lipids  You should start having your blood tested for lipids and cholesterol at 79 years of age, then have this test every 5 years.  You may need to have your cholesterol levels checked more often if:  Your lipid or cholesterol levels are high.  You are older than 79 years of age.  You are at high risk for heart disease.  CANCER SCREENING   Lung Cancer  Lung cancer screening is recommended for adults 21-68 years old who are at high risk for lung cancer because of a history of smoking.  A yearly low-dose CT scan of the lungs is recommended for people who:  Currently smoke.  Have quit within the past 15 years.  Have at least a 30-pack-year history of smoking. A pack year is smoking an average of one pack of cigarettes a day for 1 year.  Yearly screening should continue until it has been 15 years since you quit.  Yearly screening should stop if you develop a health problem that would prevent you from having lung cancer treatment.  Breast Cancer  Practice breast self-awareness. This means understanding how your breasts normally appear and feel.  It also means doing regular breast self-exams.  Let your health care provider know about any changes, no matter how small.  If you are in your 20s or 30s, you should have a clinical breast exam (CBE) by a health care provider every 1-3 years as part of a regular health exam.  If you are 87 or older, have a CBE every year. Also consider having a breast X-ray (mammogram) every year.  If you have a family history of breast cancer, talk to your health care provider about genetic screening.  If you are at high risk for breast cancer, talk to your health care provider  about having an MRI and a mammogram every year.  Breast cancer gene (BRCA) assessment is recommended for women who have family members with BRCA-related cancers. BRCA-related cancers include:  Breast.  Ovarian.  Tubal.  Peritoneal cancers.  Results of the assessment will determine the need for genetic counseling and BRCA1 and BRCA2 testing. Cervical Cancer Routine pelvic examinations to screen for cervical cancer are no longer recommended for nonpregnant women who are considered low risk for cancer of the pelvic organs (ovaries, uterus, and vagina) and who do not have symptoms. A pelvic examination may be necessary if you have symptoms including those associated with pelvic infections. Ask your health care provider if a screening pelvic exam is right for you.   The Pap test is the screening test for cervical cancer for women who are considered at risk.  If you had a hysterectomy for a problem that was not cancer or a condition that could lead to cancer, then you no longer need Pap tests.  If you are older than 65 years, and you have had normal Pap tests for the past 10 years, you no longer need to have Pap tests.  If you have had past treatment for cervical cancer or a condition that could lead to cancer, you need Pap tests and screening for cancer for at least 20 years after your treatment.  If you no longer get a Pap test, assess your risk factors if they change (such as having a new sexual partner). This can affect whether you should start being screened again.  Some women have medical problems that increase their chance of getting cervical cancer. If this is the case for you, your health care provider may recommend more frequent screening and Pap tests.  The human papillomavirus (HPV) test is another test that may be used for cervical cancer screening. The HPV test looks for the virus that can cause cell changes in the cervix. The cells collected during the Pap test can be tested for  HPV.  The HPV test can be used to screen women 7 years of age and older. Getting tested for HPV can extend the interval between normal Pap tests from three to five years.  An HPV test also should be used to screen women of any age who have unclear Pap test results.  After 79 years of age, women should have HPV testing as often as Pap tests.  Colorectal Cancer  This type of cancer can be detected and often prevented.  Routine colorectal cancer screening usually begins at 79 years of age and continues through 79 years of age.  Your health care provider may recommend screening at an earlier age if you have risk factors for colon cancer.  Your health care provider may also recommend using home test kits to check for hidden blood in the stool.  A small camera at the end of a tube can be used  to examine your colon directly (sigmoidoscopy or colonoscopy). This is done to check for the earliest forms of colorectal cancer.  Routine screening usually begins at age 32.  Direct examination of the colon should be repeated every 5-10 years through 79 years of age. However, you may need to be screened more often if early forms of precancerous polyps or small growths are found. Skin Cancer  Check your skin from head to toe regularly.  Tell your health care provider about any new moles or changes in moles, especially if there is a change in a mole's shape or color.  Also tell your health care provider if you have a mole that is larger than the size of a pencil eraser.  Always use sunscreen. Apply sunscreen liberally and repeatedly throughout the day.  Protect yourself by wearing long sleeves, pants, a wide-brimmed hat, and sunglasses whenever you are outside. HEART DISEASE, DIABETES, AND HIGH BLOOD PRESSURE   Have your blood pressure checked at least every 1-2 years. High blood pressure causes heart disease and increases the risk of stroke.  If you are between 53 years and 33 years old, ask  your health care provider if you should take aspirin to prevent strokes.  Have regular diabetes screenings. This involves taking a blood sample to check your fasting blood sugar level.  If you are at a normal weight and have a low risk for diabetes, have this test once every three years after 79 years of age.  If you are overweight and have a high risk for diabetes, consider being tested at a younger age or more often. PREVENTING INFECTION  Hepatitis B  If you have a higher risk for hepatitis B, you should be screened for this virus. You are considered at high risk for hepatitis B if:  You were born in a country where hepatitis B is common. Ask your health care provider which countries are considered high risk.  Your parents were born in a high-risk country, and you have not been immunized against hepatitis B (hepatitis B vaccine).  You have HIV or AIDS.  You use needles to inject street drugs.  You live with someone who has hepatitis B.  You have had sex with someone who has hepatitis B.  You get hemodialysis treatment.  You take certain medicines for conditions, including cancer, organ transplantation, and autoimmune conditions. Hepatitis C  Blood testing is recommended for:  Everyone born from [birth year] through 1963.  Anyone with known risk factors for hepatitis C. Sexually transmitted infections (STIs)  You should be screened for sexually transmitted infections (STIs) including gonorrhea and chlamydia if:  You are sexually active and are younger than 79 years of age.  You are older than 79 years of age and your health care provider tells you that you are at risk for this type of infection.  Your sexual activity has changed since you were last screened and you are at an increased risk for chlamydia or gonorrhea. Ask your health care provider if you are at risk.  If you do not have HIV, but are at risk, it may be recommended that you take a prescription medicine daily to  prevent HIV infection. This is called pre-exposure prophylaxis (PrEP). You are considered at risk if:  You are sexually active and do not regularly use condoms or know the HIV status of your partner(s).  You take drugs by injection.  You are sexually active with a partner who has HIV. Talk with your health care provider  about whether you are at high risk of being infected with HIV. If you choose to begin PrEP, you should first be tested for HIV. You should then be tested every 3 months for as long as you are taking PrEP.  PREGNANCY   If you are premenopausal and you may become pregnant, ask your health care provider about preconception counseling.  If you may become pregnant, take 400 to 800 micrograms (mcg) of folic acid every day.  If you want to prevent pregnancy, talk to your health care provider about birth control (contraception). OSTEOPOROSIS AND MENOPAUSE   Osteoporosis is a disease in which the bones lose minerals and strength with aging. This can result in serious bone fractures. Your risk for osteoporosis can be identified using a bone density scan.  If you are 92 years of age or older, or if you are at risk for osteoporosis and fractures, ask your health care provider if you should be screened.  Ask your health care provider whether you should take a calcium or vitamin D supplement to lower your risk for osteoporosis.  Menopause may have certain physical symptoms and risks.  Hormone replacement therapy may reduce some of these symptoms and risks. Talk to your health care provider about whether hormone replacement therapy is right for you.  HOME CARE INSTRUCTIONS   Schedule regular health, dental, and eye exams.  Stay current with your immunizations.   Do not use any tobacco products including cigarettes, chewing tobacco, or electronic cigarettes.  If you are pregnant, do not drink alcohol.  If you are breastfeeding, limit how much and how often you drink  alcohol.  Limit alcohol intake to no more than 1 drink per day for nonpregnant women. One drink equals 12 ounces of beer, 5 ounces of wine, or 1 ounces of hard liquor.  Do not use street drugs.  Do not share needles.  Ask your health care provider for help if you need support or information about quitting drugs.  Tell your health care provider if you often feel depressed.  Tell your health care provider if you have ever been abused or do not feel safe at home. Document Released: 06/12/2011 Document Revised: 04/13/2014 Document Reviewed: 10/29/2013 [facility] Patient Information 2015 [place], [place]. This information is not intended to replace advice given to you by your health care provider. Make sure you discuss any questions you have with your health care provider.

## 2015-05-07 ENCOUNTER — Telehealth: Payer: Self-pay

## 2015-05-21 ENCOUNTER — Other Ambulatory Visit: Payer: Self-pay | Admitting: Internal Medicine

## 2015-06-09 ENCOUNTER — Other Ambulatory Visit: Payer: Self-pay | Admitting: Internal Medicine

## 2015-06-21 ENCOUNTER — Other Ambulatory Visit: Payer: Self-pay | Admitting: Internal Medicine

## 2015-08-29 ENCOUNTER — Other Ambulatory Visit: Payer: Self-pay | Admitting: Internal Medicine

## 2015-10-04 DIAGNOSIS — E119 Type 2 diabetes mellitus without complications: Secondary | ICD-10-CM | POA: Diagnosis not present

## 2015-10-04 DIAGNOSIS — Z961 Presence of intraocular lens: Secondary | ICD-10-CM | POA: Diagnosis not present

## 2015-10-04 LAB — HM DIABETES EYE EXAM

## 2015-10-07 ENCOUNTER — Encounter: Payer: Self-pay | Admitting: Internal Medicine

## 2015-11-03 DIAGNOSIS — Z23 Encounter for immunization: Secondary | ICD-10-CM | POA: Diagnosis not present

## 2015-11-09 ENCOUNTER — Other Ambulatory Visit: Payer: Self-pay | Admitting: Internal Medicine

## 2015-11-16 ENCOUNTER — Other Ambulatory Visit: Payer: Self-pay | Admitting: Internal Medicine

## 2015-12-19 ENCOUNTER — Other Ambulatory Visit: Payer: Self-pay | Admitting: Internal Medicine

## 2015-12-23 ENCOUNTER — Other Ambulatory Visit: Payer: Self-pay | Admitting: Internal Medicine

## 2016-02-14 ENCOUNTER — Other Ambulatory Visit: Payer: Self-pay | Admitting: Internal Medicine

## 2016-04-21 ENCOUNTER — Encounter: Payer: Self-pay | Admitting: Internal Medicine

## 2016-04-21 ENCOUNTER — Ambulatory Visit (INDEPENDENT_AMBULATORY_CARE_PROVIDER_SITE_OTHER): Payer: Medicare Other | Admitting: Internal Medicine

## 2016-04-21 VITALS — BP 138/90 | HR 96 | Temp 98.3°F | Resp 20 | Ht 63.5 in | Wt 195.0 lb

## 2016-04-21 DIAGNOSIS — I10 Essential (primary) hypertension: Secondary | ICD-10-CM | POA: Diagnosis not present

## 2016-04-21 DIAGNOSIS — Z8679 Personal history of other diseases of the circulatory system: Secondary | ICD-10-CM

## 2016-04-21 DIAGNOSIS — I359 Nonrheumatic aortic valve disorder, unspecified: Secondary | ICD-10-CM | POA: Diagnosis not present

## 2016-04-21 DIAGNOSIS — M150 Primary generalized (osteo)arthritis: Secondary | ICD-10-CM

## 2016-04-21 DIAGNOSIS — E785 Hyperlipidemia, unspecified: Secondary | ICD-10-CM | POA: Diagnosis not present

## 2016-04-21 DIAGNOSIS — E1140 Type 2 diabetes mellitus with diabetic neuropathy, unspecified: Secondary | ICD-10-CM | POA: Insufficient documentation

## 2016-04-21 DIAGNOSIS — M159 Polyosteoarthritis, unspecified: Secondary | ICD-10-CM

## 2016-04-21 DIAGNOSIS — E0840 Diabetes mellitus due to underlying condition with diabetic neuropathy, unspecified: Secondary | ICD-10-CM | POA: Diagnosis not present

## 2016-04-21 LAB — CBC WITH DIFFERENTIAL/PLATELET
BASOS PCT: 0.4 % (ref 0.0–3.0)
Basophils Absolute: 0 10*3/uL (ref 0.0–0.1)
EOS PCT: 0.2 % (ref 0.0–5.0)
Eosinophils Absolute: 0 10*3/uL (ref 0.0–0.7)
HEMATOCRIT: 41.7 % (ref 36.0–46.0)
Hemoglobin: 13.9 g/dL (ref 12.0–15.0)
LYMPHS PCT: 14.1 % (ref 12.0–46.0)
Lymphs Abs: 1.3 10*3/uL (ref 0.7–4.0)
MCHC: 33.4 g/dL (ref 30.0–36.0)
MCV: 89.2 fl (ref 78.0–100.0)
MONOS PCT: 7.2 % (ref 3.0–12.0)
Monocytes Absolute: 0.7 10*3/uL (ref 0.1–1.0)
NEUTROS ABS: 7.4 10*3/uL (ref 1.4–7.7)
Neutrophils Relative %: 78.1 % — ABNORMAL HIGH (ref 43.0–77.0)
PLATELETS: 269 10*3/uL (ref 150.0–400.0)
RBC: 4.68 Mil/uL (ref 3.87–5.11)
RDW: 13.8 % (ref 11.5–15.5)
WBC: 9.5 10*3/uL (ref 4.0–10.5)

## 2016-04-21 LAB — COMPREHENSIVE METABOLIC PANEL
ALT: 17 U/L (ref 0–35)
AST: 20 U/L (ref 0–37)
Albumin: 4.3 g/dL (ref 3.5–5.2)
Alkaline Phosphatase: 71 U/L (ref 39–117)
BUN: 15 mg/dL (ref 6–23)
CHLORIDE: 100 meq/L (ref 96–112)
CO2: 29 meq/L (ref 19–32)
Calcium: 9.7 mg/dL (ref 8.4–10.5)
Creatinine, Ser: 1.05 mg/dL (ref 0.40–1.20)
GFR: 53.57 mL/min — ABNORMAL LOW (ref >60.00)
Glucose, Bld: 136 mg/dL — ABNORMAL HIGH (ref 70–99)
Potassium: 4.3 mEq/L (ref 3.5–5.1)
Sodium: 138 mEq/L (ref 135–145)
Total Bilirubin: 0.5 mg/dL (ref 0.2–1.2)
Total Protein: 7.3 g/dL (ref 6.0–8.3)

## 2016-04-21 LAB — LIPID PANEL
CHOL/HDL RATIO: 3
Cholesterol: 130 mg/dL (ref 0–200)
HDL: 42.4 mg/dL (ref >39.00)
LDL CALC: 51 mg/dL (ref 0–99)
NONHDL: 87.93
Triglycerides: 184 mg/dL — ABNORMAL HIGH (ref 0.0–149.0)
VLDL: 36.8 mg/dL (ref 0.0–40.0)

## 2016-04-21 LAB — MICROALBUMIN / CREATININE URINE RATIO
Creatinine,U: 76.8 mg/dL
Microalb Creat Ratio: 0.9 mg/g (ref 0.0–30.0)
Microalb, Ur: <0.7 mg/dL (ref 0.0–1.9)

## 2016-04-21 LAB — SEDIMENTATION RATE: Sed Rate: 25 mm/hr — ABNORMAL HIGH (ref 0–22)

## 2016-04-21 LAB — HEMOGLOBIN A1C: Hgb A1c MFr Bld: 7.4 % — ABNORMAL HIGH (ref 4.6–6.5)

## 2016-04-21 LAB — TSH: TSH: 1.97 u[IU]/mL (ref 0.35–4.50)

## 2016-04-21 MED ORDER — AMLODIPINE BESYLATE 5 MG PO TABS: 5.0000 mg | ORAL_TABLET | Freq: Every day | ORAL | Status: DC

## 2016-04-21 NOTE — Progress Notes (Signed)
Subjective:    Patient ID: [name], female    DOB: [birth year], 82 y.o.   MRN: [id number]  HPI 83 year old patient with multiple medical problems including type 2 diabetes.  She has not been seen in approximately one year.  Over this period time.  She has moved and presently lives with her daughter  Complaints include a 3 month history of fatigue and low back pain.  She states that she has had tingling of the feet for several months.  She states that after one block.  She becomes quite weak.  On 2 occasions over the past 2 months , she almost fell because her right foot gave way. She does have a history of cyanosis of the feet for a number of years.  In 2014 lower extremity Doppler studies were normal.  She states that her hands have become cool and cyanotic.  Since her diagnosis of diabetes.  She describes some weight gain and normal appetite She does have a history of mild aortic stenosis noted on echocardiogram in 2013.  This revealed a mean gradient of 11 mmHg  Wt Readings from Last 3 Encounters:  04/21/16 195 lb (88.451 kg)  05/06/15 187 lb (84.823 kg)  12/20/14 180 lb (81.647 kg)    Lab Results  Component Value Date   HGBA1C 6.4 05/06/2015     Past Medical History  Diagnosis Date  . Depression   . Diabetes mellitus   . GERD (gastroesophageal reflux disease)   . Hypertension   . Hyperlipidemia   . CVA (cerebral infarction)   . Aortic stenosis   . Osteoarthritis      Social History   Social History  . Marital Status: Widowed    Spouse Name: N/A  . Number of Children: N/A  . Years of Education: N/A   Occupational History  . Not on file.   Social History Main Topics  . Smoking status: Former Smoker    Quit date: 02/07/1981  . Smokeless tobacco: Never Used  . Alcohol Use: No  . Drug Use: No  . Sexual Activity: Not on file   Other Topics Concern  . Not on file   Social History Narrative    Past Surgical History  Procedure Laterality Date  .  Abdominal hysterectomy      fibroids, no cancer  . Joint replacement      left and right knee  . Rotator cuff repair    . Temporomandibular joint surgery    . Fracture surgery      left leg    Family History  Problem Relation Age of Onset  . Dementia Mother   . Hypertension Mother   . Heart attack Father   . Heart failure Sister     Allergies  Allergen Reactions  . Cymbalta [Duloxetine Hcl] Anaphylaxis  . Hydrochlorothiazide     REACTION: rash  . Oxycodone Other (See Comments)    Nightmares  . Ramipril     REACTION: difficulty breathing  . Telmisartan-Hctz     REACTION: rash    Current Outpatient Prescriptions on File Prior to Visit  Medication Sig Dispense Refill  . aspirin 325 MG tablet Take 325 mg by mouth every morning.     . Aspirin-Salicylamide-Caffeine (BC HEADACHE PO) Take 1 packet by mouth daily as needed (headache).    Marland Kitchen atorvastatin (LIPITOR) 20 MG tablet Take 1 tablet (20 mg total) by mouth daily. 90 tablet 3  . Cyanocobalamin (VITAMIN B 12 PO) Take 1,000 mcg by mouth  every morning.     Marland Kitchen esomeprazole (NEXIUM) 40 MG capsule TAKE ONE CAPSULE BY MOUTH EVERY DAY 90 capsule 3  . furosemide (LASIX) 40 MG tablet TAKE 1 TABLET BY MOUTH EVERY DAY 90 tablet 1  . glucose blood (FREESTYLE TEST STRIPS) test strip 1 each by Other route daily. Use as instructed 100 each 12  . ibuprofen (ADVIL,MOTRIN) 200 MG tablet Take 400 mg by mouth every 6 (six) hours as needed for moderate pain (pain).    Marland Kitchen imipramine (TOFRANIL) 25 MG tablet TAKE 1 TABLET BY MOUTH EVERY DAY 90 tablet 1  . KLOR-CON M20 20 MEQ tablet TAKE 1 TABLET BY MOUTH DAILY 90 tablet 1  . metFORMIN (GLUCOPHAGE) 500 MG tablet TAKE 1 TABLET TWICE A DAY WITH A MEAL 180 tablet 1  . Multiple Vitamin (MULTIVITAMIN) tablet Take 1 tablet by mouth daily.      . verapamil (VERELAN PM) 360 MG 24 hr capsule TAKE ONE CAPSULE BY MOUTH AT BEDTIME 90 capsule 1   No current facility-administered medications on file prior to visit.      BP 138/90 mmHg  Pulse 96  Temp(Src) 98.3 F (36.8 C) (Oral)  Resp 20  Ht 5' 3.5" (1.613 m)  Wt 195 lb (88.451 kg)  BMI 34.00 kg/m2  SpO2 95%      Review of Systems  Constitutional: Positive for activity change, fatigue and unexpected weight change.  HENT: Negative for congestion, dental problem, hearing loss, rhinorrhea, sinus pressure, sore throat and tinnitus.   Eyes: Negative for pain, discharge and visual disturbance.  Respiratory: Positive for shortness of breath. Negative for cough.   Cardiovascular: Negative for chest pain, palpitations and leg swelling.  Gastrointestinal: Negative for nausea, vomiting, abdominal pain, diarrhea, constipation, blood in stool and abdominal distention.  Genitourinary: Negative for dysuria, urgency, frequency, hematuria, flank pain, vaginal bleeding, vaginal discharge, difficulty urinating, vaginal pain and pelvic pain.  Musculoskeletal: Negative for joint swelling, arthralgias and gait problem.  Skin: Negative for rash.  Neurological: Positive for weakness and numbness. Negative for dizziness, syncope, speech difficulty and headaches.  Hematological: Negative for adenopathy.  Psychiatric/Behavioral: Negative for behavioral problems, dysphoric mood and agitation. The patient is not nervous/anxious.        Objective:   Physical Exam  Constitutional: She is oriented to person, place, and time. She appears well-developed and well-nourished. No distress.  Overweight Alert and appropriate Repeat blood pressure 120/84  O2 saturation 95-96%  HENT:  Head: Normocephalic and atraumatic.  Right Ear: External ear normal.  Left Ear: External ear normal.  Mouth/Throat: Oropharynx is clear and moist.  Eyes: Conjunctivae and EOM are normal.  Neck: Normal range of motion. Neck supple. No JVD present. No thyromegaly present.  Cardiovascular: Normal rate, regular rhythm, normal heart sounds and intact distal pulses.   No murmur heard. No  significant murmur  Pulmonary/Chest: Effort normal and breath sounds normal. She has no wheezes. She has no rales.  Abdominal: Soft. Bowel sounds are normal. She exhibits no distension and no mass. There is no tenderness. There is no rebound and no guarding.  Genitourinary: Vagina normal.  Musculoskeletal: Normal range of motion. She exhibits no edema or tenderness.  Neurological: She is alert and oriented to person, place, and time. She has normal reflexes. No cranial nerve deficit. She exhibits normal muscle tone. Coordination normal.  Skin: Skin is warm and dry. No rash noted.  Peripheral cyanosis of the hands and the feet No ulceration or ischemic changes  Psychiatric: She has a  normal mood and affect. Her behavior is normal.          Assessment & Plan:   Diabetes mellitus with mild neuropathy Multiple somatic complaints.  Possible depression Peripheral cyanosis.  This appears to be a chronic problem.  We'll switch to amlodipine a more potent vaso-dilator History mild a S.  Doubt patient has progressed to hemodynamically significant aortic stenosis but will check a follow-up 2-D echo Osteoarthritis   Recheck one month Exercise, weight loss encouraged  Check diabetes parameters

## 2016-04-21 NOTE — Patient Instructions (Signed)
Limit your sodium (Salt) intake   Please check your hemoglobin A1c every 3 months    It is important that you exercise regularly, at least 20 minutes 3 to 4 times per week.  If you develop chest pain or shortness of breath seek  medical attention.  You need to lose weight.  Consider a lower calorie diet and regular exercise. 

## 2016-04-21 NOTE — Progress Notes (Signed)
Pre visit review using our clinic review tool, if applicable. No additional management support is needed unless otherwise documented below in the visit note. 

## 2016-04-22 LAB — HEPATITIS C ANTIBODY: HCV Ab: NEGATIVE

## 2016-05-09 ENCOUNTER — Other Ambulatory Visit: Payer: Self-pay

## 2016-05-09 ENCOUNTER — Ambulatory Visit (HOSPITAL_COMMUNITY): Payer: Medicare Other | Attending: Cardiovascular Disease

## 2016-05-09 DIAGNOSIS — Z87891 Personal history of nicotine dependence: Secondary | ICD-10-CM | POA: Insufficient documentation

## 2016-05-09 DIAGNOSIS — I359 Nonrheumatic aortic valve disorder, unspecified: Secondary | ICD-10-CM | POA: Diagnosis not present

## 2016-05-09 DIAGNOSIS — E0840 Diabetes mellitus due to underlying condition with diabetic neuropathy, unspecified: Secondary | ICD-10-CM

## 2016-05-09 DIAGNOSIS — I350 Nonrheumatic aortic (valve) stenosis: Secondary | ICD-10-CM | POA: Insufficient documentation

## 2016-05-09 DIAGNOSIS — I119 Hypertensive heart disease without heart failure: Secondary | ICD-10-CM | POA: Diagnosis not present

## 2016-05-09 DIAGNOSIS — E785 Hyperlipidemia, unspecified: Secondary | ICD-10-CM | POA: Diagnosis not present

## 2016-05-12 ENCOUNTER — Other Ambulatory Visit: Payer: Self-pay | Admitting: Internal Medicine

## 2016-06-22 ENCOUNTER — Other Ambulatory Visit: Payer: Self-pay | Admitting: Emergency Medicine

## 2016-06-22 MED ORDER — METFORMIN HCL 500 MG PO TABS: ORAL_TABLET | ORAL | Status: DC

## 2016-08-13 ENCOUNTER — Other Ambulatory Visit: Payer: Self-pay | Admitting: Internal Medicine

## 2016-08-16 NOTE — Telephone Encounter (Signed)
Rx refill sent to pharmacy. 

## 2016-11-07 ENCOUNTER — Other Ambulatory Visit: Payer: Self-pay | Admitting: Internal Medicine

## 2016-11-13 ENCOUNTER — Other Ambulatory Visit: Payer: Self-pay | Admitting: Internal Medicine

## 2016-11-17 ENCOUNTER — Other Ambulatory Visit: Payer: Self-pay

## 2016-12-22 ENCOUNTER — Other Ambulatory Visit: Payer: Self-pay | Admitting: Internal Medicine

## 2017-01-08 ENCOUNTER — Ambulatory Visit: Payer: Medicare Other | Admitting: Internal Medicine

## 2017-01-19 ENCOUNTER — Ambulatory Visit (INDEPENDENT_AMBULATORY_CARE_PROVIDER_SITE_OTHER): Payer: Medicare Other | Admitting: Internal Medicine

## 2017-01-19 ENCOUNTER — Encounter: Payer: Self-pay | Admitting: Internal Medicine

## 2017-01-19 VITALS — BP 118/62 | HR 97 | Temp 98.7°F | Ht 63.5 in | Wt 188.2 lb

## 2017-01-19 DIAGNOSIS — E0840 Diabetes mellitus due to underlying condition with diabetic neuropathy, unspecified: Secondary | ICD-10-CM

## 2017-01-19 DIAGNOSIS — I10 Essential (primary) hypertension: Secondary | ICD-10-CM | POA: Diagnosis not present

## 2017-01-19 DIAGNOSIS — M159 Polyosteoarthritis, unspecified: Secondary | ICD-10-CM

## 2017-01-19 DIAGNOSIS — M150 Primary generalized (osteo)arthritis: Secondary | ICD-10-CM

## 2017-01-19 DIAGNOSIS — Z8679 Personal history of other diseases of the circulatory system: Secondary | ICD-10-CM | POA: Diagnosis not present

## 2017-01-19 MED ORDER — METFORMIN HCL 500 MG PO TABS: 1000.0000 mg | ORAL_TABLET | Freq: Two times a day (BID) | ORAL | 3 refills | Status: DC

## 2017-01-19 MED ORDER — MELOXICAM 7.5 MG PO TABS: 7.5000 mg | ORAL_TABLET | Freq: Every day | ORAL | 0 refills | Status: DC

## 2017-01-19 NOTE — Progress Notes (Signed)
Subjective:    Patient ID: [name], female    DOB: 1934/04/23, 83 y.o.   MRN: [id number]  HPI  83 year old patient who has a history of essential hypertension and diabetes.  She has not been seen in about 9 months For the past 1 or 2 weeks.  She is complaining of some right hip pain She does have a history of osteoarthritis. Antihypertensive regimen includes furosemide.  Lab Results  Component Value Date   HGBA1C 7.4 (H) 04/21/2016   Past Medical History:  Diagnosis Date  . Aortic stenosis   . CVA (cerebral infarction)   . Depression   . Diabetes mellitus   . GERD (gastroesophageal reflux disease)   . Hyperlipidemia   . Hypertension   . Osteoarthritis      Social History   Social History  . Marital status: Widowed    Spouse name: N/A  . Number of children: N/A  . Years of education: N/A   Occupational History  . Not on file.   Social History Main Topics  . Smoking status: Former Smoker    Quit date: 02/07/1981  . Smokeless tobacco: Never Used  . Alcohol use No  . Drug use: No  . Sexual activity: Not on file   Other Topics Concern  . Not on file   Social History Narrative  . No narrative on file    Past Surgical History:  Procedure Laterality Date  . ABDOMINAL HYSTERECTOMY     fibroids, no cancer  . FRACTURE SURGERY     left leg  . JOINT REPLACEMENT     left and right knee  . ROTATOR CUFF REPAIR    . TEMPOROMANDIBULAR JOINT SURGERY      Family History  Problem Relation Age of Onset  . Dementia Mother   . Hypertension Mother   . Heart attack Father   . Heart failure Sister     Allergies  Allergen Reactions  . Cymbalta [Duloxetine Hcl] Anaphylaxis  . Hydrochlorothiazide     REACTION: rash  . Oxycodone Other (See Comments)    Nightmares  . Ramipril     REACTION: difficulty breathing  . Telmisartan-Hctz     REACTION: rash    Current Outpatient Prescriptions on File Prior to Visit  Medication Sig Dispense Refill  . amLODipine  (NORVASC) 5 MG tablet Take 1 tablet (5 mg total) by mouth daily. 90 tablet 3  . aspirin 325 MG tablet Take 325 mg by mouth every morning.     . Aspirin-Salicylamide-Caffeine (BC HEADACHE PO) Take 1 packet by mouth daily as needed (headache).    Marland Kitchen atorvastatin (LIPITOR) 20 MG tablet TAKE 1 TABLET (20 MG TOTAL) BY MOUTH DAILY. 90 tablet 1  . Cyanocobalamin (VITAMIN B 12 PO) Take 1,000 mcg by mouth every morning.     Marland Kitchen esomeprazole (NEXIUM) 40 MG capsule TAKE ONE CAPSULE BY MOUTH EVERY DAY 30 capsule 0  . glucose blood (FREESTYLE TEST STRIPS) test strip 1 each by Other route daily. Use as instructed 100 each 12  . ibuprofen (ADVIL,MOTRIN) 200 MG tablet Take 400 mg by mouth every 6 (six) hours as needed for moderate pain (pain).    Marland Kitchen imipramine (TOFRANIL) 25 MG tablet TAKE 1 TABLET BY MOUTH EVERY DAY 90 tablet 1  . Multiple Vitamin (MULTIVITAMIN) tablet Take 1 tablet by mouth daily.      . verapamil (VERELAN PM) 360 MG 24 hr capsule TAKE ONE CAPSULE BY MOUTH AT BEDTIME 90 capsule 1   No  current facility-administered medications on file prior to visit.     BP 118/62 (BP Location: Right Arm, Patient Position: Sitting, Cuff Size: Normal)   Pulse 97   Temp 98.7 F (37.1 C) (Oral)   Ht 5' 3.5" (1.613 m)   Wt 188 lb 3.2 oz (85.4 kg)   SpO2 93%   BMI 32.82 kg/m     Review of Systems  Constitutional: Negative.   HENT: Negative for congestion, dental problem, hearing loss, rhinorrhea, sinus pressure, sore throat and tinnitus.   Eyes: Negative for pain, discharge and visual disturbance.  Respiratory: Negative for cough and shortness of breath.   Cardiovascular: Negative for chest pain, palpitations and leg swelling.  Gastrointestinal: Negative for abdominal distention, abdominal pain, blood in stool, constipation, diarrhea, nausea and vomiting.  Genitourinary: Negative for difficulty urinating, dysuria, flank pain, frequency, hematuria, pelvic pain, urgency, vaginal bleeding, vaginal discharge  and vaginal pain.  Musculoskeletal: Positive for arthralgias and gait problem. Negative for joint swelling.  Skin: Negative for rash.  Neurological: Negative for dizziness, syncope, speech difficulty, weakness, numbness and headaches.  Hematological: Negative for adenopathy.  Psychiatric/Behavioral: Negative for agitation, behavioral problems and dysphoric mood. The patient is not nervous/anxious.        Objective:   Physical Exam  Constitutional: She is oriented to person, place, and time. She appears well-developed and well-nourished.  Blood pressure as low as 90 over 60 right arm  HENT:  Head: Normocephalic.  Right Ear: External ear normal.  Left Ear: External ear normal.  Mouth/Throat: Oropharynx is clear and moist.  Eyes: Conjunctivae and EOM are normal. Pupils are equal, round, and reactive to light.  Neck: Normal range of motion. Neck supple. No thyromegaly present.  Cardiovascular: Normal rate, regular rhythm, normal heart sounds and intact distal pulses.   Pulmonary/Chest: Effort normal and breath sounds normal.  Abdominal: Soft. Bowel sounds are normal. She exhibits no mass. There is no tenderness.  Musculoskeletal: Normal range of motion.  Painful range of motion right hip  Lymphadenopathy:    She has no cervical adenopathy.  Neurological: She is alert and oriented to person, place, and time.  Skin: Skin is warm and dry. No rash noted.  Psychiatric: She has a normal mood and affect. Her behavior is normal.          Assessment & Plan:   Right hip pain, suspect osteoarthritis  Will treat with short-term anti-inflammatory medications and observe.  If unimproved, will obtain an x-ray and possibly orthopedic referral Essential hypertension.  Blood pressure low normal.  We'll discontinue furosemide and potassium supplementation Diabetes mellitus.  Check hemoglobin A1c  Follow-up 3 months  [id number],PETER Pilar Plate

## 2017-01-19 NOTE — Progress Notes (Signed)
Pre visit review using our clinic review tool, if applicable. No additional management support is needed unless otherwise documented below in the visit note. 

## 2017-01-19 NOTE — Patient Instructions (Signed)
Please check your hemoglobin A1c every 3 months  Limit your sodium (Salt) intake  DISCONTINUE FUROSEMIDE  DISCONTINUE POTASSIUM SUPPLEMENTATION  Please check your blood pressure on a regular basis.  If it is consistently greater than 150/90, please make an office appointment.  Return in 3 months for follow-up

## 2017-01-21 ENCOUNTER — Other Ambulatory Visit: Payer: Self-pay | Admitting: Internal Medicine

## 2017-01-22 ENCOUNTER — Other Ambulatory Visit: Payer: Self-pay | Admitting: Internal Medicine

## 2017-01-23 ENCOUNTER — Telehealth: Payer: Self-pay

## 2017-01-23 NOTE — Telephone Encounter (Signed)
Call to schedule AWV and left vm for call back

## 2017-02-23 ENCOUNTER — Other Ambulatory Visit: Payer: Self-pay | Admitting: Internal Medicine

## 2017-02-28 ENCOUNTER — Telehealth: Payer: Self-pay

## 2017-02-28 NOTE — Telephone Encounter (Signed)
Call to schedule AWV Agreed to come in Tuesday 3/27 at 4pm

## 2017-03-06 ENCOUNTER — Ambulatory Visit: Payer: Medicare Other

## 2017-03-06 NOTE — Progress Notes (Deleted)
Subjective:   [name] is a 83 y.o. female who presents for Medicare Annual (Subsequent) preventive examination.  The Patient was informed that the wellness visit is to identify future health risk and educate and initiate measures that can reduce risk for increased disease through the lifespan.    NO ROS; Medicare Wellness Visit OV 2/19 Dr. Raliegh Ip  Labs due 04/22/2016 to schedule apt with Dr. Raliegh Ip   Describes health as good, fair or great?   Preventive Screening -Counseling & Management  Sigmoid 12/1999  Mammogram 10/2011  Smoking history - 1982 was her quit date 30 pack hx: Educated regarding LDCT if applicable with a 30 year hx Also educated regarding AAA for female tobacco users with smoking hx  Smokeless tobacco  Second Hand Smoke status; No Smokers in the home ETOH -no  Medication adherence or issues?   RISK FACTORS Diet Regular exercise   Cardiac Risk Factors:  Advanced aged > 39 in men; >65 in women Hyperlipidemia - ratio 3; hdl 42 and trig 184  Diabetes 136 and 7.4 04/2016 Family History  -dementia; HTN; MI  Obesity  Fall risk  Given education on "Fall Prevention in the Home" for more safety tips the patient can apply as appropriate.  Long term goal is to "age in place" or undecided   Mobility of Functional changes this year? Safety; community, wears sunscreen, safe place for firearms; Motor vehicle accidents;   Mental Health:  Any emotional problems? Anxious, depressed, irritable, sad or blue?  Denies feeling depressed or hopeless; voices pleasure in daily life How many social activities have you been engaged in within the last 2 weeks? Who would help you with chores; illness; shopping other?  Eye exam - 09/2015   Activities of Daily Living - See functional screen   Cognitive testing; Ad8 score; 0 or less than 2  MMSE deferred or completed if AD8 + 2 issues  Advanced Directives   List the name of Physicians or other Practitioners you currently use:     Immunization History  Administered Date(s) Administered  . Influenza Split 09/04/2011, 09/16/2012, 10/24/2013  . Influenza Whole 11/01/2007, 08/31/2009, 09/10/2010  . Influenza, High Dose Seasonal PF 10/07/2016  . Pneumococcal Conjugate-13 11/13/2013  . Pneumococcal Polysaccharide-23 09/11/2003  . Tdap 02/04/2012  . Zoster 10/24/2013   Required Immunizations needed today  Screening test up to date or reviewed for plan of completion Health Maintenance Due  Topic Date Due  . DEXA SCAN  02/20/2001  . OPHTHALMOLOGY EXAM  10/03/2016  . HEMOGLOBIN A1C  10/22/2016         Objective:     Vitals: There were no vitals taken for this visit.  There is no height or weight on file to calculate BMI.   Tobacco History  Smoking Status  . Former Smoker  . Quit date: 02/07/1981  Smokeless Tobacco  . Never Used     Counseling given: Not Answered   Past Medical History:  Diagnosis Date  . Aortic stenosis   . CVA (cerebral infarction)   . Depression   . Diabetes mellitus   . GERD (gastroesophageal reflux disease)   . Hyperlipidemia   . Hypertension   . Osteoarthritis    Past Surgical History:  Procedure Laterality Date  . ABDOMINAL HYSTERECTOMY     fibroids, no cancer  . FRACTURE SURGERY     left leg  . JOINT REPLACEMENT     left and right knee  . ROTATOR CUFF REPAIR    . TEMPOROMANDIBULAR  JOINT SURGERY     Family History  Problem Relation Age of Onset  . Dementia Mother   . Hypertension Mother   . Heart attack Father   . Heart failure Sister    History  Sexual Activity  . Sexual activity: Not on file    Outpatient Encounter Prescriptions as of 03/06/2017  Medication Sig  . amLODipine (NORVASC) 5 MG tablet Take 1 tablet (5 mg total) by mouth daily.  Marland Kitchen aspirin 325 MG tablet Take 325 mg by mouth every morning.   . Aspirin-Salicylamide-Caffeine (BC HEADACHE PO) Take 1 packet by mouth daily as needed (headache).  Marland Kitchen atorvastatin (LIPITOR) 20 MG tablet TAKE 1  TABLET (20 MG TOTAL) BY MOUTH DAILY.  Marland Kitchen Cyanocobalamin (VITAMIN B 12 PO) Take 1,000 mcg by mouth every morning.   Marland Kitchen esomeprazole (NEXIUM) 40 MG capsule TAKE ONE CAPSULE BY MOUTH EVERY DAY  . esomeprazole (NEXIUM) 40 MG capsule TAKE ONE CAPSULE BY MOUTH EVERY DAY  . glucose blood (FREESTYLE TEST STRIPS) test strip 1 each by Other route daily. Use as instructed  . ibuprofen (ADVIL,MOTRIN) 200 MG tablet Take 400 mg by mouth every 6 (six) hours as needed for moderate pain (pain).  Marland Kitchen imipramine (TOFRANIL) 25 MG tablet TAKE 1 TABLET BY MOUTH EVERY DAY  . meloxicam (MOBIC) 7.5 MG tablet TAKE 1 TABLET (7.5 MG TOTAL) BY MOUTH DAILY.  . metFORMIN (GLUCOPHAGE) 500 MG tablet Take 2 tablets (1,000 mg total) by mouth 2 (two) times daily with a meal.  . Multiple Vitamin (MULTIVITAMIN) tablet Take 1 tablet by mouth daily.    . verapamil (VERELAN PM) 360 MG 24 hr capsule TAKE ONE CAPSULE BY MOUTH AT BEDTIME   No facility-administered encounter medications on file as of 03/06/2017.     Activities of Daily Living No flowsheet data found.  Patient Care Team: Marletta Lor, MD as PCP - General (Internal Medicine)    Assessment:    *** Exercise Activities and Dietary recommendations    Goals    None     Fall Risk Fall Risk  01/19/2017 11/17/2016 05/06/2015 11/13/2013  Falls in the past year? No No Yes No  Number falls in past yr: - - 2 or more -  Injury with Fall? - - Yes -  Risk Factor Category  - - High Fall Risk -  Risk for fall due to : - - Impaired balance/gait -   Depression Screen PHQ 2/9 Scores 01/19/2017 05/06/2015  PHQ - 2 Score 0 1     Cognitive Function        Immunization History  Administered Date(s) Administered  . Influenza Split 09/04/2011, 09/16/2012, 10/24/2013  . Influenza Whole 11/01/2007, 08/31/2009, 09/10/2010  . Influenza, High Dose Seasonal PF 10/07/2016  . Pneumococcal Conjugate-13 11/13/2013  . Pneumococcal Polysaccharide-23 09/11/2003  . Tdap 02/04/2012  .  Zoster 10/24/2013   Screening Tests Health Maintenance  Topic Date Due  . DEXA SCAN  02/20/2001  . OPHTHALMOLOGY EXAM  10/03/2016  . HEMOGLOBIN A1C  10/22/2016  . FOOT EXAM  04/21/2017  . URINE MICROALBUMIN  04/21/2017  . TETANUS/TDAP  02/03/2022  . INFLUENZA VACCINE  Completed  . PNA vac Low Risk Adult  Completed      Plan:   *** During the course of the visit the patient was educated and counseled about the following appropriate screening and preventive services:   Vaccines to include Pneumoccal, Influenza, Hepatitis B, Td, Zostavax, HCV  Electrocardiogram  Cardiovascular Disease  Colorectal cancer screening  Bone density screening  Diabetes screening  Glaucoma screening  Mammography/PAP  Nutrition counseling   Patient Instructions (the written plan) was given to the patient.   Wynetta Fines, RN  03/06/2017

## 2017-03-16 NOTE — Telephone Encounter (Signed)
Erroneous encounter

## 2017-03-29 ENCOUNTER — Other Ambulatory Visit: Payer: Self-pay | Admitting: Internal Medicine

## 2017-04-10 ENCOUNTER — Other Ambulatory Visit: Payer: Self-pay | Admitting: Internal Medicine

## 2017-04-14 ENCOUNTER — Other Ambulatory Visit: Payer: Self-pay | Admitting: Internal Medicine

## 2017-05-10 ENCOUNTER — Other Ambulatory Visit: Payer: Self-pay | Admitting: Internal Medicine

## 2017-05-11 ENCOUNTER — Telehealth: Payer: Self-pay | Admitting: *Deleted

## 2017-05-11 NOTE — Telephone Encounter (Signed)
[facility] [address] [facility]  verapamil (VERELAN PM) 360 MG 24 hr capsule is on backorder.  Pharmacy request change or send prescription elsewhere.  Suggested alternatives: Verapamil ER PM 300 mg Capsule?

## 2017-05-11 NOTE — Telephone Encounter (Signed)
Send new prescription elsewhere

## 2017-05-11 NOTE — Telephone Encounter (Signed)
Left message on machine for patient to return our call to find out which pharmacy he would like his refills done

## 2017-05-15 MED ORDER — VERAPAMIL HCL ER 360 MG PO CP24: 360.0000 mg | ORAL_CAPSULE | Freq: Every day | ORAL | 1 refills | Status: DC

## 2017-05-15 NOTE — Telephone Encounter (Signed)
[facility] to confirm if we were going to switch the verapamil (VERELAN PM) 360 MG 24 hr capsule Because it is backordered.  Advised pharmacy we would send somewhere else.  Spoke with Manuela Schwartz, daughter who is on the [facility]. She asked if we could send to [facility],  Address: [address], [place], [place], [place] [address]   Phone: [phone]   Manuela Schwartz would like to know if this is done.  She states pt does not hear very well.

## 2017-05-15 NOTE — Telephone Encounter (Signed)
Rx sent.  Please inform daughter.

## 2017-05-15 NOTE — Telephone Encounter (Signed)
LM ON VM the the rx was sent to [facility].

## 2017-05-16 ENCOUNTER — Other Ambulatory Visit: Payer: Self-pay

## 2017-05-16 NOTE — Telephone Encounter (Signed)
Spoke with pt and advised. She will have her daughter pick up medication. Nothing further needed at this time.

## 2017-05-17 ENCOUNTER — Other Ambulatory Visit: Payer: Self-pay | Admitting: Internal Medicine

## 2017-05-18 ENCOUNTER — Telehealth: Payer: Self-pay | Admitting: Internal Medicine

## 2017-05-18 MED ORDER — VERAPAMIL HCL ER 360 MG PO CP24: 360.0000 mg | ORAL_CAPSULE | Freq: Every day | ORAL | 1 refills | Status: DC

## 2017-05-18 NOTE — Telephone Encounter (Signed)
Pharm did not received verapamil 360 mg #90 [facility] [facility]

## 2017-05-18 NOTE — Telephone Encounter (Signed)
Rx sent 

## 2017-05-22 ENCOUNTER — Other Ambulatory Visit: Payer: Self-pay | Admitting: Internal Medicine

## 2017-07-17 ENCOUNTER — Other Ambulatory Visit: Payer: Self-pay | Admitting: Internal Medicine

## 2017-09-27 ENCOUNTER — Ambulatory Visit: Payer: Medicare Other | Admitting: Internal Medicine

## 2017-10-02 ENCOUNTER — Ambulatory Visit: Payer: Medicare Other | Admitting: Internal Medicine

## 2017-10-03 ENCOUNTER — Telehealth: Payer: Self-pay | Admitting: Internal Medicine

## 2017-10-03 NOTE — Telephone Encounter (Signed)
Daughter Marden Noble called asking if Dr Raliegh Ip would give her a call prior to pt's appt on Tuesday, 10/30? Family has some concerns and would like to speak with him prior to appt.  Daughter (both) are on the [facility]

## 2017-10-04 NOTE — Telephone Encounter (Signed)
Please advise 

## 2017-10-09 ENCOUNTER — Ambulatory Visit (INDEPENDENT_AMBULATORY_CARE_PROVIDER_SITE_OTHER): Payer: Medicare Other | Admitting: Internal Medicine

## 2017-10-09 ENCOUNTER — Encounter: Payer: Self-pay | Admitting: Internal Medicine

## 2017-10-09 VITALS — BP 126/80 | HR 131 | Ht 63.5 in | Wt 171.0 lb

## 2017-10-09 DIAGNOSIS — I359 Nonrheumatic aortic valve disorder, unspecified: Secondary | ICD-10-CM | POA: Diagnosis not present

## 2017-10-09 DIAGNOSIS — E0840 Diabetes mellitus due to underlying condition with diabetic neuropathy, unspecified: Secondary | ICD-10-CM | POA: Diagnosis not present

## 2017-10-09 DIAGNOSIS — E785 Hyperlipidemia, unspecified: Secondary | ICD-10-CM

## 2017-10-09 DIAGNOSIS — Z23 Encounter for immunization: Secondary | ICD-10-CM | POA: Diagnosis not present

## 2017-10-09 DIAGNOSIS — M159 Polyosteoarthritis, unspecified: Secondary | ICD-10-CM

## 2017-10-09 DIAGNOSIS — I10 Essential (primary) hypertension: Secondary | ICD-10-CM | POA: Diagnosis not present

## 2017-10-09 DIAGNOSIS — M150 Primary generalized (osteo)arthritis: Secondary | ICD-10-CM | POA: Diagnosis not present

## 2017-10-09 DIAGNOSIS — F339 Major depressive disorder, recurrent, unspecified: Secondary | ICD-10-CM

## 2017-10-09 MED ORDER — METFORMIN HCL 500 MG PO TABS: 500.0000 mg | ORAL_TABLET | Freq: Two times a day (BID) | ORAL | 3 refills | Status: DC

## 2017-10-09 NOTE — Progress Notes (Signed)
Subjective:    Patient ID: [name], female    DOB: 1934/06/20, 83 y.o.   MRN: [id number]  HPI  83 year old patient who has a history of diabetes and multiple other issues who is seen very infrequently. She states that  she is seen today because she was tricked  by her daughter to come in  for the exam today.  She canceled to prior follow-ups earlier this month. The patient complains of dizziness, chronic fatigue.  She does admit to multiple falls.  She also complains of occasional diarrhea.  Medical regimen includes full dose metformin  Apparently the patient has been taking both amlodipine and verapamil  Patient has a history of chronic depression and has been on imipramine chronically.  Patient states that she cries very easily when this medication is discontinued  Her daughter, Henderson Newcomer,  Brought a list of multiple concerns concerning her mother who did not wish her to be in the examining room.  There has been a progressive decline with multiple falls.  The patient has been very resistant to use medical assistive devices.  She no longer drives.  There has been some concern about confusion Her daughter complains of easy fatigability, dizziness and chronic fatigue She feels there is been a decline in her short-term memory She has concerns about discoloration of her feet and hands which has been chronic.  When the patient falls.  She requires 2 person assistance to stand up.  She does have a prior history of cerebrovascular disease  MMSE 27 out of 30  Past Medical History:  Diagnosis Date  . Aortic stenosis   . CVA (cerebral infarction)   . Depression   . Diabetes mellitus   . GERD (gastroesophageal reflux disease)   . Hyperlipidemia   . Hypertension   . Osteoarthritis      Social History   Social History  . Marital status: Widowed    Spouse name: N/A  . Number of children: N/A  . Years of education: N/A   Occupational History  . Not on file.   Social History  Main Topics  . Smoking status: Former Smoker    Quit date: 02/07/1981  . Smokeless tobacco: Never Used  . Alcohol use No  . Drug use: No  . Sexual activity: Not on file   Other Topics Concern  . Not on file   Social History Narrative  . No narrative on file    Past Surgical History:  Procedure Laterality Date  . ABDOMINAL HYSTERECTOMY     fibroids, no cancer  . FRACTURE SURGERY     left leg  . JOINT REPLACEMENT     left and right knee  . ROTATOR CUFF REPAIR    . TEMPOROMANDIBULAR JOINT SURGERY      Family History  Problem Relation Age of Onset  . Dementia Mother   . Hypertension Mother   . Heart attack Father   . Heart failure Sister     Allergies  Allergen Reactions  . Cymbalta [Duloxetine Hcl] Anaphylaxis  . Hydrochlorothiazide     REACTION: rash  . Oxycodone Other (See Comments)    Nightmares  . Ramipril     REACTION: difficulty breathing  . Telmisartan-Hctz     REACTION: rash    Current Outpatient Prescriptions on File Prior to Visit  Medication Sig Dispense Refill  . aspirin 325 MG tablet Take 325 mg by mouth every morning.     Marland Kitchen atorvastatin (LIPITOR) 20 MG tablet TAKE 1 TABLET (  20 MG TOTAL) BY MOUTH DAILY. 90 tablet 1  . Cyanocobalamin (VITAMIN B 12 PO) Take 1,000 mcg by mouth every morning.     Marland Kitchen esomeprazole (NEXIUM) 40 MG capsule TAKE ONE CAPSULE BY MOUTH EVERY DAY 30 capsule 0  . glucose blood (FREESTYLE TEST STRIPS) test strip 1 each by Other route daily. Use as instructed 100 each 12  . ibuprofen (ADVIL,MOTRIN) 200 MG tablet Take 400 mg by mouth every 6 (six) hours as needed for moderate pain (pain).    Marland Kitchen imipramine (TOFRANIL) 25 MG tablet TAKE 1 TABLET BY MOUTH EVERY DAY 90 tablet 1  . Multiple Vitamin (MULTIVITAMIN) tablet Take 1 tablet by mouth daily.       No current facility-administered medications on file prior to visit.     BP 126/80   Pulse (!) 131   Ht 5' 3.5" (1.613 m)   Wt 171 lb (77.6 kg)   SpO2 97%   BMI 29.82 kg/m      Review of Systems  Constitutional: Positive for activity change and fatigue. Negative for appetite change, fever and unexpected weight change.  HENT: Negative for congestion, dental problem, ear pain, hearing loss, mouth sores, nosebleeds, sinus pressure, sore throat, tinnitus, trouble swallowing and voice change.   Eyes: Negative for photophobia, pain, redness and visual disturbance.  Respiratory: Negative for cough, chest tightness and shortness of breath.   Cardiovascular: Negative for chest pain, palpitations and leg swelling.  Gastrointestinal: Negative for abdominal distention, abdominal pain, blood in stool, constipation, diarrhea, nausea, rectal pain and vomiting.  Genitourinary: Negative for difficulty urinating, dysuria, flank pain, frequency, genital sores, hematuria, menstrual problem, pelvic pain, urgency, vaginal bleeding, vaginal discharge and vaginal pain.  Musculoskeletal: Positive for arthralgias and gait problem. Negative for back pain and neck stiffness.  Skin: Negative for rash.  Neurological: Positive for dizziness, weakness, light-headedness and headaches. Negative for syncope, speech difficulty and numbness.  Hematological: Negative for adenopathy. Does not bruise/bleed easily.  Psychiatric/Behavioral: Negative for agitation, behavioral problems, dysphoric mood, self-injury and suicidal ideas. The patient is not nervous/anxious.        Objective:   Physical Exam  Constitutional: She is oriented to person, place, and time. She appears well-developed and well-nourished. No distress.  Patient is unable to stand from a sitting position Requires assistance to transfer from a chair to the examining table  Blood pressure as low as 80 over 60 with resting tachycardia  HENT:  Head: Normocephalic and atraumatic.  Right Ear: External ear normal.  Left Ear: External ear normal.  Mouth/Throat: Oropharynx is clear and moist.  Eyes: Conjunctivae and EOM are normal.   Neck: Normal range of motion. Neck supple. No JVD present. No thyromegaly present.  Cardiovascular: Normal rate and intact distal pulses.   Murmur heard. Resting tachycardia Grade 2/6 systolic murmur  Pulmonary/Chest: Effort normal and breath sounds normal. She has no wheezes. She has no rales.  Abdominal: Soft. Bowel sounds are normal. She exhibits no distension and no mass. There is no tenderness. There is no rebound and no guarding.  Genitourinary: Vagina normal.  Musculoskeletal: Normal range of motion. She exhibits no edema or tenderness.  Neurological: She is alert and oriented to person, place, and time. She has normal reflexes. No cranial nerve deficit. She exhibits normal muscle tone. Coordination normal.  Skin: Skin is warm and dry. No rash noted.  Hands: Slightly cyanotic  Psychiatric: She has a normal mood and affect. Her behavior is normal.  Assessment & Plan:   Hypotension.  Patient has been on 2 calcium channel blockers.  Both will be discontinued Diabetes mellitus.  Will review a hemoglobin A1c Diarrhea.  Decrease metformin to 500 mg twice daily History of hypertension.  Blood pressure medications will be held due to hypotension General debility.  Multifactorial.  Check screening lab.  Consider referral for physical therapy  Follow up 2 weeks  Nyoka Cowden

## 2017-10-09 NOTE — Patient Instructions (Signed)
Discontinue amlodipine and verapamil  Decrease metformin to 1 tablet twice daily  Return in 2 weeks for follow-up  Limit your sodium (Salt) intake  Please check your blood pressure on a regular basis.  If it is consistently greater than 150/90, please make an office appointment.    It is important that you exercise regularly, at least 20 minutes 3 to 4 times per week.  If you develop chest pain or shortness of breath seek  medical attention.  Consider referral for physical therapy to improve your balance, weakness and general conditioning

## 2017-10-10 LAB — COMPREHENSIVE METABOLIC PANEL
ALK PHOS: 62 U/L (ref 39–117)
ALT: 8 U/L (ref 0–35)
AST: 13 U/L (ref 0–37)
Albumin: 3.9 g/dL (ref 3.5–5.2)
BUN: 13 mg/dL (ref 6–23)
CALCIUM: 9.4 mg/dL (ref 8.4–10.5)
CO2: 23 mEq/L (ref 19–32)
CREATININE: 1.12 mg/dL (ref 0.40–1.20)
Chloride: 101 mEq/L (ref 96–112)
GFR: 49.55 mL/min — AB (ref >60.00)
GLUCOSE: 206 mg/dL — AB (ref 70–99)
Potassium: 4 mEq/L (ref 3.5–5.1)
Sodium: 139 mEq/L (ref 135–145)
TOTAL PROTEIN: 6.5 g/dL (ref 6.0–8.3)
Total Bilirubin: 0.5 mg/dL (ref 0.2–1.2)

## 2017-10-10 LAB — CBC WITH DIFFERENTIAL/PLATELET
BASOS ABS: 0.1 10*3/uL (ref 0.0–0.1)
Basophils Relative: 1.1 % (ref 0.0–3.0)
EOS ABS: 0 10*3/uL (ref 0.0–0.7)
Eosinophils Relative: 0.4 % (ref 0.0–5.0)
HEMATOCRIT: 43.3 % (ref 36.0–46.0)
Hemoglobin: 14 g/dL (ref 12.0–15.0)
LYMPHS PCT: 15.4 % (ref 12.0–46.0)
Lymphs Abs: 1.3 10*3/uL (ref 0.7–4.0)
MCHC: 32.3 g/dL (ref 30.0–36.0)
MCV: 93 fl (ref 78.0–100.0)
MONO ABS: 0.6 10*3/uL (ref 0.1–1.0)
Monocytes Relative: 7.1 % (ref 3.0–12.0)
NEUTROS ABS: 6.5 10*3/uL (ref 1.4–7.7)
NEUTROS PCT: 76 % (ref 43.0–77.0)
PLATELETS: 244 10*3/uL (ref 150.0–400.0)
RBC: 4.66 Mil/uL (ref 3.87–5.11)
RDW: 13.4 % (ref 11.5–15.5)
WBC: 8.5 10*3/uL (ref 4.0–10.5)

## 2017-10-10 LAB — LIPID PANEL
Cholesterol: 108 mg/dL (ref 0–200)
HDL: 48.2 mg/dL (ref >39.00)
LDL Cholesterol: 32 mg/dL (ref 0–99)
NonHDL: 60.22
TRIGLYCERIDES: 140 mg/dL (ref 0.0–149.0)
Total CHOL/HDL Ratio: 2
VLDL: 28 mg/dL (ref 0.0–40.0)

## 2017-10-10 LAB — TSH: TSH: 1.92 u[IU]/mL (ref 0.35–4.50)

## 2017-10-10 LAB — HEMOGLOBIN A1C: Hgb A1c MFr Bld: 7.6 % — ABNORMAL HIGH (ref 4.6–6.5)

## 2017-10-10 MED ORDER — GLIPIZIDE ER 2.5 MG PO TB24: 2.5000 mg | ORAL_TABLET | Freq: Every day | ORAL | 0 refills | Status: DC

## 2017-10-10 NOTE — Addendum Note (Signed)
Addended by: Dorrene German on: 10/10/2017 02:49 PM   Modules accepted: Orders

## 2017-10-12 NOTE — Addendum Note (Signed)
Addended by: Tomi Likens on: 10/12/2017 10:34 AM   Modules accepted: Orders

## 2017-10-16 ENCOUNTER — Encounter: Payer: Medicare Other | Admitting: Internal Medicine

## 2017-10-16 ENCOUNTER — Other Ambulatory Visit: Payer: Self-pay

## 2017-10-16 ENCOUNTER — Ambulatory Visit (INDEPENDENT_AMBULATORY_CARE_PROVIDER_SITE_OTHER): Payer: Medicare Other | Admitting: Internal Medicine

## 2017-10-16 ENCOUNTER — Encounter: Payer: Self-pay | Admitting: Internal Medicine

## 2017-10-16 ENCOUNTER — Other Ambulatory Visit: Payer: Self-pay | Admitting: Internal Medicine

## 2017-10-16 VITALS — BP 102/68 | HR 124 | Temp 97.7°F | Ht 63.5 in | Wt 169.2 lb

## 2017-10-16 DIAGNOSIS — I359 Nonrheumatic aortic valve disorder, unspecified: Secondary | ICD-10-CM | POA: Diagnosis not present

## 2017-10-16 DIAGNOSIS — R0602 Shortness of breath: Secondary | ICD-10-CM | POA: Diagnosis not present

## 2017-10-16 DIAGNOSIS — I9589 Other hypotension: Secondary | ICD-10-CM

## 2017-10-16 DIAGNOSIS — R000 Tachycardia, unspecified: Secondary | ICD-10-CM | POA: Diagnosis not present

## 2017-10-16 DIAGNOSIS — R7989 Other specified abnormal findings of blood chemistry: Secondary | ICD-10-CM | POA: Diagnosis not present

## 2017-10-16 DIAGNOSIS — I10 Essential (primary) hypertension: Secondary | ICD-10-CM | POA: Diagnosis not present

## 2017-10-16 DIAGNOSIS — F339 Major depressive disorder, recurrent, unspecified: Secondary | ICD-10-CM | POA: Diagnosis not present

## 2017-10-16 DIAGNOSIS — E0840 Diabetes mellitus due to underlying condition with diabetic neuropathy, unspecified: Secondary | ICD-10-CM | POA: Diagnosis not present

## 2017-10-16 MED ORDER — METFORMIN HCL 500 MG PO TABS: 500.0000 mg | ORAL_TABLET | Freq: Every day | ORAL | 3 refills | Status: AC

## 2017-10-16 MED ORDER — ACCU-CHEK FASTCLIX LANCETS MISC: 3 refills | Status: DC

## 2017-10-16 MED ORDER — GLUCOSE BLOOD VI STRP: 1.0000 | ORAL_STRIP | Freq: Every day | 3 refills | Status: DC

## 2017-10-16 MED ORDER — FLUDROCORTISONE ACETATE 0.1 MG PO TABS: 0.1000 mg | ORAL_TABLET | Freq: Every day | ORAL | 3 refills | Status: DC

## 2017-10-16 MED ORDER — GLUCOSE BLOOD VI STRP: ORAL_STRIP | 3 refills | Status: DC

## 2017-10-16 NOTE — Patient Instructions (Signed)
Do not restrict your salt intake Make sure you have discontinued both verapamil and amlodipine  Florinef 1 tablet daily  Decrease metformin to 1 pill daily  Obtain a walker to improve your ability to ambulate and decrease your fall risk

## 2017-10-16 NOTE — Progress Notes (Addendum)
Subjective:    Patient ID: [name], female    DOB: 12/14/1933, 83 y.o.   MRN: [id number]  HPI  83 year old patient who was seen 1 week ago after a prolonged absence.  At that time she was noted to be quite weak deconditioned and hypotensive.  She apparently was on a combination of both amlodipine and verapamil which was discontinued.  Laboratory screen revealed a slightly elevated blood sugar but otherwise noncontributory. Due to loose and occasionally incontinent stools, metformin therapy was decreased to 500 mg twice daily.  The patient generally feels improved but still weak.  She plans on obtaining a 4 pronged walking cane. Social history.  Resides with her daughter and son-in-law   Wt Readings from Last 3 Encounters:  10/16/17 169 lb 3.2 oz (76.7 kg)  10/09/17 171 lb (77.6 kg)  01/19/17 188 lb 3.2 oz (85.4 kg)    Past Medical History:  Diagnosis Date  . Aortic stenosis   . CVA (cerebral infarction)   . Depression   . Diabetes mellitus   . GERD (gastroesophageal reflux disease)   . Hyperlipidemia   . Hypertension   . Osteoarthritis      Social History   Socioeconomic History  . Marital status: Widowed    Spouse name: Not on file  . Number of children: Not on file  . Years of education: Not on file  . Highest education level: Not on file  Social Needs  . Financial resource strain: Not on file  . Food insecurity - worry: Not on file  . Food insecurity - inability: Not on file  . Transportation needs - medical: Not on file  . Transportation needs - non-medical: Not on file  Occupational History  . Not on file  Tobacco Use  . Smoking status: Former Smoker    Last attempt to quit: 02/07/1981    Years since quitting: 36.7  . Smokeless tobacco: Never Used  Substance and Sexual Activity  . Alcohol use: No  . Drug use: No  . Sexual activity: Not on file  Other Topics Concern  . Not on file  Social History Narrative  . Not on file    Past Surgical  History:  Procedure Laterality Date  . ABDOMINAL HYSTERECTOMY     fibroids, no cancer  . FRACTURE SURGERY     left leg  . JOINT REPLACEMENT     left and right knee  . ROTATOR CUFF REPAIR    . TEMPOROMANDIBULAR JOINT SURGERY      Family History  Problem Relation Age of Onset  . Dementia Mother   . Hypertension Mother   . Heart attack Father   . Heart failure Sister     Allergies  Allergen Reactions  . Cymbalta [Duloxetine Hcl] Anaphylaxis  . Hydrochlorothiazide     REACTION: rash  . Oxycodone Other (See Comments)    Nightmares  . Ramipril     REACTION: difficulty breathing  . Telmisartan-Hctz     REACTION: rash    Current Outpatient Medications on File Prior to Visit  Medication Sig Dispense Refill  . aspirin 325 MG tablet Take 325 mg by mouth every morning.     Marland Kitchen atorvastatin (LIPITOR) 20 MG tablet TAKE 1 TABLET (20 MG TOTAL) BY MOUTH DAILY. 90 tablet 1  . Cyanocobalamin (VITAMIN B 12 PO) Take 1,000 mcg by mouth every morning.     Marland Kitchen esomeprazole (NEXIUM) 40 MG capsule TAKE ONE CAPSULE BY MOUTH EVERY DAY 30 capsule 0  .  glipiZIDE (GLUCOTROL XL) 2.5 MG 24 hr tablet Take 1 tablet (2.5 mg total) by mouth daily with breakfast. 60 tablet 0  . ibuprofen (ADVIL,MOTRIN) 200 MG tablet Take 400 mg by mouth every 6 (six) hours as needed for moderate pain (pain).    Marland Kitchen imipramine (TOFRANIL) 25 MG tablet TAKE 1 TABLET BY MOUTH EVERY DAY 90 tablet 1  . Multiple Vitamin (MULTIVITAMIN) tablet Take 1 tablet by mouth daily.       No current facility-administered medications on file prior to visit.     BP 102/68 (BP Location: Left Arm, Patient Position: Sitting, Cuff Size: Normal)   Pulse (!) 124   Temp 97.7 F (36.5 C) (Oral)   Ht 5' 3.5" (1.613 m)   Wt 169 lb 3.2 oz (76.7 kg)   SpO2 100%   BMI 29.50 kg/m       Review of Systems  Constitutional: Positive for activity change and fatigue.  HENT: Negative for congestion, dental problem, hearing loss, rhinorrhea, sinus  pressure, sore throat and tinnitus.   Eyes: Negative for pain, discharge and visual disturbance.  Respiratory: Negative for cough and shortness of breath.   Cardiovascular: Negative for chest pain, palpitations and leg swelling.  Gastrointestinal: Negative for abdominal distention, abdominal pain, blood in stool, constipation, diarrhea, nausea and vomiting.  Genitourinary: Negative for difficulty urinating, dysuria, flank pain, frequency, hematuria, pelvic pain, urgency, vaginal bleeding, vaginal discharge and vaginal pain.  Musculoskeletal: Negative for arthralgias, gait problem and joint swelling.  Skin: Negative for rash.  Neurological: Positive for dizziness, weakness and light-headedness. Negative for syncope, speech difficulty, numbness and headaches.  Hematological: Negative for adenopathy.  Psychiatric/Behavioral: Negative for agitation, behavioral problems and dysphoric mood. The patient is not nervous/anxious.        Objective:   Physical Exam  Constitutional: She is oriented to person, place, and time. She appears well-developed and well-nourished. No distress.  Weak but in no distress Unable to stand from a sitting position without assistance Blood pressure is still 90/60 O2 saturation 100% Pulse rate 100-120 after ambulating to the examining room  HENT:  Head: Normocephalic.  Right Ear: External ear normal.  Left Ear: External ear normal.  Mouth/Throat: Oropharynx is clear and moist.  Eyes: Conjunctivae and EOM are normal. Pupils are equal, round, and reactive to light.  Neck: Normal range of motion. Neck supple. No thyromegaly present.  Cardiovascular: Normal rate, regular rhythm, normal heart sounds and intact distal pulses.  Resting tachycardia Grade 2/6 systolic murmur at the primary aortic area  Pulmonary/Chest: Effort normal and breath sounds normal.  Abdominal: Soft. Bowel sounds are normal. She exhibits no mass. There is no tenderness.  Genitourinary: Rectal  exam shows guaiac negative stool.  Musculoskeletal: Normal range of motion.  Status post bilateral total knee replacement surgeries  Lymphadenopathy:    She has no cervical adenopathy.  Neurological: She is alert and oriented to person, place, and time.  Decreased vibratory sensation of the feet Heel to shin testing normal although slow and deliberate  Skin: Skin is warm and dry. No rash noted.  Psychiatric: She has a normal mood and affect. Her behavior is normal.          Assessment & Plan:   History of essential hypertension.  Patient presently remains hypotensive with tachycardia.  Patient told to confirm she no longer is taking any antihypertensive medication.  Liberal fluid and salt intake encouraged.  Will check T4 cortisol level and d-dimer. Systolic murmur.  Check 2D echocardiogram  Florinef  0.1 mg daily Follow-up 2 weeks or as needed  Nyoka Cowden

## 2017-10-17 ENCOUNTER — Other Ambulatory Visit: Payer: Self-pay

## 2017-10-17 DIAGNOSIS — R0602 Shortness of breath: Secondary | ICD-10-CM

## 2017-10-17 LAB — CORTISOL: CORTISOL PLASMA: 16.5 ug/dL

## 2017-10-17 LAB — CK: CK TOTAL: 40 U/L (ref 7–177)

## 2017-10-17 LAB — T4, FREE: Free T4: 0.96 ng/dL (ref 0.60–1.60)

## 2017-10-17 LAB — D-DIMER, QUANTITATIVE: D-Dimer, Quant: 1.59 mcg/mL FEU — ABNORMAL HIGH (ref <0.50)

## 2017-10-17 NOTE — Addendum Note (Signed)
Addended by: Abelardo Diesel on: 10/17/2017 01:39 PM   Modules accepted: Orders

## 2017-10-18 ENCOUNTER — Other Ambulatory Visit: Payer: Self-pay

## 2017-10-18 ENCOUNTER — Inpatient Hospital Stay (HOSPITAL_COMMUNITY): Payer: Medicare Other

## 2017-10-18 ENCOUNTER — Telehealth: Payer: Self-pay

## 2017-10-18 ENCOUNTER — Ambulatory Visit (INDEPENDENT_AMBULATORY_CARE_PROVIDER_SITE_OTHER)
Admission: RE | Admit: 2017-10-18 | Discharge: 2017-10-18 | Disposition: A | Discharge status: Home / Self Care | Payer: Medicare Other | Source: Ambulatory Visit | Attending: Internal Medicine | Admitting: Internal Medicine

## 2017-10-18 ENCOUNTER — Inpatient Hospital Stay (HOSPITAL_COMMUNITY)
Admission: EM | Admit: 2017-10-18 | Discharge: 2017-10-20 | DRG: 176 | Disposition: A | Discharge status: Home / Self Care | Payer: Medicare Other | Attending: Family Medicine | Admitting: Family Medicine

## 2017-10-18 ENCOUNTER — Other Ambulatory Visit: Payer: Medicare Other

## 2017-10-18 ENCOUNTER — Telehealth: Payer: Self-pay | Admitting: *Deleted

## 2017-10-18 ENCOUNTER — Encounter (HOSPITAL_COMMUNITY): Payer: Self-pay

## 2017-10-18 DIAGNOSIS — Z79899 Other long term (current) drug therapy: Secondary | ICD-10-CM | POA: Diagnosis not present

## 2017-10-18 DIAGNOSIS — Z96653 Presence of artificial knee joint, bilateral: Secondary | ICD-10-CM | POA: Diagnosis present

## 2017-10-18 DIAGNOSIS — Z951 Presence of aortocoronary bypass graft: Secondary | ICD-10-CM

## 2017-10-18 DIAGNOSIS — Z888 Allergy status to other drugs, medicaments and biological substances status: Secondary | ICD-10-CM | POA: Diagnosis not present

## 2017-10-18 DIAGNOSIS — K219 Gastro-esophageal reflux disease without esophagitis: Secondary | ICD-10-CM | POA: Diagnosis present

## 2017-10-18 DIAGNOSIS — I824Z2 Acute embolism and thrombosis of unspecified deep veins of left distal lower extremity: Secondary | ICD-10-CM | POA: Diagnosis present

## 2017-10-18 DIAGNOSIS — F329 Major depressive disorder, single episode, unspecified: Secondary | ICD-10-CM | POA: Diagnosis present

## 2017-10-18 DIAGNOSIS — I2699 Other pulmonary embolism without acute cor pulmonale: Secondary | ICD-10-CM | POA: Diagnosis not present

## 2017-10-18 DIAGNOSIS — Z7982 Long term (current) use of aspirin: Secondary | ICD-10-CM | POA: Diagnosis not present

## 2017-10-18 DIAGNOSIS — Z66 Do not resuscitate: Secondary | ICD-10-CM | POA: Diagnosis present

## 2017-10-18 DIAGNOSIS — Z87891 Personal history of nicotine dependence: Secondary | ICD-10-CM

## 2017-10-18 DIAGNOSIS — Z8673 Personal history of transient ischemic attack (TIA), and cerebral infarction without residual deficits: Secondary | ICD-10-CM | POA: Diagnosis not present

## 2017-10-18 DIAGNOSIS — E785 Hyperlipidemia, unspecified: Secondary | ICD-10-CM | POA: Diagnosis present

## 2017-10-18 DIAGNOSIS — Z885 Allergy status to narcotic agent status: Secondary | ICD-10-CM | POA: Diagnosis not present

## 2017-10-18 DIAGNOSIS — R0602 Shortness of breath: Secondary | ICD-10-CM

## 2017-10-18 DIAGNOSIS — Z7984 Long term (current) use of oral hypoglycemic drugs: Secondary | ICD-10-CM

## 2017-10-18 DIAGNOSIS — I700 Atherosclerosis of aorta: Secondary | ICD-10-CM | POA: Diagnosis present

## 2017-10-18 DIAGNOSIS — E1142 Type 2 diabetes mellitus with diabetic polyneuropathy: Secondary | ICD-10-CM | POA: Diagnosis present

## 2017-10-18 DIAGNOSIS — I2510 Atherosclerotic heart disease of native coronary artery without angina pectoris: Secondary | ICD-10-CM | POA: Diagnosis present

## 2017-10-18 DIAGNOSIS — E1140 Type 2 diabetes mellitus with diabetic neuropathy, unspecified: Secondary | ICD-10-CM | POA: Diagnosis present

## 2017-10-18 DIAGNOSIS — I10 Essential (primary) hypertension: Secondary | ICD-10-CM | POA: Diagnosis present

## 2017-10-18 LAB — ECHOCARDIOGRAM COMPLETE
Height: 63 in
WEIGHTICAEL: 2560 [oz_av]

## 2017-10-18 LAB — CBC
HCT: 40.6 % (ref 36.0–46.0)
Hemoglobin: 13.4 g/dL (ref 12.0–15.0)
MCH: 29.8 pg (ref 26.0–34.0)
MCHC: 33 g/dL (ref 30.0–36.0)
MCV: 90.4 fL (ref 78.0–100.0)
PLATELETS: 188 10*3/uL (ref 150–400)
RBC: 4.49 MIL/uL (ref 3.87–5.11)
RDW: 13.2 % (ref 11.5–15.5)
WBC: 6.1 10*3/uL (ref 4.0–10.5)

## 2017-10-18 LAB — BASIC METABOLIC PANEL
Anion gap: 9 (ref 5–15)
BUN: 16 mg/dL (ref 6–20)
CALCIUM: 8.5 mg/dL — AB (ref 8.9–10.3)
CO2: 21 mmol/L — AB (ref 22–32)
CREATININE: 1.2 mg/dL — AB (ref 0.44–1.00)
Chloride: 103 mmol/L (ref 101–111)
GFR calc Af Amer: 48 mL/min — ABNORMAL LOW (ref >60)
GFR calc non Af Amer: 41 mL/min — ABNORMAL LOW (ref >60)
GLUCOSE: 227 mg/dL — AB (ref 65–99)
Potassium: 5.5 mmol/L — ABNORMAL HIGH (ref 3.5–5.1)
Sodium: 133 mmol/L — ABNORMAL LOW (ref 135–145)

## 2017-10-18 LAB — PROTIME-INR
INR: 0.92
Prothrombin Time: 12.3 seconds (ref 11.4–15.2)

## 2017-10-18 LAB — GLUCOSE, CAPILLARY
GLUCOSE-CAPILLARY: 118 mg/dL — AB (ref 65–99)
GLUCOSE-CAPILLARY: 154 mg/dL — AB (ref 65–99)

## 2017-10-18 LAB — I-STAT TROPONIN, ED: TROPONIN I, POC: 0.04 ng/mL (ref 0.00–0.08)

## 2017-10-18 MED ORDER — ACETAMINOPHEN 650 MG RE SUPP: 650.0000 mg | Freq: Four times a day (QID) | RECTAL | Status: DC | PRN

## 2017-10-18 MED ORDER — APIXABAN 5 MG PO TABS: 10.0000 mg | ORAL_TABLET | Freq: Once | ORAL | Status: DC

## 2017-10-18 MED ORDER — ONDANSETRON HCL 4 MG PO TABS: 4.0000 mg | ORAL_TABLET | Freq: Four times a day (QID) | ORAL | Status: DC | PRN

## 2017-10-18 MED ORDER — IMIPRAMINE HCL 25 MG PO TABS
25.0000 mg | ORAL_TABLET | Freq: Every day | ORAL | Status: DC
  Administered 2017-10-19 – 2017-10-20 (×2): 25 mg via ORAL
  Filled 2017-10-18 (×2): qty 1

## 2017-10-18 MED ORDER — ONE-DAILY MULTI VITAMINS PO TABS: 1.0000 | ORAL_TABLET | Freq: Every day | ORAL | Status: DC

## 2017-10-18 MED ORDER — APIXABAN 5 MG PO TABS: 5.0000 mg | ORAL_TABLET | Freq: Two times a day (BID) | ORAL | Status: DC

## 2017-10-18 MED ORDER — ALBUTEROL SULFATE (2.5 MG/3ML) 0.083% IN NEBU: 2.5000 mg | INHALATION_SOLUTION | RESPIRATORY_TRACT | Status: DC | PRN

## 2017-10-18 MED ORDER — FLUDROCORTISONE ACETATE 0.1 MG PO TABS
0.1000 mg | ORAL_TABLET | Freq: Every day | ORAL | Status: DC
  Administered 2017-10-19 – 2017-10-20 (×2): 0.1 mg via ORAL
  Filled 2017-10-18 (×2): qty 1

## 2017-10-18 MED ORDER — ASPIRIN 325 MG PO TABS: 325.0000 mg | ORAL_TABLET | Freq: Every morning | ORAL | Status: DC

## 2017-10-18 MED ORDER — ACETAMINOPHEN 325 MG PO TABS
650.0000 mg | ORAL_TABLET | Freq: Four times a day (QID) | ORAL | Status: DC | PRN
  Administered 2017-10-19 – 2017-10-20 (×2): 650 mg via ORAL
  Filled 2017-10-18 (×2): qty 2

## 2017-10-18 MED ORDER — ONDANSETRON HCL 4 MG/2ML IJ SOLN: 4.0000 mg | Freq: Four times a day (QID) | INTRAMUSCULAR | Status: DC | PRN

## 2017-10-18 MED ORDER — TRAZODONE HCL 50 MG PO TABS
50.0000 mg | ORAL_TABLET | Freq: Every evening | ORAL | Status: DC | PRN
  Administered 2017-10-19: 50 mg via ORAL
  Filled 2017-10-18: qty 1

## 2017-10-18 MED ORDER — SENNA 8.6 MG PO TABS
1.0000 | ORAL_TABLET | Freq: Two times a day (BID) | ORAL | Status: DC
  Administered 2017-10-20: 8.6 mg via ORAL
  Filled 2017-10-18 (×5): qty 1

## 2017-10-18 MED ORDER — SODIUM CHLORIDE 0.9% FLUSH
3.0000 mL | Freq: Two times a day (BID) | INTRAVENOUS | Status: DC
  Administered 2017-10-18 – 2017-10-20 (×5): 3 mL via INTRAVENOUS

## 2017-10-18 MED ORDER — SODIUM CHLORIDE 0.9 % IV SOLN: 250.0000 mL | INTRAVENOUS | Status: DC | PRN

## 2017-10-18 MED ORDER — SODIUM CHLORIDE 0.9% FLUSH: 3.0000 mL | INTRAVENOUS | Status: DC | PRN

## 2017-10-18 MED ORDER — ATORVASTATIN CALCIUM 20 MG PO TABS
20.0000 mg | ORAL_TABLET | Freq: Every day | ORAL | Status: DC
  Administered 2017-10-19: 20 mg via ORAL
  Filled 2017-10-18: qty 1

## 2017-10-18 MED ORDER — VITAMIN B-12 1000 MCG PO TABS
1000.0000 ug | ORAL_TABLET | Freq: Every morning | ORAL | Status: DC
  Administered 2017-10-19 – 2017-10-20 (×2): 1000 ug via ORAL
  Filled 2017-10-18 (×2): qty 1

## 2017-10-18 MED ORDER — POLYETHYLENE GLYCOL 3350 17 G PO PACK: 17.0000 g | PACK | Freq: Every day | ORAL | Status: DC | PRN

## 2017-10-18 MED ORDER — IOPAMIDOL (ISOVUE-370) INJECTION 76%
80.0000 mL | Freq: Once | INTRAVENOUS | Status: AC | PRN
  Administered 2017-10-18: 80 mL via INTRAVENOUS

## 2017-10-18 MED ORDER — PANTOPRAZOLE SODIUM 40 MG PO TBEC
40.0000 mg | DELAYED_RELEASE_TABLET | Freq: Every day | ORAL | Status: DC
  Administered 2017-10-19 – 2017-10-20 (×2): 40 mg via ORAL
  Filled 2017-10-18 (×2): qty 1

## 2017-10-18 MED ORDER — INSULIN ASPART 100 UNIT/ML ~~LOC~~ SOLN
0.0000 [IU] | Freq: Three times a day (TID) | SUBCUTANEOUS | Status: DC
  Administered 2017-10-19: 1 [IU] via SUBCUTANEOUS
  Administered 2017-10-19: 2 [IU] via SUBCUTANEOUS
  Administered 2017-10-19: 1 [IU] via SUBCUTANEOUS
  Administered 2017-10-20: 5 [IU] via SUBCUTANEOUS
  Administered 2017-10-20: 1 [IU] via SUBCUTANEOUS

## 2017-10-18 MED ORDER — SODIUM POLYSTYRENE SULFONATE 15 GM/60ML PO SUSP
30.0000 g | Freq: Once | ORAL | Status: AC
  Administered 2017-10-18: 30 g via ORAL
  Filled 2017-10-18: qty 120

## 2017-10-18 MED ORDER — APIXABAN 5 MG PO TABS
10.0000 mg | ORAL_TABLET | Freq: Two times a day (BID) | ORAL | Status: DC
  Administered 2017-10-18 – 2017-10-20 (×5): 10 mg via ORAL
  Filled 2017-10-18 (×6): qty 2

## 2017-10-18 MED ORDER — GLIPIZIDE ER 2.5 MG PO TB24
2.5000 mg | ORAL_TABLET | Freq: Every day | ORAL | Status: DC
  Administered 2017-10-19 – 2017-10-20 (×2): 2.5 mg via ORAL
  Filled 2017-10-18 (×2): qty 1

## 2017-10-18 NOTE — Discharge Instructions (Signed)
Information on my medicine - ELIQUIS (apixaban)  This medication education was reviewed with me or my healthcare representative as part of my discharge preparation.    Why was Eliquis prescribed for you? Eliquis was prescribed to treat blood clots that may have been found in the veins of your legs (deep vein thrombosis) or in your lungs (pulmonary embolism) and to reduce the risk of them occurring again.  What do You need to know about Eliquis ? The starting dose is 10 mg (two 5 mg tablets) taken TWICE daily for the FIRST SEVEN (7) DAYS, then on 10/26/17 the dose is reduced to ONE 5 mg tablet taken TWICE daily.  Eliquis may be taken with or without food.   Try to take the dose about the same time in the morning and in the evening. If you have difficulty swallowing the tablet whole please discuss with your pharmacist how to take the medication safely.  Take Eliquis exactly as prescribed and DO NOT stop taking Eliquis without talking to the doctor who prescribed the medication.  Stopping may increase your risk of developing a new blood clot.  Refill your prescription before you run out.  After discharge, you should have regular check-up appointments with your healthcare provider that is prescribing your Eliquis.    What do you do if you miss a dose? If a dose of ELIQUIS is not taken at the scheduled time, take it as soon as possible on the same day and twice-daily administration should be resumed. The dose should not be doubled to make up for a missed dose.  Important Safety Information A possible side effect of Eliquis is bleeding. You should call your healthcare provider right away if you experience any of the following: ? Bleeding from an injury or your nose that does not stop. ? Unusual colored urine (red or dark brown) or unusual colored stools (red or black). ? Unusual bruising for unknown reasons. ? A serious fall or if you hit your head (even if there is no bleeding).  Some  medicines may interact with Eliquis and might increase your risk of bleeding or clotting while on Eliquis. To help avoid this, consult your healthcare provider or pharmacist prior to using any new prescription or non-prescription medications, including herbals, vitamins, non-steroidal anti-inflammatory drugs (NSAIDs) and supplements.  This website has more information on Eliquis (apixaban): http://www.eliquis.com/eliquis/home

## 2017-10-18 NOTE — Telephone Encounter (Signed)
Stacy from [place] called with a report in regards to the CT angio chest which is positive for pulmonary embolism.  See report in Epic.  Patient is waiting now. Results discussed with Dr Volanda Napoleon and she stated the pt should be seen in the office to discuss and she does not have any openings.  Dr Maudie Mercury reviewed the pts chart and stated if the pt is still lightheaded and dizzy she should go to the ER. I spoke with the pts daughter and she stated the pt is not better and was advised to go to the ER and she agreed to take her.  I called [facility] and informed the triage nurse Lilia Pro of this.

## 2017-10-18 NOTE — ED Triage Notes (Signed)
Patient sent from imaging with confirmed PE. Patient alert and oriented, NAD. States that she has had intermittent SOB x 2-3 months. Family with patient

## 2017-10-18 NOTE — Progress Notes (Signed)
ANTICOAGULATION CONSULT NOTE - Initial Consult  Pharmacy Consult for Eliquis Indication: pulmonary embolus  Allergies  Allergen Reactions  . Cymbalta [Duloxetine Hcl] Anaphylaxis  . Hydrochlorothiazide     REACTION: rash  . Oxycodone Other (See Comments)    Nightmares  . Ramipril     REACTION: difficulty breathing  . Telmisartan-Hctz     REACTION: rash   Patient Measurements: Height: 5\' 3"  (160 cm) Weight: 160 lb (72.6 kg) IBW/kg (Calculated) : 52.4   Vital Signs: Temp: 98.7 F (37.1 C) (11/08 1230) Temp Source: Oral (11/08 1230) BP: 123/83 (11/08 1315) Pulse Rate: 89 (11/08 1315)  Labs: Recent Labs    10/16/17 1549 10/18/17 1242  HGB  --  13.4  HCT  --  40.6  PLT  --  188  LABPROT  --  12.3  INR  --  0.92  CREATININE  --  1.20*  CKTOTAL 40  --     Estimated Creatinine Clearance: 35.1 mL/min (A) (by C-G formula based on SCr of 1.2 mg/dL (H)).   Medical History: Past Medical History:  Diagnosis Date  . Aortic stenosis   . CVA (cerebral infarction)   . Depression   . Diabetes mellitus   . GERD (gastroesophageal reflux disease)   . Hyperlipidemia   . Hypertension   . Osteoarthritis     Assessment: 62 yoF found to have new PE. No anticoagulation PTA, but is taking Aspirin 325mg  and ibuprofen as needed. Educated patient on her new medication and suggested patient taking lower dose aspirin for 2/2 stroke prevention and use Tylenol for fever and pain.   Goal of Therapy:  Monitor platelets by anticoagulation protocol: Yes   Plan:  Eliquis 10mg  BID x7 days then 5mg  BID Monitor renal function and for s/sx of bleeding  Nicole Kindred L Austen Wygant 10/18/2017,2:04 PM

## 2017-10-18 NOTE — Progress Notes (Signed)
[name] is a 83 y.o. female patient admitted from ED awake, alert - oriented  X 4 - no acute distress noted.  VSS - Blood pressure 124/88, pulse 88, temperature 97.8 F (36.6 C), temperature source Oral, resp. rate 20, height 5\' 3"  (1.6 m), weight 76.7 kg (169 lb), SpO2 100 %.    IV in place, occlusive dsg intact without redness.  Orientation to room, and floor completed with information packet given to patient/family.  Patient declined safety video at this time.  Admission INP armband ID verified with patient/family, and in place.   SR up x 2, fall assessment complete, with patient and family able to verbalize understanding of risk associated with falls, and verbalized understanding to call nsg before up out of bed.  Call light within reach, patient able to voice, and demonstrate understanding.  Skin, clean-dry- intact without evidence of bruising, or skin tears.   No evidence of skin break down noted on exam.     Will cont to eval and treat per MD orders.  Luci Bank, RN 10/18/2017 6:00 PM

## 2017-10-18 NOTE — ED Notes (Signed)
Pt is coming from [place]. Pt went to PCP for some SOB. After Imaging, pt was positive for multiple PEs.

## 2017-10-18 NOTE — Telephone Encounter (Signed)
Stacy @ CT called with CALL REPORT on pt's Chest CT done today:  IMPRESSION: 1. Small pulmonary emboli are present within branches of the right pulmonary artery to the right lower lobe. These emboli do not appear to be occlusive. No central emboli are seen. 2. Moderate thoracic aortic atherosclerosis. 3. Diffuse coronary artery calcifications. 4. This report will we called to the referring physician by the radiology assistant.  Dr. Raliegh Ip is not in the office today. Per Dorothyann Peng, NP, pt was directed to go straight to [facility] ED for evaluation and treatment. Spoke with pt's daughter and advised. She is taking pt directly to [facility] ED.   Dr. Raliegh Ip - [facility]. Thanks!

## 2017-10-18 NOTE — H&P (Signed)
Patient Demographics:    [name], is a 83 y.o. female  MRN: [id number]   DOB - [birth year]  Admit Date - 10/18/2017  Outpatient Primary MD for the patient is Marletta Lor, MD   Assessment & Plan:    Principal Problem:   Pulmonary embolism [facility]) Active Problems:   Essential hypertension   Diabetes mellitus with neuropathy ([place])  CTA Chest 10/18/17:- IMPRESSION: 1. Small pulmonary emboli are present within branches of the right pulmonary artery to the right lower lobe. These emboli do not appear to be occlusive. No central emboli are seen. 2. Moderate thoracic aortic atherosclerosis. 3. Diffuse coronary artery calcifications.   Plan:- 1)Rt Sided Pulm Embolism-risk versus benefit of anticoagulation discussed with patient and daughter at bedside, she would like to use Eliquis, pharmacy consult obtained for Eliquis prescription.  Echocardiogram to rule out right heart strain and to evaluate EF pending, lower extremity venous Dopplers pending.  At this time this PE appears to be "unprovoked", patient will need  lifelong anticoagulation and malignancy workup which can be done as outpatient (last mammogram was more than 5 years ago, last colonoscopy was more than 5 years ago).  Patient is reminded to avoid NSAIDs due to increased risk of bleeding.   2)DM-appears stable, hold metformin due to contrast study, continue glipizide , use sliding scale insulin coverage  3)H/o orthostatic hypotension-continue Florinef  4)H/o Prior CVA-more than 20 years ago, now on Eliquis secondary to pulmonary embolism, continue Lipitor 20 mg  5)Dispo-possible discharge home in 24-48 hours after studies listed above in # 1 are completed and if hemodynamically stable  6)Social/Ethics-plan of care and CODE STATUS discussed  with patient, daughter and son-in-law at bedside, she is a DNR/DNI  With History of - Reviewed by me  Past Medical History:  Diagnosis Date  . Aortic stenosis   . CVA (cerebral infarction)   . Depression   . Diabetes mellitus   . GERD (gastroesophageal reflux disease)   . Hyperlipidemia   . Hypertension   . Osteoarthritis       Past Surgical History:  Procedure Laterality Date  . ABDOMINAL HYSTERECTOMY     fibroids, no cancer  . FRACTURE SURGERY     left leg  . JOINT REPLACEMENT     left and right knee  . ROTATOR CUFF REPAIR    . TEMPOROMANDIBULAR JOINT SURGERY        Chief Complaint  Patient presents with  . Confirmed PE by CT      HPI:    [name]  is a 83 y.o. female presents from PCPs office with a diagnosis of pulmonary embolism as per CTA chest done on 10/18/2017.  Patient has past medical history relevant for orthostatic hypotension, dyslipidemia, previous stroke and diabetes, patient has been having intermittent episodes of shortness of breath and dyspnea on exertion over the last 2-3 weeks.  She did have some pleuritic component.  no recent surgery, no  prolonged immobilization, no falls no trauma,, no recent travel.  She went to PCP with these complaints on 10/18/2017 and a CTA chest was done and came back with findings of right-sided pulmonary embolism.  No prior history of GI bleed or transfusion  No productive cough, no fever no chills, no hemoptysis, no frank chest pains, in the ED patient's daughter and son-in-law at bedside with additional questions.     Review of systems:    In addition to the HPI above,   A full 12 point Review of 10 Systems was done, except as stated above, all other Review of 10 Systems were negative.    Social History:  Reviewed by me    Social History   Tobacco Use  . Smoking status: Former Smoker    Last attempt to quit: 02/07/1981    Years since quitting: 36.7  . Smokeless tobacco: Never Used  Substance Use  Topics  . Alcohol use: No      Family History :  Reviewed by me    Family History  Problem Relation Age of Onset  . Dementia Mother   . Hypertension Mother   . Heart attack Father   . Heart failure Sister      Home Medications:   Prior to Admission medications   Medication Sig Start Date End Date Taking? Authorizing Provider  aspirin 325 MG tablet Take 325 mg by mouth every morning.    Yes [provider]  atorvastatin (LIPITOR) 20 MG tablet TAKE 1 TABLET (20 MG TOTAL) BY MOUTH DAILY. 05/10/17  Yes Marletta Lor, MD  Cyanocobalamin (VITAMIN B 12 PO) Take 1,000 mcg by mouth every morning.    Yes [provider]  esomeprazole (NEXIUM) 40 MG capsule TAKE ONE CAPSULE BY MOUTH EVERY DAY 03/30/17  Yes Marletta Lor, MD  glipiZIDE (GLUCOTROL XL) 2.5 MG 24 hr tablet Take 1 tablet (2.5 mg total) by mouth daily with breakfast. 10/10/17  Yes Marletta Lor, MD  glucose blood (ACCU-CHEK GUIDE) test strip Use to test blood sugars once daily. 10/16/17  Yes Marletta Lor, MD  metFORMIN (GLUCOPHAGE) 500 MG tablet Take 1 tablet (500 mg total) daily with breakfast by mouth. 10/16/17  Yes Marletta Lor, MD  ACCU-CHEK FASTCLIX LANCETS MISC Use to test blood sugars once daily. 10/16/17   Marletta Lor, MD  fludrocortisone (FLORINEF) 0.1 MG tablet Take 1 tablet (0.1 mg total) daily by mouth. 10/16/17   Marletta Lor, MD  imipramine (TOFRANIL) 25 MG tablet TAKE 1 TABLET BY MOUTH EVERY DAY 05/23/17   Marletta Lor, MD     Allergies:     Allergies  Allergen Reactions  . Cymbalta [Duloxetine Hcl] Anaphylaxis  . Oxycodone Other (See Comments)    Nightmares  . Ramipril Other (See Comments)    REACTION: difficulty breathing  . Hydrochlorothiazide Rash  . Telmisartan-Hctz Rash     Physical Exam:   Vitals  Blood pressure (!) 158/95, pulse 89, temperature 98.7 F (37.1 C), temperature source Oral, resp. rate 19, height 5\' 3"  (1.6  m), weight 72.6 kg (160 lb), SpO2 100 %.  Physical Examination: General appearance - alert, well appearing, and in no distress and  Mental status - alert, oriented to person, place, and time,  Eyes - sclera anicteric Neck - supple, no JVD elevation , Chest -diminished in bases right more than left, no wheezing or rhonchi  heart - S1 and S2 normal,  Abdomen - soft, nontender, nondistended, no CVA  tenderness Neurological - screening mental status exam normal, neck supple without rigidity, cranial nerves II through XII intact, DTR's normal and symmetric Extremities -   intact peripheral pulses , no significant pitting edema Skin - warm, dry    Data Review:    CBC Recent Labs  Lab 10/18/17 1242  WBC 6.1  HGB 13.4  HCT 40.6  PLT 188  MCV 90.4  MCH 29.8  MCHC 33.0  RDW 13.2   ------------------------------------------------------------------------------------------------------------------  Chemistries  Recent Labs  Lab 10/18/17 1242  NA 133*  K 5.5*  CL 103  CO2 21*  GLUCOSE 227*  BUN 16  CREATININE 1.20*  CALCIUM 8.5*   ------------------------------------------------------------------------------------------------------------------ estimated creatinine clearance is 35.1 mL/min (A) (by C-G formula based on SCr of 1.2 mg/dL (H)). ------------------------------------------------------------------------------------------------------------------ No results for input(s): TSH, T4TOTAL, T3FREE, THYROIDAB in the last 72 hours.  Invalid input(s): FREET3   Coagulation profile Recent Labs  Lab 10/18/17 1242  INR 0.92   ------------------------------------------------------------------------------------------------------------------- Recent Labs    10/16/17 1549  DDIMER 1.59*   -------------------------------------------------------------------------------------------------------------------  Cardiac Enzymes No results for input(s): CKMB, TROPONINI, MYOGLOBIN in the  last 168 hours.  Invalid input(s): CK ------------------------------------------------------------------------------------------------------------------ No results found for: BNP   ---------------------------------------------------------------------------------------------------------------  Urinalysis    Component Value Date/Time   COLORURINE YELLOW 03/22/2015 2212   APPEARANCEUR CLOUDY (A) 03/22/2015 2212   LABSPEC 1.017 03/22/2015 2212   PHURINE 5.0 03/22/2015 2212   GLUCOSEU 500 (A) 03/22/2015 2212   HGBUR NEGATIVE 03/22/2015 2212   HGBUR negative 08/21/2007 0839   BILIRUBINUR NEGATIVE 03/22/2015 2212   KETONESUR NEGATIVE 03/22/2015 2212   PROTEINUR NEGATIVE 03/22/2015 2212   UROBILINOGEN 0.2 03/22/2015 2212   NITRITE NEGATIVE 03/22/2015 2212   LEUKOCYTESUR NEGATIVE 03/22/2015 2212    ----------------------------------------------------------------------------------------------------------------   Imaging Results:    Ct Angio Chest W/cm &/or Wo Cm  Result Date: 10/18/2017 CLINICAL DATA:  Increasing shortness of breath over several months EXAM: CT ANGIOGRAPHY CHEST WITH CONTRAST TECHNIQUE: Multidetector CT imaging of the chest was performed using the standard protocol during bolus administration of intravenous contrast. Multiplanar CT image reconstructions and MIPs were obtained to evaluate the vascular anatomy. CONTRAST:  80 cc Isovue 370 COMPARISON:  Chest x-ray of 09/16/2012 FINDINGS: Cardiovascular: Moderate thoracic aortic atherosclerosis is present. There are pulmonary emboli within branches of the right pulmonary artery to the right lower lobe. No additional pulmonary emboli are seen. There is moderate cardiomegaly present. No pericardial effusion is seen. Diffuse coronary artery calcifications are noted. The mid ascending thoracic aorta measures 36 mm in diameter pre Mediastinum/Nodes: There are small mediastinal lymph nodes, 1 of the larger being paratracheal just above  the carina measuring 9 mm in short axis diameter. No definite mediastinal or hilar adenopathy is seen. Lungs/Pleura: On lung window images, only a single 3 mm nodule is noted in the left lower lobe of doubtful clinical significance in this age patient. No pneumonia is seen. No pleural effusion is noted. The central airway is patent Upper Abdomen: The portion of the upper abdomen that is visualized is unremarkable. No calcified gallstones are seen. There may be a small hiatal hernia present. Musculoskeletal: The thoracic vertebrae are in normal alignment other than mild kyphosis. There are degenerative changes in the mid to lower thoracic spine. Review of the MIP images confirms the above findings. IMPRESSION: 1. Small pulmonary emboli are present within branches of the right pulmonary artery to the right lower lobe. These emboli do not appear to be occlusive. No central emboli are seen. 2.  Moderate thoracic aortic atherosclerosis. 3. Diffuse coronary artery calcifications. 4. This report will we called to the referring physician by the radiology assistant. Electronically Signed   By: Ivar Drape M.D.   On: 10/18/2017 11:38    Radiological Exams on Admission: Ct Angio Chest W/cm &/or Wo Cm  Result Date: 10/18/2017 CLINICAL DATA:  Increasing shortness of breath over several months EXAM: CT ANGIOGRAPHY CHEST WITH CONTRAST TECHNIQUE: Multidetector CT imaging of the chest was performed using the standard protocol during bolus administration of intravenous contrast. Multiplanar CT image reconstructions and MIPs were obtained to evaluate the vascular anatomy. CONTRAST:  80 cc Isovue 370 COMPARISON:  Chest x-ray of 09/16/2012 FINDINGS: Cardiovascular: Moderate thoracic aortic atherosclerosis is present. There are pulmonary emboli within branches of the right pulmonary artery to the right lower lobe. No additional pulmonary emboli are seen. There is moderate cardiomegaly present. No pericardial effusion is seen.  Diffuse coronary artery calcifications are noted. The mid ascending thoracic aorta measures 36 mm in diameter pre Mediastinum/Nodes: There are small mediastinal lymph nodes, 1 of the larger being paratracheal just above the carina measuring 9 mm in short axis diameter. No definite mediastinal or hilar adenopathy is seen. Lungs/Pleura: On lung window images, only a single 3 mm nodule is noted in the left lower lobe of doubtful clinical significance in this age patient. No pneumonia is seen. No pleural effusion is noted. The central airway is patent Upper Abdomen: The portion of the upper abdomen that is visualized is unremarkable. No calcified gallstones are seen. There may be a small hiatal hernia present. Musculoskeletal: The thoracic vertebrae are in normal alignment other than mild kyphosis. There are degenerative changes in the mid to lower thoracic spine. Review of the MIP images confirms the above findings. IMPRESSION: 1. Small pulmonary emboli are present within branches of the right pulmonary artery to the right lower lobe. These emboli do not appear to be occlusive. No central emboli are seen. 2. Moderate thoracic aortic atherosclerosis. 3. Diffuse coronary artery calcifications. 4. This report will we called to the referring physician by the radiology assistant. Electronically Signed   By: Ivar Drape M.D.   On: 10/18/2017 11:38    DVT Prophylaxis -eliquis AM Labs Ordered, also please review Full Orders  Family Communication: Admission, patients condition and plan of care including tests being ordered have been discussed with the patient and daughter and son-in-law at bedside who indicate understanding and agree with the plan   Code Status - DNR  Likely DC to  home  Condition   stable  Gregori Abril M.D on 10/18/2017 at 3:56 PM   Between 7am to 7pm - Pager - [phone] After 7pm go to [email or web address] - password [email or web address]  [facility] - Office  [phone]  Voice Recognition  Viviann Spare dictation system was used to create this note, attempts have been made to correct errors. Please contact the author with questions and/or clarifications.

## 2017-10-18 NOTE — ED Notes (Signed)
Pt given ice water.

## 2017-10-18 NOTE — ED Provider Notes (Signed)
[facility] EMERGENCY DEPARTMENT Provider Note   CSN: [id number] Arrival date & time: 10/18/17  1218     History   Chief Complaint Chief Complaint  Patient presents with  . Confirmed PE by CT    HPI [name] is a 83 y.o. female.  Patient is an 83 year old female with past medical history of prior CVA, diabetes, GERD, hypertension, and diabetes.  She was sent here for further treatment of pulmonary embolism diagnosed today by her primary care provider.  She reports shortness of breath over the past 2 weeks.  She denies any chest pain or productive cough.  She went to see her doctor who ordered a CT scan which was positive for PE.  She has no prior history of blood clots and takes no blood thinners.   The history is provided by the patient.    Past Medical History:  Diagnosis Date  . Aortic stenosis   . CVA (cerebral infarction)   . Depression   . Diabetes mellitus   . GERD (gastroesophageal reflux disease)   . Hyperlipidemia   . Hypertension   . Osteoarthritis     Patient Active Problem List   Diagnosis Date Noted  . Diabetes mellitus with neuropathy ([place]) 04/21/2016  . OTHER ANXIETY STATES 11/01/2007  . Aortic valve disorder 08/14/2007  . Osteoarthritis 08/14/2007  . VERTIGO 07/30/2007  . Dyslipidemia 10/21/2006  . Depression, recurrent ([place]) 10/21/2006  . Essential hypertension 10/21/2006  . GERD 10/21/2006  . History of cardiovascular disorder 10/21/2006    Past Surgical History:  Procedure Laterality Date  . ABDOMINAL HYSTERECTOMY     fibroids, no cancer  . FRACTURE SURGERY     left leg  . JOINT REPLACEMENT     left and right knee  . ROTATOR CUFF REPAIR    . TEMPOROMANDIBULAR JOINT SURGERY      OB History    No data available       Home Medications    Prior to Admission medications   Medication Sig Start Date End Date Taking? Authorizing Provider  ACCU-CHEK FASTCLIX LANCETS MISC Use to test blood sugars once daily.  10/16/17   Marletta Lor, MD  aspirin 325 MG tablet Take 325 mg by mouth every morning.     [provider]  atorvastatin (LIPITOR) 20 MG tablet TAKE 1 TABLET (20 MG TOTAL) BY MOUTH DAILY. 05/10/17   Marletta Lor, MD  Cyanocobalamin (VITAMIN B 12 PO) Take 1,000 mcg by mouth every morning.     [provider]  esomeprazole (NEXIUM) 40 MG capsule TAKE ONE CAPSULE BY MOUTH EVERY DAY 03/30/17   Marletta Lor, MD  fludrocortisone (FLORINEF) 0.1 MG tablet Take 1 tablet (0.1 mg total) daily by mouth. 10/16/17   Marletta Lor, MD  glipiZIDE (GLUCOTROL XL) 2.5 MG 24 hr tablet Take 1 tablet (2.5 mg total) by mouth daily with breakfast. 10/10/17   Marletta Lor, MD  glucose blood (ACCU-CHEK GUIDE) test strip Use to test blood sugars once daily. 10/16/17   Marletta Lor, MD  ibuprofen (ADVIL,MOTRIN) 200 MG tablet Take 400 mg by mouth every 6 (six) hours as needed for moderate pain (pain).    [provider]  imipramine (TOFRANIL) 25 MG tablet TAKE 1 TABLET BY MOUTH EVERY DAY 05/23/17   Marletta Lor, MD  metFORMIN (GLUCOPHAGE) 500 MG tablet Take 1 tablet (500 mg total) daily with breakfast by mouth. 10/16/17   Marletta Lor, MD  Multiple Vitamin (  MULTIVITAMIN) tablet Take 1 tablet by mouth daily.      [provider]    Family History Family History  Problem Relation Age of Onset  . Dementia Mother   . Hypertension Mother   . Heart attack Father   . Heart failure Sister     Social History Social History   Tobacco Use  . Smoking status: Former Smoker    Last attempt to quit: 02/07/1981    Years since quitting: 36.7  . Smokeless tobacco: Never Used  Substance Use Topics  . Alcohol use: No  . Drug use: No     Allergies   Cymbalta [duloxetine hcl]; Hydrochlorothiazide; Oxycodone; Ramipril; and Telmisartan-hctz   Review of Systems Review of Systems  All other systems reviewed and are  negative.    Physical Exam Updated Vital Signs BP (!) 147/98   Pulse 97   Temp 98.7 F (37.1 C) (Oral)   Resp (!) 26   Ht 5\' 3"  (1.6 m)   Wt 72.6 kg (160 lb)   SpO2 99%   BMI 28.34 kg/m   Physical Exam  Constitutional: She is oriented to person, place, and time. She appears well-developed and well-nourished. No distress.  HENT:  Head: Normocephalic and atraumatic.  Neck: Normal range of motion. Neck supple.  Cardiovascular: Normal rate and regular rhythm. Exam reveals no gallop and no friction rub.  No murmur heard. Pulmonary/Chest: Effort normal and breath sounds normal. No respiratory distress. She has no wheezes.  Abdominal: Soft. Bowel sounds are normal. She exhibits no distension. There is no tenderness.  Musculoskeletal: Normal range of motion. She exhibits no edema or tenderness.  Neurological: She is alert and oriented to person, place, and time.  Skin: Skin is warm and dry. She is not diaphoretic.  Nursing note and vitals reviewed.    ED Treatments / Results  Labs (all labs ordered are listed, but only abnormal results are displayed) Labs Reviewed  BASIC METABOLIC PANEL  CBC  PROTIME-INR  I-STAT TROPONIN, ED    EKG  EKG Interpretation None       Radiology Ct Angio Chest W/cm &/or Wo Cm  Result Date: 10/18/2017 CLINICAL DATA:  Increasing shortness of breath over several months EXAM: CT ANGIOGRAPHY CHEST WITH CONTRAST TECHNIQUE: Multidetector CT imaging of the chest was performed using the standard protocol during bolus administration of intravenous contrast. Multiplanar CT image reconstructions and MIPs were obtained to evaluate the vascular anatomy. CONTRAST:  80 cc Isovue 370 COMPARISON:  Chest x-ray of 09/16/2012 FINDINGS: Cardiovascular: Moderate thoracic aortic atherosclerosis is present. There are pulmonary emboli within branches of the right pulmonary artery to the right lower lobe. No additional pulmonary emboli are seen. There is moderate  cardiomegaly present. No pericardial effusion is seen. Diffuse coronary artery calcifications are noted. The mid ascending thoracic aorta measures 36 mm in diameter pre Mediastinum/Nodes: There are small mediastinal lymph nodes, 1 of the larger being paratracheal just above the carina measuring 9 mm in short axis diameter. No definite mediastinal or hilar adenopathy is seen. Lungs/Pleura: On lung window images, only a single 3 mm nodule is noted in the left lower lobe of doubtful clinical significance in this age patient. No pneumonia is seen. No pleural effusion is noted. The central airway is patent Upper Abdomen: The portion of the upper abdomen that is visualized is unremarkable. No calcified gallstones are seen. There may be a small hiatal hernia present. Musculoskeletal: The thoracic vertebrae are in normal alignment other than mild kyphosis. There  are degenerative changes in the mid to lower thoracic spine. Review of the MIP images confirms the above findings. IMPRESSION: 1. Small pulmonary emboli are present within branches of the right pulmonary artery to the right lower lobe. These emboli do not appear to be occlusive. No central emboli are seen. 2. Moderate thoracic aortic atherosclerosis. 3. Diffuse coronary artery calcifications. 4. This report will we called to the referring physician by the radiology assistant. Electronically Signed   By: Ivar Drape M.D.   On: 10/18/2017 11:38    Procedures Procedures (including critical care time)  Medications Ordered in ED Medications - No data to display   Initial Impression / Assessment and Plan / ED Course  I have reviewed the triage vital signs and the nursing notes.  Pertinent labs & imaging results that were available during my care of the patient were reviewed by me and considered in my medical decision making (see chart for details).  Patient will be admitted to the hospitalist service for treatment of PE and observation.  Final Clinical  Impressions(s) / ED Diagnoses   Final diagnoses:  None    ED Discharge Orders    None       Veryl Speak, MD 10/18/17 1407

## 2017-10-19 ENCOUNTER — Telehealth: Payer: Self-pay | Admitting: Internal Medicine

## 2017-10-19 ENCOUNTER — Inpatient Hospital Stay (HOSPITAL_COMMUNITY): Payer: Medicare Other

## 2017-10-19 DIAGNOSIS — I2699 Other pulmonary embolism without acute cor pulmonale: Secondary | ICD-10-CM

## 2017-10-19 LAB — GLUCOSE, CAPILLARY
GLUCOSE-CAPILLARY: 169 mg/dL — AB (ref 65–99)
GLUCOSE-CAPILLARY: 143 mg/dL — AB (ref 65–99)
GLUCOSE-CAPILLARY: 238 mg/dL — AB (ref 65–99)
Glucose-Capillary: 131 mg/dL — ABNORMAL HIGH (ref 65–99)

## 2017-10-19 LAB — BASIC METABOLIC PANEL
ANION GAP: 9 (ref 5–15)
BUN: 13 mg/dL (ref 6–20)
CALCIUM: 8.2 mg/dL — AB (ref 8.9–10.3)
CO2: 24 mmol/L (ref 22–32)
CREATININE: 1.04 mg/dL — AB (ref 0.44–1.00)
Chloride: 105 mmol/L (ref 101–111)
GFR calc Af Amer: 57 mL/min — ABNORMAL LOW (ref >60)
GFR calc non Af Amer: 49 mL/min — ABNORMAL LOW (ref >60)
GLUCOSE: 142 mg/dL — AB (ref 65–99)
Potassium: 2.8 mmol/L — ABNORMAL LOW (ref 3.5–5.1)
Sodium: 138 mmol/L (ref 135–145)

## 2017-10-19 LAB — CBC
HCT: 35.9 % — ABNORMAL LOW (ref 36.0–46.0)
HEMOGLOBIN: 11.9 g/dL — AB (ref 12.0–15.0)
MCH: 29.4 pg (ref 26.0–34.0)
MCHC: 33.1 g/dL (ref 30.0–36.0)
MCV: 88.6 fL (ref 78.0–100.0)
Platelets: 149 10*3/uL — ABNORMAL LOW (ref 150–400)
RBC: 4.05 MIL/uL (ref 3.87–5.11)
RDW: 12.9 % (ref 11.5–15.5)
WBC: 4.9 10*3/uL (ref 4.0–10.5)

## 2017-10-19 LAB — POTASSIUM: Potassium: 3.6 mmol/L (ref 3.5–5.1)

## 2017-10-19 MED ORDER — POTASSIUM CHLORIDE CRYS ER 20 MEQ PO TBCR
40.0000 meq | EXTENDED_RELEASE_TABLET | Freq: Once | ORAL | Status: AC
  Administered 2017-10-19: 40 meq via ORAL
  Filled 2017-10-19: qty 2

## 2017-10-19 MED ORDER — POTASSIUM CHLORIDE 10 MEQ/100ML IV SOLN
10.0000 meq | INTRAVENOUS | Status: AC
  Administered 2017-10-19 (×2): 10 meq via INTRAVENOUS
  Filled 2017-10-19 (×2): qty 100

## 2017-10-19 MED ORDER — ONETOUCH ULTRA 2 W/DEVICE KIT: 1.0000 | PACK | Freq: Three times a day (TID) | 0 refills | Status: DC | PRN

## 2017-10-19 MED ORDER — ONETOUCH ULTRASOFT LANCETS MISC: 12 refills | Status: DC

## 2017-10-19 MED ORDER — GLUCOSE BLOOD VI STRP: ORAL_STRIP | 12 refills | Status: DC

## 2017-10-19 NOTE — Progress Notes (Signed)
Roselind Messier, RN        # 1.  S/w cassandra @ [facility] # [phone]     1. XARELTO 15 MG BID  COVER- YES  CO-PAY- $ 200.00  TIER- 2 DRUG  PRIOR APPROVAL- NO  90 DAY SUPPLY @ RETAIL $ 60.00    2. XARELTO 20 MG DAILY  COVER- YES  CO-PAY- $ 103.81  TIER- 2 DRUG  PRIOR APPROVAL- NO  90 DAY SUPPLY @ RETAIL $ 60.00   OUT-OF -POCKET-NOT MET   PREFERRED PHARMACY: [facility]   Previous Messages    ----- Message -----  From: Cecille Rubin, RN  Sent: 10/19/2017  2:03 PM  To: [facility] Case Mgr Assistant  Subject: Benefits check                  Please check copay Xaralto15 mg twice a day for 21 days , then 20 mg Everyday. Thanks      Whitman Hero RN,BSN,CM

## 2017-10-19 NOTE — Telephone Encounter (Signed)
Rx's sent to pharmacy as requested. 

## 2017-10-19 NOTE — Progress Notes (Signed)
Benefits check in process for Xaralto: Xaralto15 mg twice a day for 21 days , then 20 mg everyday. CM to f/u with results. Whitman Hero RN,BSN,CM

## 2017-10-19 NOTE — Progress Notes (Signed)
*  PRELIMINARY RESULTS* Vascular Ultrasound Bilateral lower extremity venous duplex has been completed.  Preliminary findings: Right lower extremity appears negative for deep vein thrombosis.  Short segment of acute deep vein thrombosis noted in the left proximal peroneal veins, other visualized veins of the left lower extremity appear negative for thrombosis.  Negative for bakers cysts bilaterally.  Preliminary results called to patients RN, [facility] @ 14:17  Everrett Coombe 10/19/2017, 2:16 PM

## 2017-10-19 NOTE — Progress Notes (Signed)
Roselind Messier, RN        # 1. S/W CASSANDRA @ [facility] # [phone]   ELIQUIS - 10 MG -NONE FORMULARY   1. ELIQUIS 2.5 MG BID  COVER- YES  CO-PAY- $ 103.80  TIER- 2 DRUG  PRIOR APPROVAL- NO   2. ELIQUIS 5 MG BID  COVER- YES  CO-PAY- $ 103.80  TIER-2 DRUG  PRIOR APPROVAL- NO   NO DEDUCTIBLE  OUT-OF -POCKET -NOT MET   PREFERRED PHARMACY : [facility]   Previous Messages    ----- Message -----  From: Cecille Rubin, RN  Sent: 10/19/2017  9:25 AM  To: [facility] Case Mgr Assistant  Subject: Benefits check                  Please check copay for Eliquis 72m twice a day x7 days then 559mtwice a day. Thanks      AnWhitman HeroN,BSN,CM

## 2017-10-19 NOTE — Consult Note (Signed)
[facility] CM Primary Care Navigator  10/19/2017  [name] May 21, 1934 [id number]   Met withpatient and daughter Marden Noble) at the bedside to identify possibledischarge needs.  Daughter reports that patient was having "shortness of breath" over a couple of weeks and was seen by primary care provider with CT of chest done that came back with findings of right-sided pulmonary embolism which had led to this admission.  Patient endorses Dr. Bluford Kaufmann with [facility] [place] as the primary care provider.   [facility] [address] [facility].  Patient verbalized that shemanages her medications at home straight out of the containers using a "List system".  Patient's daughter states that she and her husband Cristie Hem) will provide transportation to patient's doctors' appointments.  Patient's daughter will serve as a the primary caregiver at home and son-in law will be providing assistance as well.  Discharge plan remains to be determined pending tests/ interventions and improvement per daughter.    Patient and daughter voiced understanding to call primary care provider's office when she returns back home for a post discharge follow-up appointment within a week or sooner if needs arise.Patient letter (with PCP's contact number) wasprovided as theirreminder.  Explained to patient and daughter regarding[facility] CM services available for healthmanagement at home but patient states that she is "able to manage self so far" and daughter will be there to assist her. Daughter mentioned that she called and requested for needed DM supplies from patient's primary provider's office and is now currently processed. Discharge disposition still pending but patient's daughter is aware and expressedunderstanding to seekreferral to [facility] care managementfrom primary care provider ifdeemed necessary  andappropriatefor services in the near future   [facility] care management information provided for any future needs that may arise.  Primary care provider's office is listed to provide transition of care (TOC) and post hospital follow-up.   For additional questions please contact:  Edwena Felty A. Wenzel Backlund, BSN, RN-BC [facility] PRIMARY CARE Navigator Cell: [phone]

## 2017-10-19 NOTE — Progress Notes (Signed)
PROGRESS NOTE    [name]  QJJ:[id number] DOB: 1934-09-06 DOA: 10/18/2017 PCP: Marletta Lor, MD    Brief Narrative: 83 year old presented with new diagnosis of PE.   Assessment & Plan:   Principal Problem:   Pulmonary embolism ([place]) - Pt started on Eliquis - echocardiogram and LE dopplers completed.  Active Problems:   Essential hypertension - For now will treat with low sodium diet.    Diabetes mellitus with neuropathy ([place]) - Pt on glucotrol   DVT prophylaxis: Pt is on Eliquis Code Status: DNR Family Communication: None at bedside Disposition Plan: pending improvement in condition   Consultants:   None   Procedures: None   Antimicrobials: None   Subjective: Pt has no new complaints, no acute issues overnight.  Objective: Vitals:   10/18/17 1636 10/18/17 2118 10/19/17 0458 10/19/17 0522  BP: 124/88 (!) 166/84 (!) 178/82 (!) 156/91  Pulse: 88 100 97 83  Resp: 20 18 18    Temp: 97.8 F (36.6 C) 97.7 F (36.5 C) 98 F (36.7 C)   TempSrc: Oral Oral Oral   SpO2: 100% 100% 97%   Weight: 76.7 kg (169 lb)     Height: 5\' 3"  (1.6 m)       Intake/Output Summary (Last 24 hours) at 10/19/2017 1734 Last data filed at 10/19/2017 1501 Gross per 24 hour  Intake 263 ml  Output 820 ml  Net -557 ml   Filed Weights   10/18/17 1230 10/18/17 1636  Weight: 72.6 kg (160 lb) 76.7 kg (169 lb)    Examination:  General exam: Appears calm and comfortable, in nad. Respiratory system: Clear to auscultation. Respiratory effort normal. Equal chest rise. Cardiovascular system: S1 & S2 heard, RRR. No JVD, murmurs, rubs, gallops or clicks.  Gastrointestinal system: Abdomen is nondistended, soft and nontender. No organomegaly or masses felt. Normal bowel sounds heard. Central nervous system: Alert and oriented. No focal neurological deficits. Extremities: Symmetric 5 x 5 power. Skin: No rashes, lesions or ulcers Psychiatry:  Mood & affect appropriate.    Data Reviewed: I have personally reviewed following labs and imaging studies  CBC: Recent Labs  Lab 10/18/17 1242 10/19/17 0245  WBC 6.1 4.9  HGB 13.4 11.9*  HCT 40.6 35.9*  MCV 90.4 88.6  PLT 188 481*   Basic Metabolic Panel: Recent Labs  Lab 10/18/17 1242 10/19/17 0245  NA 133* 138  K 5.5* 2.8*  CL 103 105  CO2 21* 24  GLUCOSE 227* 142*  BUN 16 13  CREATININE 1.20* 1.04*  CALCIUM 8.5* 8.2*   GFR: Estimated Creatinine Clearance: 41.6 mL/min (A) (by C-G formula based on SCr of 1.04 mg/dL (H)). Liver Function Tests: No results for input(s): AST, ALT, ALKPHOS, BILITOT, PROT, ALBUMIN in the last 168 hours. No results for input(s): LIPASE, AMYLASE in the last 168 hours. No results for input(s): AMMONIA in the last 168 hours. Coagulation Profile: Recent Labs  Lab 10/18/17 1242  INR 0.92   Cardiac Enzymes: Recent Labs  Lab 10/16/17 1549  CKTOTAL 40   BNP (last 3 results) No results for input(s): PROBNP in the last 8760 hours. HbA1C: No results for input(s): HGBA1C in the last 72 hours. CBG: Recent Labs  Lab 10/18/17 1652 10/18/17 2117 10/19/17 0759 10/19/17 1220 10/19/17 1633  GLUCAP 118* 154* 131* 169* 143*   Lipid Profile: No results for input(s): CHOL, HDL, LDLCALC, TRIG, CHOLHDL, LDLDIRECT in the last 72 hours. Thyroid Function Tests: No results for input(s): TSH, T4TOTAL, FREET4, T3FREE, THYROIDAB in the  last 72 hours. Anemia Panel: No results for input(s): VITAMINB12, FOLATE, FERRITIN, TIBC, IRON, RETICCTPCT in the last 72 hours. Sepsis Labs: No results for input(s): PROCALCITON, LATICACIDVEN in the last 168 hours.  No results found for this or any previous visit (from the past 240 hour(s)).       Radiology Studies: Ct Angio Chest W/cm &/or Wo Cm  Result Date: 10/18/2017 CLINICAL DATA:  Increasing shortness of breath over several months EXAM: CT ANGIOGRAPHY CHEST WITH CONTRAST TECHNIQUE: Multidetector CT imaging of the chest was  performed using the standard protocol during bolus administration of intravenous contrast. Multiplanar CT image reconstructions and MIPs were obtained to evaluate the vascular anatomy. CONTRAST:  80 cc Isovue 370 COMPARISON:  Chest x-ray of 09/16/2012 FINDINGS: Cardiovascular: Moderate thoracic aortic atherosclerosis is present. There are pulmonary emboli within branches of the right pulmonary artery to the right lower lobe. No additional pulmonary emboli are seen. There is moderate cardiomegaly present. No pericardial effusion is seen. Diffuse coronary artery calcifications are noted. The mid ascending thoracic aorta measures 36 mm in diameter pre Mediastinum/Nodes: There are small mediastinal lymph nodes, 1 of the larger being paratracheal just above the carina measuring 9 mm in short axis diameter. No definite mediastinal or hilar adenopathy is seen. Lungs/Pleura: On lung window images, only a single 3 mm nodule is noted in the left lower lobe of doubtful clinical significance in this age patient. No pneumonia is seen. No pleural effusion is noted. The central airway is patent Upper Abdomen: The portion of the upper abdomen that is visualized is unremarkable. No calcified gallstones are seen. There may be a small hiatal hernia present. Musculoskeletal: The thoracic vertebrae are in normal alignment other than mild kyphosis. There are degenerative changes in the mid to lower thoracic spine. Review of the MIP images confirms the above findings. IMPRESSION: 1. Small pulmonary emboli are present within branches of the right pulmonary artery to the right lower lobe. These emboli do not appear to be occlusive. No central emboli are seen. 2. Moderate thoracic aortic atherosclerosis. 3. Diffuse coronary artery calcifications. 4. This report will we called to the referring physician by the radiology assistant. Electronically Signed   By: Ivar Drape M.D.   On: 10/18/2017 11:38        Scheduled Meds: . apixaban  10  mg Oral BID  . [START ON 10/26/2017] apixaban  5 mg Oral BID  . atorvastatin  20 mg Oral q1800  . fludrocortisone  0.1 mg Oral Daily  . glipiZIDE  2.5 mg Oral Q breakfast  . imipramine  25 mg Oral Daily  . insulin aspart  0-9 Units Subcutaneous TID WC  . pantoprazole  40 mg Oral Daily  . senna  1 tablet Oral BID  . sodium chloride flush  3 mL Intravenous Q12H  . vitamin B-12  1,000 mcg Oral q morning - 10a   Continuous Infusions: . sodium chloride       LOS: 1 day    Time spent: > 35 minutes    Velvet Bathe, MD [facility] Pager [phone]  If 7PM-7AM, please contact night-coverage [email or web address] Password [facility] 10/19/2017, 5:34 PM

## 2017-10-19 NOTE — Progress Notes (Signed)
Benefits check in process for Eliquis 10mg  BID x7 days then 5mg  BID. CM to f/u and note results when recieved. CM to provide pt with a 30 day free Eliquis card prior to d/c with explanation of usage. [facility] RN,BSN,CM

## 2017-10-19 NOTE — Telephone Encounter (Signed)
The test strips and lancets we sent  (ACCU-CHEK)  to the pharmacy were not covered on her insurance plan  Pt needs:  ONE TOUCH ULTRA meter One touch ultra test strips lancets  [id number] [place] [place] - [address]  Pt being dc'd from hospital today and needs asap.

## 2017-10-20 LAB — GLUCOSE, CAPILLARY
Glucose-Capillary: 142 mg/dL — ABNORMAL HIGH (ref 65–99)
Glucose-Capillary: 284 mg/dL — ABNORMAL HIGH (ref 65–99)

## 2017-10-20 MED ORDER — APIXABAN 5 MG PO TABS: 10.0000 mg | ORAL_TABLET | Freq: Two times a day (BID) | ORAL | 0 refills | Status: DC

## 2017-10-20 MED ORDER — APIXABAN 5 MG PO TABS: 5.0000 mg | ORAL_TABLET | Freq: Two times a day (BID) | ORAL | 0 refills | Status: DC

## 2017-10-20 NOTE — Discharge Summary (Addendum)
Physician Discharge Summary  [name] ZOX:[id number] DOB: 08-08-1934 DOA: 10/18/2017  PCP: Marletta Lor, MD  Admit date: 10/18/2017 Discharge date: 10/20/2017  Time spent: > 35 minutes  Recommendations for Outpatient Follow-up:  1. Monitor blood pressures and adjust antihypertensives accordingly 2. Ensure patient is taking anticoagulant properly   Discharge Diagnoses:  Principal Problem:   Pulmonary embolism ([place]) Active Problems:   Essential hypertension   Diabetes mellitus with neuropathy [facility])   Discharge Condition: stable  Diet recommendation: diabetic diet/low sodium  Filed Weights   10/18/17 1230 10/18/17 1636  Weight: 72.6 kg (160 lb) 76.7 kg (169 lb)    History of present illness:  83 year old who presented with new diagnosis of PE found to have new DVT at left lower extremity  Hospital Course:  PE/ LLE DVT - Placed on eliquis and has tolerated - recommended follow up with primary after hospital discharge. - Echocardiogram reported normal EF 50-55%  Otherwise for known medical conditions listed above continue prior to admission medication regimen listed below  Procedures:  Echocardiogram  Consultations:  None  Discharge Exam: Vitals:   10/20/17 0550 10/20/17 1450  BP: (!) 160/100 (!) 154/92  Pulse: 94 82  Resp: 19 18  Temp: 97.8 F (36.6 C) 97.7 F (36.5 C)  SpO2: 95% 97%    General: Pt in nad, alert and awake Cardiovascular: rrr, no rubs Respiratory: no increased wob, no wheezes  Discharge Instructions   Discharge Instructions    Call MD for:  difficulty breathing, headache or visual disturbances   Complete by:  As directed    Call MD for:  extreme fatigue   Complete by:  As directed    Call MD for:  temperature >100.4   Complete by:  As directed    Diet - low sodium heart healthy   Complete by:  As directed    Discharge instructions   Complete by:  As directed    Please follow-up with her primary care physician  within the next week.   Increase activity slowly   Complete by:  As directed      Current Discharge Medication List    START taking these medications   Details  !! apixaban (ELIQUIS) 5 MG TABS tablet Take 1 tablet (5 mg total) 2 (two) times daily by mouth. Qty: 60 tablet, Refills: 0    !! apixaban (ELIQUIS) 5 MG TABS tablet Take 2 tablets (10 mg total) 2 (two) times daily for 5 days by mouth. Qty: 20 tablet, Refills: 0     !! - Potential duplicate medications found. Please discuss with provider.    CONTINUE these medications which have NOT CHANGED   Details  atorvastatin (LIPITOR) 20 MG tablet TAKE 1 TABLET (20 MG TOTAL) BY MOUTH DAILY. Qty: 90 tablet, Refills: 1    Cyanocobalamin (VITAMIN B 12 PO) Take 1,000 mcg by mouth every morning.     esomeprazole (NEXIUM) 40 MG capsule TAKE ONE CAPSULE BY MOUTH EVERY DAY Qty: 30 capsule, Refills: 0    glipiZIDE (GLUCOTROL XL) 2.5 MG 24 hr tablet Take 1 tablet (2.5 mg total) by mouth daily with breakfast. Qty: 60 tablet, Refills: 0    metFORMIN (GLUCOPHAGE) 500 MG tablet Take 1 tablet (500 mg total) daily with breakfast by mouth. Qty: 180 tablet, Refills: 3    !! ACCU-CHEK FASTCLIX LANCETS MISC Use to test blood sugars once daily. Qty: 100 each, Refills: 3    Blood Glucose Monitoring Suppl (ONE TOUCH ULTRA 2) w/Device KIT 1 Device 3 (  three) times daily as needed by Does not apply route. Qty: 1 each, Refills: 0    fludrocortisone (FLORINEF) 0.1 MG tablet Take 1 tablet (0.1 mg total) daily by mouth. Qty: 90 tablet, Refills: 3    glucose blood test strip Use as instructed Qty: 100 each, Refills: 12    imipramine (TOFRANIL) 25 MG tablet TAKE 1 TABLET BY MOUTH EVERY DAY Qty: 90 tablet, Refills: 1    !! Lancets (ONETOUCH ULTRASOFT) lancets Use as instructed Qty: 100 each, Refills: 12     !! - Potential duplicate medications found. Please discuss with provider.    STOP taking these medications     aspirin 325 MG tablet         Allergies  Allergen Reactions  . Cymbalta [Duloxetine Hcl] Anaphylaxis  . Oxycodone Other (See Comments)    Nightmares  . Ramipril Other (See Comments)    REACTION: difficulty breathing  . Hydrochlorothiazide Rash  . Telmisartan-Hctz Rash      The results of significant diagnostics from this hospitalization (including imaging, microbiology, ancillary and laboratory) are listed below for reference.    Significant Diagnostic Studies: Ct Angio Chest W/cm &/or Wo Cm  Result Date: 10/18/2017 CLINICAL DATA:  Increasing shortness of breath over several months EXAM: CT ANGIOGRAPHY CHEST WITH CONTRAST TECHNIQUE: Multidetector CT imaging of the chest was performed using the standard protocol during bolus administration of intravenous contrast. Multiplanar CT image reconstructions and MIPs were obtained to evaluate the vascular anatomy. CONTRAST:  80 cc Isovue 370 COMPARISON:  Chest x-ray of 09/16/2012 FINDINGS: Cardiovascular: Moderate thoracic aortic atherosclerosis is present. There are pulmonary emboli within branches of the right pulmonary artery to the right lower lobe. No additional pulmonary emboli are seen. There is moderate cardiomegaly present. No pericardial effusion is seen. Diffuse coronary artery calcifications are noted. The mid ascending thoracic aorta measures 36 mm in diameter pre Mediastinum/Nodes: There are small mediastinal lymph nodes, 1 of the larger being paratracheal just above the carina measuring 9 mm in short axis diameter. No definite mediastinal or hilar adenopathy is seen. Lungs/Pleura: On lung window images, only a single 3 mm nodule is noted in the left lower lobe of doubtful clinical significance in this age patient. No pneumonia is seen. No pleural effusion is noted. The central airway is patent Upper Abdomen: The portion of the upper abdomen that is visualized is unremarkable. No calcified gallstones are seen. There may be a small hiatal hernia present.  Musculoskeletal: The thoracic vertebrae are in normal alignment other than mild kyphosis. There are degenerative changes in the mid to lower thoracic spine. Review of the MIP images confirms the above findings. IMPRESSION: 1. Small pulmonary emboli are present within branches of the right pulmonary artery to the right lower lobe. These emboli do not appear to be occlusive. No central emboli are seen. 2. Moderate thoracic aortic atherosclerosis. 3. Diffuse coronary artery calcifications. 4. This report will we called to the referring physician by the radiology assistant. Electronically Signed   By: Ivar Drape M.D.   On: 10/18/2017 11:38    Microbiology: No results found for this or any previous visit (from the past 240 hour(s)).   Labs: Basic Metabolic Panel: Recent Labs  Lab 10/18/17 1242 10/19/17 0245 10/19/17 1708  NA 133* 138  --   K 5.5* 2.8* 3.6  CL 103 105  --   CO2 21* 24  --   GLUCOSE 227* 142*  --   BUN 16 13  --   CREATININE  1.20* 1.04*  --   CALCIUM 8.5* 8.2*  --    Liver Function Tests: No results for input(s): AST, ALT, ALKPHOS, BILITOT, PROT, ALBUMIN in the last 168 hours. No results for input(s): LIPASE, AMYLASE in the last 168 hours. No results for input(s): AMMONIA in the last 168 hours. CBC: Recent Labs  Lab 10/18/17 1242 10/19/17 0245  WBC 6.1 4.9  HGB 13.4 11.9*  HCT 40.6 35.9*  MCV 90.4 88.6  PLT 188 149*   Cardiac Enzymes: Recent Labs  Lab 10/16/17 1549  CKTOTAL 40   BNP: BNP (last 3 results) No results for input(s): BNP in the last 8760 hours.  ProBNP (last 3 results) No results for input(s): PROBNP in the last 8760 hours.  CBG: Recent Labs  Lab 10/19/17 1220 10/19/17 1633 10/19/17 2047 10/20/17 0748 10/20/17 1151  GLUCAP 169* 143* 238* 142* 284*    Signed:  Velvet Bathe MD.  [facility] 10/20/2017, 3:41 PM

## 2017-10-20 NOTE — Progress Notes (Signed)
Eylin Munar to be D/C'd Home per MD order.  Discussed with the patient and all questions fully answered.  VSS, Skin clean, dry and intact without evidence of skin break down, no evidence of skin tears noted. IV catheter discontinued intact. Site without signs and symptoms of complications. Dressing and pressure applied.  An After Visit Summary was printed and given to the patient. Patient received prescription.  Allergies as of 10/20/2017      Reactions   Cymbalta [duloxetine Hcl] Anaphylaxis   Oxycodone Other (See Comments)   Nightmares   Ramipril Other (See Comments)   REACTION: difficulty breathing   Hydrochlorothiazide Rash   Telmisartan-hctz Rash      Medication List    STOP taking these medications   aspirin 325 MG tablet     TAKE these medications   apixaban 5 MG Tabs tablet Commonly known as:  ELIQUIS Take 2 tablets (10 mg total) 2 (two) times daily for 5 days by mouth.   apixaban 5 MG Tabs tablet Commonly known as:  ELIQUIS Take 1 tablet (5 mg total) 2 (two) times daily by mouth. Start taking on:  10/26/2017   atorvastatin 20 MG tablet Commonly known as:  LIPITOR TAKE 1 TABLET (20 MG TOTAL) BY MOUTH DAILY.   esomeprazole 40 MG capsule Commonly known as:  NEXIUM TAKE ONE CAPSULE BY MOUTH EVERY DAY   fludrocortisone 0.1 MG tablet Commonly known as:  FLORINEF Take 1 tablet (0.1 mg total) daily by mouth.   glipiZIDE 2.5 MG 24 hr tablet Commonly known as:  GLUCOTROL XL Take 1 tablet (2.5 mg total) by mouth daily with breakfast.   glucose blood test strip Use as instructed   imipramine 25 MG tablet Commonly known as:  TOFRANIL TAKE 1 TABLET BY MOUTH EVERY DAY   metFORMIN 500 MG tablet Commonly known as:  GLUCOPHAGE Take 1 tablet (500 mg total) daily with breakfast by mouth.   ONE TOUCH ULTRA 2 w/Device Kit 1 Device 3 (three) times daily as needed by Does not apply route.   onetouch ultrasoft lancets Use as instructed What changed:  additional  instructions   VITAMIN B 12 PO Take 1,000 mcg by mouth every morning.      D/c education completed with patient/family including follow up instructions, medication list, d/c activities limitations if indicated, with other d/c instructions as indicated by MD - patient able to verbalize understanding, all questions fully answered.   Patient instructed to return to ED, call 911, or call MD for any changes in condition.   Patient escorted via [place], and D/C home via private auto.  Dreama Saa 10/20/2017 4:02 PM

## 2017-10-20 NOTE — Progress Notes (Signed)
Patient provided with 30 day Eliquis card. No other CM needs identified. CM signing off.

## 2017-10-22 ENCOUNTER — Telehealth: Payer: Self-pay | Admitting: *Deleted

## 2017-10-22 NOTE — Telephone Encounter (Addendum)
Transition Care Management Follow-up Telephone Call  Per Discharge Summary: Admit date: 10/18/2017 Discharge date: 10/20/2017  Recommendations for Outpatient Follow-up:  1. Monitor blood pressures and adjust antihypertensives accordingly 2. Ensure patient is taking anticoagulant properly   Discharge Diagnoses:  Principal Problem:   Pulmonary embolism ([facility]) Active Problems:   Essential hypertension   Diabetes mellitus with neuropathy [facility])   Discharge Condition: stable  Diet recommendation: diabetic diet/low sodium  --  How have you been since you were released from the hospital? Daughter reports they got home late Saturday and patient slept most of the day Sunday. Her systolic BP was 436 last night so daughter gave her a dose of amlodipine 5 mg, which was dc'd by PCP at last office visit. This morning her systolic BP was in the 067P. Advised daughter to continue to hold amlodipine until appointment tomorrow.   Do you understand why you were in the hospital? yes    Do you understand the discharge instructions? yes   Where were you discharged to? Home   Items Reviewed:  Medications reviewed: yes  Allergies reviewed: yes  Dietary changes reviewed: yes  Referrals reviewed: no, none made per daughter   Functional Questionnaire:   Activities of Daily Living (ADLs):   She states they are independent in the following: ambulation, feeding, continence and toileting States they require assistance with the following: bathing and hygiene, grooming and dressing. Daughter is assisting with ADLs.   Any transportation issues/concerns?: no  Any patient concerns? yes 1) Daughter would like to know if they should restart amlodipine.  2) She is interested in PT referral and states she has previously discussed this with Dr. Raliegh Ip.  3) They met w/ [facility] social worker in hospital and would like referral to [facility] for medication management.  4) Daughter has also noticed that the  patient has been increasingly confused lately and is not sure if this is related to hospitalization or something else. Discussed w/ daughter that delirium in common in hospitalized elderly adults and that we will want to monitor her to make sure she returns to baseline.  5) Pt and daughter interested in completing advance directives. Will leave paperwork w/ PCP's assistant to give to patient at appt tomorrow.   Confirmed importance and date/time of follow-up visits scheduled yes  Provider Appointment booked with Dr. Bluford Kaufmann, 10/23/17 @ 2pm  Confirmed with patient if condition begins to worsen call PCP or go to the ER.  Patient was given the office number and encouraged to call back with question or concerns.  : yes

## 2017-10-23 ENCOUNTER — Other Ambulatory Visit: Payer: Self-pay | Admitting: Internal Medicine

## 2017-10-23 ENCOUNTER — Encounter: Payer: Self-pay | Admitting: Internal Medicine

## 2017-10-23 ENCOUNTER — Ambulatory Visit: Payer: Medicare Other | Admitting: Internal Medicine

## 2017-10-23 ENCOUNTER — Ambulatory Visit (INDEPENDENT_AMBULATORY_CARE_PROVIDER_SITE_OTHER): Payer: Medicare Other | Admitting: Internal Medicine

## 2017-10-23 VITALS — BP 120/70 | HR 136 | Temp 97.7°F | Ht 63.0 in | Wt 169.6 lb

## 2017-10-23 DIAGNOSIS — I2782 Chronic pulmonary embolism: Secondary | ICD-10-CM | POA: Diagnosis not present

## 2017-10-23 DIAGNOSIS — E0840 Diabetes mellitus due to underlying condition with diabetic neuropathy, unspecified: Secondary | ICD-10-CM | POA: Diagnosis not present

## 2017-10-23 DIAGNOSIS — I10 Essential (primary) hypertension: Secondary | ICD-10-CM | POA: Diagnosis not present

## 2017-10-23 MED ORDER — ONETOUCH ULTRASOFT LANCETS MISC: 12 refills | Status: AC

## 2017-10-23 MED ORDER — GLUCOSE BLOOD VI STRP: ORAL_STRIP | 12 refills | Status: AC

## 2017-10-23 MED ORDER — ONETOUCH ULTRA 2 W/DEVICE KIT: 1.0000 | PACK | Freq: Three times a day (TID) | 0 refills | Status: AC

## 2017-10-23 NOTE — Patient Instructions (Signed)
  Hold Florinef Hold amlodipine  Eliquis 5 mg twice daily  Please check your blood pressure on a regular basis.    Return in 2 weeks for follow-up  Keep a diary of home blood pressure readings

## 2017-10-23 NOTE — Progress Notes (Signed)
Subjective:    Patient ID: [name], female    DOB: 1934/04/06, 83 y.o.   MRN: [id number]  HPI  Admit date: 10/18/2017 Discharge date: 10/20/2017  Recommendations for Outpatient Follow-up:  1. Monitor blood pressures and adjust antihypertensives accordingly 2. Ensure patient is taking anticoagulant properly   Discharge Diagnoses:  Principal Problem:   Pulmonary embolism (HCC) Active Problems:   Essential hypertension   Diabetes mellitus with neuropathy ([place])  83 year old patient who is seen today following a recent hospital discharge 3 days ago. She was seen 2 weeks ago after a several month absence with weakness and hypotension.  She was noted to have associated tachycardia.  Blood pressure medications were discontinued and screening lab was unremarkable. She was seen in follow-up 2 days prior to her admission with persistent hypotension in spite of discontinuation of medications she continued to have weakness. Further laboratory evaluation revealed an elevated d-dimer and she was scheduled for an outpatient chest CT scan that confirmed a small pulmonary emboli.  She was admitted to the hospital and also noted to have a lower extremity DVT.  She has completed 5 days of Eliquis 10 mg twice daily She feels improved but still quite weak.  Hospital records reviewed and at times blood pressure was stable with a fairly normal pulse. She remains off antihypertensive medication She has a history of type 2 diabetes controlled with oral medications.  Complications include a peripheral neuropathy. Prior to her recent hospital admission she was placed on Florinef due to persistent hypotension.  Cortisol level was normal.  It was felt that the patient had a diabetic autonomic insufficiency as a cause of her orthostatic hypotension.  Past Medical History:  Diagnosis Date  . Aortic stenosis   . CVA (cerebral infarction)   . Depression   . Diabetes mellitus   . GERD (gastroesophageal  reflux disease)   . Hyperlipidemia   . Hypertension   . Osteoarthritis      Social History   Socioeconomic History  . Marital status: Widowed    Spouse name: Not on file  . Number of children: Not on file  . Years of education: Not on file  . Highest education level: Not on file  Social Needs  . Financial resource strain: Not on file  . Food insecurity - worry: Not on file  . Food insecurity - inability: Not on file  . Transportation needs - medical: Not on file  . Transportation needs - non-medical: Not on file  Occupational History  . Not on file  Tobacco Use  . Smoking status: Former Smoker    Last attempt to quit: 02/07/1981    Years since quitting: 36.7  . Smokeless tobacco: Never Used  Substance and Sexual Activity  . Alcohol use: No  . Drug use: No  . Sexual activity: Not on file  Other Topics Concern  . Not on file  Social History Narrative  . Not on file    Past Surgical History:  Procedure Laterality Date  . ABDOMINAL HYSTERECTOMY     fibroids, no cancer  . FRACTURE SURGERY     left leg  . JOINT REPLACEMENT     left and right knee  . ROTATOR CUFF REPAIR    . TEMPOROMANDIBULAR JOINT SURGERY      Family History  Problem Relation Age of Onset  . Dementia Mother   . Hypertension Mother   . Heart attack Father   . Heart failure Sister     Allergies  Allergen Reactions  . Cymbalta [Duloxetine Hcl] Anaphylaxis  . Oxycodone Other (See Comments)    Nightmares  . Ramipril Other (See Comments)    REACTION: difficulty breathing  . Hydrochlorothiazide Rash  . Telmisartan-Hctz Rash    Current Outpatient Medications on File Prior to Visit  Medication Sig Dispense Refill  . atorvastatin (LIPITOR) 20 MG tablet TAKE 1 TABLET (20 MG TOTAL) BY MOUTH DAILY. 90 tablet 1  . Cyanocobalamin (VITAMIN B 12 PO) Take 1,000 mcg by mouth every morning.     Marland Kitchen esomeprazole (NEXIUM) 40 MG capsule TAKE ONE CAPSULE BY MOUTH EVERY DAY 30 capsule 0  . fludrocortisone  (FLORINEF) 0.1 MG tablet Take 1 tablet (0.1 mg total) daily by mouth. 90 tablet 3  . glipiZIDE (GLUCOTROL XL) 2.5 MG 24 hr tablet Take 1 tablet (2.5 mg total) by mouth daily with breakfast. 60 tablet 0  . imipramine (TOFRANIL) 25 MG tablet TAKE 1 TABLET BY MOUTH EVERY DAY 90 tablet 1  . metFORMIN (GLUCOPHAGE) 500 MG tablet Take 1 tablet (500 mg total) daily with breakfast by mouth. 180 tablet 3  . amLODipine (NORVASC) 5 MG tablet Take 5 mg daily as needed by mouth.   3  . [START ON 10/26/2017] apixaban (ELIQUIS) 5 MG TABS tablet Take 1 tablet (5 mg total) 2 (two) times daily by mouth. (Patient not taking: Reported on 10/23/2017) 60 tablet 0   No current facility-administered medications on file prior to visit.     BP 120/70 (BP Location: Left Arm, Patient Position: Sitting, Cuff Size: Normal)   Pulse (!) 136   Temp 97.7 F (36.5 C) (Oral)   Ht 5\' 3"  (1.6 m)   Wt 169 lb 9.6 oz (76.9 kg)   SpO2 94%   BMI 30.04 kg/m        Review of Systems  HENT: Negative for congestion, dental problem, hearing loss, rhinorrhea, sinus pressure, sore throat and tinnitus.   Eyes: Negative for pain, discharge and visual disturbance.  Respiratory: Negative for cough and shortness of breath.   Cardiovascular: Negative for chest pain, palpitations and leg swelling.  Gastrointestinal: Negative for abdominal distention, abdominal pain, blood in stool, constipation, diarrhea, nausea and vomiting.  Genitourinary: Negative for difficulty urinating, dysuria, flank pain, frequency, hematuria, pelvic pain, urgency, vaginal bleeding, vaginal discharge and vaginal pain.  Musculoskeletal: Negative for arthralgias, gait problem and joint swelling.  Skin: Negative for rash.  Neurological: Positive for dizziness and weakness. Negative for syncope, speech difficulty, numbness and headaches.  Hematological: Negative for adenopathy.  Psychiatric/Behavioral: Negative for agitation, behavioral problems and dysphoric mood.  The patient is not nervous/anxious.        Objective:   Physical Exam  Constitutional: She is oriented to person, place, and time. She appears well-developed and well-nourished.  Blood pressure 120/70 sitting Blood pressure 90/60 standing  Pulse rate 120  HENT:  Head: Normocephalic.  Right Ear: External ear normal.  Left Ear: External ear normal.  Mouth/Throat: Oropharynx is clear and moist.  Eyes: Conjunctivae and EOM are normal. Pupils are equal, round, and reactive to light.  Neck: Normal range of motion. Neck supple. No thyromegaly present.  Cardiovascular: Normal rate, regular rhythm, normal heart sounds and intact distal pulses.  Pulmonary/Chest: Effort normal and breath sounds normal.  Abdominal: Soft. Bowel sounds are normal. She exhibits no mass. There is no tenderness.  Musculoskeletal: Normal range of motion.  Lymphadenopathy:    She has no cervical adenopathy.  Neurological: She is alert and oriented to person, place,  and time.  Skin: Skin is warm and dry. No rash noted.  Psychiatric: She has a normal mood and affect. Her behavior is normal.          Assessment & Plan:   Status post recent hospitalization for pulmonary emboli.  We will continue anticoagulation for minimum 6 months Orthostatic hypotension.  Blood pressure medicines presently on hold.  The daughter has noticed an occasional high blood pressure readings and she did give a single amlodipine dose since her hospital discharge.  Florinef will be discontinued and she will be closely monitored.  Suspect this may be a diabetic autonomic dysfunction.  Doubt the pulmonary emboli were a significant factor with the hypotension.  Review of her office visit in February of this year also revealed some hypotension at that time Chronic anticoagulation History of hypertension.  Hold antihypertensive medications.  The patient has been asked to track her blood pressure frequently and to keep a diary of her blood pressure  and associated symptoms.  Will recheck in 2 months or sooner if needed Florinef will be discontinued  Nyoka Cowden

## 2017-10-25 ENCOUNTER — Other Ambulatory Visit: Payer: Self-pay

## 2017-10-25 ENCOUNTER — Encounter (HOSPITAL_COMMUNITY): Payer: Self-pay

## 2017-10-25 ENCOUNTER — Encounter: Payer: Self-pay | Admitting: Internal Medicine

## 2017-10-25 ENCOUNTER — Observation Stay (HOSPITAL_COMMUNITY)
Admission: EM | Admit: 2017-10-25 | Discharge: 2017-10-27 | Disposition: A | Discharge status: Home / Self Care | Payer: Medicare Other | Attending: Internal Medicine | Admitting: Internal Medicine

## 2017-10-25 ENCOUNTER — Other Ambulatory Visit: Payer: Self-pay | Admitting: Internal Medicine

## 2017-10-25 DIAGNOSIS — I491 Atrial premature depolarization: Secondary | ICD-10-CM | POA: Diagnosis not present

## 2017-10-25 DIAGNOSIS — Z8673 Personal history of transient ischemic attack (TIA), and cerebral infarction without residual deficits: Secondary | ICD-10-CM | POA: Diagnosis not present

## 2017-10-25 DIAGNOSIS — E875 Hyperkalemia: Secondary | ICD-10-CM | POA: Diagnosis not present

## 2017-10-25 DIAGNOSIS — I10 Essential (primary) hypertension: Secondary | ICD-10-CM | POA: Insufficient documentation

## 2017-10-25 DIAGNOSIS — F329 Major depressive disorder, single episode, unspecified: Secondary | ICD-10-CM | POA: Insufficient documentation

## 2017-10-25 DIAGNOSIS — M1990 Unspecified osteoarthritis, unspecified site: Secondary | ICD-10-CM | POA: Insufficient documentation

## 2017-10-25 DIAGNOSIS — K219 Gastro-esophageal reflux disease without esophagitis: Secondary | ICD-10-CM | POA: Diagnosis not present

## 2017-10-25 DIAGNOSIS — Z66 Do not resuscitate: Secondary | ICD-10-CM | POA: Diagnosis not present

## 2017-10-25 DIAGNOSIS — E0840 Diabetes mellitus due to underlying condition with diabetic neuropathy, unspecified: Secondary | ICD-10-CM

## 2017-10-25 DIAGNOSIS — M40209 Unspecified kyphosis, site unspecified: Secondary | ICD-10-CM | POA: Insufficient documentation

## 2017-10-25 DIAGNOSIS — R42 Dizziness and giddiness: Secondary | ICD-10-CM | POA: Insufficient documentation

## 2017-10-25 DIAGNOSIS — I350 Nonrheumatic aortic (valve) stenosis: Secondary | ICD-10-CM | POA: Insufficient documentation

## 2017-10-25 DIAGNOSIS — Z79899 Other long term (current) drug therapy: Secondary | ICD-10-CM | POA: Diagnosis not present

## 2017-10-25 DIAGNOSIS — I2699 Other pulmonary embolism without acute cor pulmonale: Secondary | ICD-10-CM | POA: Diagnosis not present

## 2017-10-25 DIAGNOSIS — Z87891 Personal history of nicotine dependence: Secondary | ICD-10-CM | POA: Insufficient documentation

## 2017-10-25 DIAGNOSIS — I700 Atherosclerosis of aorta: Secondary | ICD-10-CM | POA: Diagnosis not present

## 2017-10-25 DIAGNOSIS — Z86711 Personal history of pulmonary embolism: Secondary | ICD-10-CM | POA: Diagnosis not present

## 2017-10-25 DIAGNOSIS — R030 Elevated blood-pressure reading, without diagnosis of hypertension: Secondary | ICD-10-CM | POA: Diagnosis not present

## 2017-10-25 DIAGNOSIS — R6889 Other general symptoms and signs: Secondary | ICD-10-CM

## 2017-10-25 DIAGNOSIS — Z7984 Long term (current) use of oral hypoglycemic drugs: Secondary | ICD-10-CM | POA: Diagnosis not present

## 2017-10-25 DIAGNOSIS — Z7901 Long term (current) use of anticoagulants: Secondary | ICD-10-CM | POA: Insufficient documentation

## 2017-10-25 DIAGNOSIS — E1140 Type 2 diabetes mellitus with diabetic neuropathy, unspecified: Secondary | ICD-10-CM | POA: Insufficient documentation

## 2017-10-25 DIAGNOSIS — E785 Hyperlipidemia, unspecified: Secondary | ICD-10-CM | POA: Insufficient documentation

## 2017-10-25 DIAGNOSIS — Z8679 Personal history of other diseases of the circulatory system: Secondary | ICD-10-CM

## 2017-10-25 DIAGNOSIS — I951 Orthostatic hypotension: Secondary | ICD-10-CM

## 2017-10-25 DIAGNOSIS — E1151 Type 2 diabetes mellitus with diabetic peripheral angiopathy without gangrene: Secondary | ICD-10-CM | POA: Insufficient documentation

## 2017-10-25 DIAGNOSIS — E876 Hypokalemia: Secondary | ICD-10-CM | POA: Diagnosis not present

## 2017-10-25 DIAGNOSIS — E878 Other disorders of electrolyte and fluid balance, not elsewhere classified: Secondary | ICD-10-CM | POA: Diagnosis present

## 2017-10-25 DIAGNOSIS — R51 Headache: Secondary | ICD-10-CM | POA: Diagnosis not present

## 2017-10-25 HISTORY — DX: Acute embolism and thrombosis of unspecified deep veins of unspecified lower extremity: I82.409

## 2017-10-25 HISTORY — DX: Other pulmonary embolism without acute cor pulmonale: I26.99

## 2017-10-25 NOTE — ED Triage Notes (Signed)
Pt BIB family. They report that she was released from the hospital last Saturday after being admitted for a PE. Tonight, her family got a BP of 194/114. They state that her pressures have been up and down all day, but that one was the highest. A&Ox4, but patient states that she feels like she is in a fog.

## 2017-10-26 ENCOUNTER — Encounter (HOSPITAL_COMMUNITY): Payer: Self-pay | Admitting: Emergency Medicine

## 2017-10-26 ENCOUNTER — Emergency Department (HOSPITAL_COMMUNITY): Payer: Medicare Other

## 2017-10-26 DIAGNOSIS — E878 Other disorders of electrolyte and fluid balance, not elsewhere classified: Secondary | ICD-10-CM | POA: Diagnosis not present

## 2017-10-26 DIAGNOSIS — R6889 Other general symptoms and signs: Secondary | ICD-10-CM | POA: Diagnosis not present

## 2017-10-26 DIAGNOSIS — E876 Hypokalemia: Secondary | ICD-10-CM | POA: Diagnosis not present

## 2017-10-26 DIAGNOSIS — R51 Headache: Secondary | ICD-10-CM | POA: Diagnosis not present

## 2017-10-26 LAB — CBC WITH DIFFERENTIAL/PLATELET
BASOS ABS: 0 10*3/uL (ref 0.0–0.1)
Basophils Relative: 0 %
EOS PCT: 1 %
Eosinophils Absolute: 0.1 10*3/uL (ref 0.0–0.7)
HEMATOCRIT: 35.8 % — AB (ref 36.0–46.0)
Hemoglobin: 11.9 g/dL — ABNORMAL LOW (ref 12.0–15.0)
LYMPHS PCT: 25 %
Lymphs Abs: 1.7 10*3/uL (ref 0.7–4.0)
MCH: 29.7 pg (ref 26.0–34.0)
MCHC: 33.2 g/dL (ref 30.0–36.0)
MCV: 89.3 fL (ref 78.0–100.0)
MONO ABS: 0.8 10*3/uL (ref 0.1–1.0)
MONOS PCT: 12 %
NEUTROS ABS: 4.2 10*3/uL (ref 1.7–7.7)
Neutrophils Relative %: 62 %
PLATELETS: 172 10*3/uL (ref 150–400)
RBC: 4.01 MIL/uL (ref 3.87–5.11)
RDW: 13.3 % (ref 11.5–15.5)
WBC: 6.8 10*3/uL (ref 4.0–10.5)

## 2017-10-26 LAB — MAGNESIUM
MAGNESIUM: 1.2 mg/dL — AB (ref 1.7–2.4)
MAGNESIUM: 1.8 mg/dL (ref 1.7–2.4)

## 2017-10-26 LAB — CBC
HCT: 34.6 % — ABNORMAL LOW (ref 36.0–46.0)
HEMOGLOBIN: 11.4 g/dL — AB (ref 12.0–15.0)
MCH: 29.5 pg (ref 26.0–34.0)
MCHC: 32.9 g/dL (ref 30.0–36.0)
MCV: 89.6 fL (ref 78.0–100.0)
Platelets: 173 10*3/uL (ref 150–400)
RBC: 3.86 MIL/uL — ABNORMAL LOW (ref 3.87–5.11)
RDW: 13.5 % (ref 11.5–15.5)
WBC: 7.3 10*3/uL (ref 4.0–10.5)

## 2017-10-26 LAB — I-STAT CHEM 8, ED
BUN: 17 mg/dL (ref 6–20)
CALCIUM ION: 1.06 mmol/L — AB (ref 1.15–1.40)
CREATININE: 1.2 mg/dL — AB (ref 0.44–1.00)
Chloride: 100 mmol/L — ABNORMAL LOW (ref 101–111)
GLUCOSE: 174 mg/dL — AB (ref 65–99)
HCT: 37 % (ref 36.0–46.0)
Hemoglobin: 12.6 g/dL (ref 12.0–15.0)
Potassium: 2.7 mmol/L — CL (ref 3.5–5.1)
Sodium: 140 mmol/L (ref 135–145)
TCO2: 25 mmol/L (ref 22–32)

## 2017-10-26 LAB — BASIC METABOLIC PANEL
ANION GAP: 7 (ref 5–15)
BUN: 17 mg/dL (ref 6–20)
CALCIUM: 8 mg/dL — AB (ref 8.9–10.3)
CO2: 27 mmol/L (ref 22–32)
Chloride: 105 mmol/L (ref 101–111)
Creatinine, Ser: 1.03 mg/dL — ABNORMAL HIGH (ref 0.44–1.00)
GFR calc Af Amer: 57 mL/min — ABNORMAL LOW (ref >60)
GFR, EST NON AFRICAN AMERICAN: 50 mL/min — AB (ref >60)
Glucose, Bld: 145 mg/dL — ABNORMAL HIGH (ref 65–99)
POTASSIUM: 3.1 mmol/L — AB (ref 3.5–5.1)
SODIUM: 139 mmol/L (ref 135–145)

## 2017-10-26 LAB — GLUCOSE, CAPILLARY
GLUCOSE-CAPILLARY: 138 mg/dL — AB (ref 65–99)
GLUCOSE-CAPILLARY: 158 mg/dL — AB (ref 65–99)
Glucose-Capillary: 282 mg/dL — ABNORMAL HIGH (ref 65–99)
Glucose-Capillary: 200 mg/dL — ABNORMAL HIGH (ref 65–99)

## 2017-10-26 LAB — URINALYSIS, ROUTINE W REFLEX MICROSCOPIC
Bilirubin Urine: NEGATIVE
Glucose, UA: >=500 mg/dL — AB
Ketones, ur: 5 mg/dL — AB
LEUKOCYTES UA: NEGATIVE
Nitrite: NEGATIVE
PROTEIN: NEGATIVE mg/dL
SPECIFIC GRAVITY, URINE: 1.013 (ref 1.005–1.030)
pH: 5 (ref 5.0–8.0)

## 2017-10-26 LAB — I-STAT TROPONIN, ED: Troponin i, poc: 0.01 ng/mL (ref 0.00–0.08)

## 2017-10-26 LAB — POTASSIUM: POTASSIUM: 2.6 mmol/L — AB (ref 3.5–5.1)

## 2017-10-26 MED ORDER — POTASSIUM CHLORIDE CRYS ER 20 MEQ PO TBCR
40.0000 meq | EXTENDED_RELEASE_TABLET | ORAL | Status: AC
  Administered 2017-10-26 (×3): 40 meq via ORAL
  Filled 2017-10-26 (×3): qty 2

## 2017-10-26 MED ORDER — APIXABAN 5 MG PO TABS
5.0000 mg | ORAL_TABLET | Freq: Two times a day (BID) | ORAL | Status: DC
  Administered 2017-10-26 – 2017-10-27 (×4): 5 mg via ORAL
  Filled 2017-10-26 (×5): qty 1

## 2017-10-26 MED ORDER — PREMIER PROTEIN SHAKE
11.0000 [oz_av] | Freq: Two times a day (BID) | ORAL | Status: DC
  Administered 2017-10-26 – 2017-10-27 (×2): 11 [oz_av] via ORAL
  Filled 2017-10-26 (×3): qty 325.31

## 2017-10-26 MED ORDER — GLIPIZIDE ER 2.5 MG PO TB24
2.5000 mg | ORAL_TABLET | Freq: Every day | ORAL | Status: DC
  Administered 2017-10-26 – 2017-10-27 (×2): 2.5 mg via ORAL
  Filled 2017-10-26 (×2): qty 1

## 2017-10-26 MED ORDER — POTASSIUM CHLORIDE CRYS ER 20 MEQ PO TBCR
40.0000 meq | EXTENDED_RELEASE_TABLET | Freq: Once | ORAL | Status: AC
  Administered 2017-10-26: 40 meq via ORAL
  Filled 2017-10-26: qty 2

## 2017-10-26 MED ORDER — ACETAMINOPHEN 650 MG RE SUPP: 650.0000 mg | Freq: Four times a day (QID) | RECTAL | Status: DC | PRN

## 2017-10-26 MED ORDER — MAGNESIUM SULFATE 2 GM/50ML IV SOLN
2.0000 g | Freq: Once | INTRAVENOUS | Status: AC
  Administered 2017-10-26: 2 g via INTRAVENOUS
  Filled 2017-10-26: qty 50

## 2017-10-26 MED ORDER — POTASSIUM CHLORIDE 10 MEQ/100ML IV SOLN
10.0000 meq | INTRAVENOUS | Status: AC
  Administered 2017-10-26 (×4): 10 meq via INTRAVENOUS
  Filled 2017-10-26 (×4): qty 100

## 2017-10-26 MED ORDER — ENSURE ENLIVE PO LIQD
237.0000 mL | Freq: Two times a day (BID) | ORAL | Status: DC
Start: 2017-10-26 — End: 2017-10-26
  Administered 2017-10-26: 237 mL via ORAL

## 2017-10-26 MED ORDER — INSULIN ASPART 100 UNIT/ML ~~LOC~~ SOLN
0.0000 [IU] | Freq: Three times a day (TID) | SUBCUTANEOUS | Status: DC
Start: 2017-10-26 — End: 2017-10-27
  Administered 2017-10-26: 5 [IU] via SUBCUTANEOUS
  Administered 2017-10-26: 2 [IU] via SUBCUTANEOUS
  Administered 2017-10-26: 1 [IU] via SUBCUTANEOUS
  Administered 2017-10-27: 2 [IU] via SUBCUTANEOUS

## 2017-10-26 MED ORDER — AMLODIPINE BESYLATE 5 MG PO TABS
2.5000 mg | ORAL_TABLET | Freq: Every day | ORAL | Status: DC
  Administered 2017-10-26 – 2017-10-27 (×2): 2.5 mg via ORAL
  Filled 2017-10-26 (×2): qty 1

## 2017-10-26 MED ORDER — ATORVASTATIN CALCIUM 20 MG PO TABS
20.0000 mg | ORAL_TABLET | Freq: Every day | ORAL | Status: DC
Start: 2017-10-26 — End: 2017-10-27
  Administered 2017-10-26: 20 mg via ORAL
  Filled 2017-10-26: qty 1

## 2017-10-26 MED ORDER — ONDANSETRON HCL 4 MG/2ML IJ SOLN: 4.0000 mg | Freq: Four times a day (QID) | INTRAMUSCULAR | Status: DC | PRN

## 2017-10-26 MED ORDER — FLUDROCORTISONE ACETATE 0.1 MG PO TABS
0.0500 mg | ORAL_TABLET | Freq: Two times a day (BID) | ORAL | Status: DC
  Administered 2017-10-27: 0.05 mg via ORAL
  Filled 2017-10-26: qty 0.5

## 2017-10-26 MED ORDER — ACETAMINOPHEN 325 MG PO TABS
650.0000 mg | ORAL_TABLET | Freq: Four times a day (QID) | ORAL | Status: DC | PRN
  Administered 2017-10-26 (×2): 650 mg via ORAL
  Filled 2017-10-26 (×2): qty 2

## 2017-10-26 MED ORDER — SODIUM CHLORIDE 0.9 % IV SOLN
INTRAVENOUS | Status: AC
  Administered 2017-10-26: 09:00:00 via INTRAVENOUS
  Filled 2017-10-26 (×3): qty 1000

## 2017-10-26 MED ORDER — IMIPRAMINE HCL 25 MG PO TABS
25.0000 mg | ORAL_TABLET | Freq: Every day | ORAL | Status: DC
  Administered 2017-10-26 – 2017-10-27 (×2): 25 mg via ORAL
  Filled 2017-10-26 (×2): qty 1

## 2017-10-26 MED ORDER — FLUDROCORTISONE ACETATE 0.1 MG PO TABS: 0.1000 mg | ORAL_TABLET | Freq: Every day | ORAL | Status: DC

## 2017-10-26 MED ORDER — PANTOPRAZOLE SODIUM 40 MG PO TBEC
40.0000 mg | DELAYED_RELEASE_TABLET | Freq: Every day | ORAL | Status: DC
  Administered 2017-10-26 – 2017-10-27 (×2): 40 mg via ORAL
  Filled 2017-10-26 (×2): qty 1

## 2017-10-26 MED ORDER — ONDANSETRON HCL 4 MG PO TABS: 4.0000 mg | ORAL_TABLET | Freq: Four times a day (QID) | ORAL | Status: DC | PRN

## 2017-10-26 MED ORDER — AMLODIPINE BESYLATE 5 MG PO TABS: 5.0000 mg | ORAL_TABLET | Freq: Every day | ORAL | Status: DC

## 2017-10-26 NOTE — ED Notes (Signed)
Patients daughter reports patients BP was elevated on Sunday and since. Also, concerns about patients higher than normal pulse. Pt reports she has been experiencing an headache, blurry vision, and dizziness.

## 2017-10-26 NOTE — Care Management Note (Signed)
Case Management Note  Patient Details  Name: Shealee Yordy MRN: [id number] Date of Birth: Jun 18, 1934  Subjective/Objective:  Received call from [facility] rep Katrina-they had reeived referral from pcp office PTA-patient/dtr in agreement to [facility] providing [place].                  Action/Plan:d/c home w/HHC. Expected Discharge Date:                  Expected Discharge Plan:  [place]  In-House Referral:     Discharge planning Services  CM Consult  Post Acute Care Choice:  Home Health([facility]-they received referral from pcp office PTA) Choice offered to:  Adult Children, Patient  DME Arranged:    DME Agency:     HH Arranged:  PT HH Agency:     Status of Service:  In process, will continue to follow  If discussed at Long Length of Stay Meetings, dates discussed:    Additional Comments:  Dessa Phi, RN 10/26/2017, 5:13 PM

## 2017-10-26 NOTE — Progress Notes (Signed)
Patient seen and examined. Admitted after midnight secondary to weakness, dizziness sensation and abnormal electrolytes. Feeling better overall today after IVF's. Still with orthostatic changes seen and with low K and Mg. Please refer to H&P written by Dr. Hal Hope for further info/details on admission.  Barton Dubois MD [phone]

## 2017-10-26 NOTE — ED Provider Notes (Signed)
[facility] DEPT Provider Note   CSN: [id number] Arrival date & time: 10/25/17  2031     History   Chief Complaint Chief Complaint  Patient presents with  . Hypertension    HPI Karmon Andis is a 83 y.o. female.  The history is provided by the patient.  Hypertension  The current episode started more than 1 week ago. The problem occurs constantly. The problem has been gradually worsening. Pertinent negatives include no chest pain, no abdominal pain and no shortness of breath. Associated symptoms comments: Head feels funny. Nothing aggravates the symptoms. Nothing relieves the symptoms. She has tried nothing for the symptoms. The treatment provided no relief.    Past Medical History:  Diagnosis Date  . Aortic stenosis   . CVA (cerebral infarction)   . Depression   . Diabetes mellitus   . GERD (gastroesophageal reflux disease)   . Hyperlipidemia   . Hypertension   . Osteoarthritis     Patient Active Problem List   Diagnosis Date Noted  . Pulmonary embolism ([place]) 10/18/2017  . Diabetes mellitus with neuropathy ([place]) 04/21/2016  . OTHER ANXIETY STATES 11/01/2007  . Aortic valve disorder 08/14/2007  . Osteoarthritis 08/14/2007  . VERTIGO 07/30/2007  . Dyslipidemia 10/21/2006  . Depression, recurrent ([place]) 10/21/2006  . Essential hypertension 10/21/2006  . GERD 10/21/2006  . History of cardiovascular disorder 10/21/2006    Past Surgical History:  Procedure Laterality Date  . ABDOMINAL HYSTERECTOMY     fibroids, no cancer  . FRACTURE SURGERY     left leg  . JOINT REPLACEMENT     left and right knee  . ROTATOR CUFF REPAIR    . TEMPOROMANDIBULAR JOINT SURGERY      OB History    No data available       Home Medications    Prior to Admission medications   Medication Sig Start Date End Date Taking? Authorizing Provider  amLODipine (NORVASC) 5 MG tablet Take 5 mg daily by mouth.  10/13/17  Yes [provider]    apixaban (ELIQUIS) 5 MG TABS tablet Take 1 tablet (5 mg total) 2 (two) times daily by mouth. 10/26/17  Yes Velvet Bathe, MD  atorvastatin (LIPITOR) 20 MG tablet TAKE 1 TABLET (20 MG TOTAL) BY MOUTH DAILY. 05/10/17  Yes Marletta Lor, MD  Cyanocobalamin (VITAMIN B 12 PO) Take 1,000 mcg by mouth every morning.    Yes [provider]  esomeprazole (NEXIUM) 40 MG capsule TAKE ONE CAPSULE BY MOUTH EVERY DAY 03/30/17  Yes Marletta Lor, MD  fludrocortisone (FLORINEF) 0.1 MG tablet Take 1 tablet (0.1 mg total) daily by mouth. 10/16/17  Yes Marletta Lor, MD  glipiZIDE (GLUCOTROL XL) 2.5 MG 24 hr tablet Take 1 tablet (2.5 mg total) by mouth daily with breakfast. 10/10/17  Yes Marletta Lor, MD  imipramine (TOFRANIL) 25 MG tablet TAKE 1 TABLET BY MOUTH EVERY DAY 05/23/17  Yes Marletta Lor, MD  metFORMIN (GLUCOPHAGE) 500 MG tablet Take 1 tablet (500 mg total) daily with breakfast by mouth. 10/16/17  Yes Marletta Lor, MD  Blood Glucose Monitoring Suppl (ONE TOUCH ULTRA 2) w/Device KIT 1 Device 3 (three) times daily before meals by Does not apply route. Dx :E11.9 10/23/17   Marletta Lor, MD  glucose blood test strip Use to check CBG TID. Dx: E11.9 10/23/17   Marletta Lor, MD  Lancets [facility] ULTRASOFT) lancets Use to check CBG TID. Dx : E11.9 10/23/17  Marletta Lor, MD    Family History Family History  Problem Relation Age of Onset  . Dementia Mother   . Hypertension Mother   . Heart attack Father   . Heart failure Sister     Social History Social History   Tobacco Use  . Smoking status: Former Smoker    Last attempt to quit: 02/07/1981    Years since quitting: 36.7  . Smokeless tobacco: Never Used  Substance Use Topics  . Alcohol use: No  . Drug use: No     Allergies   Cymbalta [duloxetine hcl]; Oxycodone; Ramipril; Hydrochlorothiazide; and Telmisartan-hctz   Review of Systems Review of Systems   Constitutional: Negative for diaphoresis.  Respiratory: Negative for shortness of breath.   Cardiovascular: Negative for chest pain.  Gastrointestinal: Negative for abdominal pain.  Neurological: Negative for facial asymmetry, weakness and numbness.  All other systems reviewed and are negative.    Physical Exam Updated Vital Signs BP (!) 158/94 (BP Location: Left Arm)   Pulse 88   Temp 98.1 F (36.7 C) (Oral)   Resp 18   SpO2 97%   Physical Exam  Constitutional: She is oriented to person, place, and time. She appears well-developed and well-nourished. No distress.  HENT:  Head: Normocephalic and atraumatic.  Nose: Nose normal.  Mouth/Throat: No oropharyngeal exudate.  Eyes: Conjunctivae are normal. Pupils are equal, round, and reactive to light.  Neck: Normal range of motion. Neck supple.  Cardiovascular: Normal rate, regular rhythm, normal heart sounds and intact distal pulses.  Pulmonary/Chest: Effort normal and breath sounds normal. No stridor. She has no wheezes. She has no rales.  Abdominal: Soft. Bowel sounds are normal. She exhibits no mass. There is no tenderness. There is no rebound and no guarding.  Musculoskeletal: Normal range of motion.  Neurological: She is alert and oriented to person, place, and time. She displays normal reflexes. She exhibits normal muscle tone. Coordination normal.  Skin: Skin is warm and dry. Capillary refill takes less than 2 seconds.  Psychiatric: She has a normal mood and affect.     ED Treatments / Results  Labs (all labs ordered are listed, but only abnormal results are displayed)  Results for orders placed or performed during the hospital encounter of 10/25/17  CBC with Differential/Platelet  Result Value Ref Range   WBC 6.8 4.0 - 10.5 K/uL   RBC 4.01 3.87 - 5.11 MIL/uL   Hemoglobin 11.9 (L) 12.0 - 15.0 g/dL   HCT 35.8 (L) 36.0 - 46.0 %   MCV 89.3 78.0 - 100.0 fL   MCH 29.7 26.0 - 34.0 pg   MCHC 33.2 30.0 - 36.0 g/dL   RDW  13.3 11.5 - 15.5 %   Platelets 172 150 - 400 K/uL   Neutrophils Relative % 62 %   Neutro Abs 4.2 1.7 - 7.7 K/uL   Lymphocytes Relative 25 %   Lymphs Abs 1.7 0.7 - 4.0 K/uL   Monocytes Relative 12 %   Monocytes Absolute 0.8 0.1 - 1.0 K/uL   Eosinophils Relative 1 %   Eosinophils Absolute 0.1 0.0 - 0.7 K/uL   Basophils Relative 0 %   Basophils Absolute 0.0 0.0 - 0.1 K/uL  Potassium  Result Value Ref Range   Potassium 2.6 (LL) 3.5 - 5.1 mmol/L  Magnesium  Result Value Ref Range   Magnesium 1.2 (L) 1.7 - 2.4 mg/dL  I-Stat Chem 8, ED  Result Value Ref Range   Sodium 140 135 - 145 mmol/L  Potassium 2.7 (LL) 3.5 - 5.1 mmol/L   Chloride 100 (L) 101 - 111 mmol/L   BUN 17 6 - 20 mg/dL   Creatinine, Ser 1.20 (H) 0.44 - 1.00 mg/dL   Glucose, Bld 174 (H) 65 - 99 mg/dL   Calcium, Ion 1.06 (L) 1.15 - 1.40 mmol/L   TCO2 25 22 - 32 mmol/L   Hemoglobin 12.6 12.0 - 15.0 g/dL   HCT 37.0 36.0 - 46.0 %   Comment NOTIFIED PHYSICIAN   I-Stat Troponin, ED (not at [facility])  Result Value Ref Range   Troponin i, poc 0.01 0.00 - 0.08 ng/mL   Comment 3           Ct Head Wo Contrast  Result Date: 10/26/2017 CLINICAL DATA:  Acute onset of headache, blurred vision and dizziness. High blood pressure. EXAM: CT HEAD WITHOUT CONTRAST TECHNIQUE: Contiguous axial images were obtained from the base of the skull through the vertex without intravenous contrast. COMPARISON:  CT of the head performed 03/22/2015 FINDINGS: Brain: No evidence of acute infarction, hemorrhage, hydrocephalus, extra-axial collection or mass lesion/mass effect. Prominence of the ventricles and sulci reflects mild cortical volume loss. Diffuse periventricular and subcortical white matter change likely reflects small vessel ischemic microangiopathy. Mild cerebellar atrophy is noted. The brainstem and fourth ventricle are within normal limits. The basal ganglia are unremarkable in appearance. The cerebral hemispheres demonstrate grossly normal  gray-white differentiation. No mass effect or midline shift is seen. Vascular: No hyperdense vessel or unexpected calcification. Skull: There is no evidence of fracture; visualized osseous structures are unremarkable in appearance. Sinuses/Orbits: The orbits are within normal limits. The paranasal sinuses and mastoid air cells are well-aerated. Other: No significant soft tissue abnormalities are seen. IMPRESSION: 1. No acute intracranial pathology seen on CT. 2. Mild cortical volume loss and diffuse small vessel ischemic microangiopathy. Electronically Signed   By: Garald Balding M.D.   On: 10/26/2017 01:58   Ct Angio Chest W/cm &/or Wo Cm  Result Date: 10/18/2017 CLINICAL DATA:  Increasing shortness of breath over several months EXAM: CT ANGIOGRAPHY CHEST WITH CONTRAST TECHNIQUE: Multidetector CT imaging of the chest was performed using the standard protocol during bolus administration of intravenous contrast. Multiplanar CT image reconstructions and MIPs were obtained to evaluate the vascular anatomy. CONTRAST:  80 cc Isovue 370 COMPARISON:  Chest x-ray of 09/16/2012 FINDINGS: Cardiovascular: Moderate thoracic aortic atherosclerosis is present. There are pulmonary emboli within branches of the right pulmonary artery to the right lower lobe. No additional pulmonary emboli are seen. There is moderate cardiomegaly present. No pericardial effusion is seen. Diffuse coronary artery calcifications are noted. The mid ascending thoracic aorta measures 36 mm in diameter pre Mediastinum/Nodes: There are small mediastinal lymph nodes, 1 of the larger being paratracheal just above the carina measuring 9 mm in short axis diameter. No definite mediastinal or hilar adenopathy is seen. Lungs/Pleura: On lung window images, only a single 3 mm nodule is noted in the left lower lobe of doubtful clinical significance in this age patient. No pneumonia is seen. No pleural effusion is noted. The central airway is patent Upper Abdomen:  The portion of the upper abdomen that is visualized is unremarkable. No calcified gallstones are seen. There may be a small hiatal hernia present. Musculoskeletal: The thoracic vertebrae are in normal alignment other than mild kyphosis. There are degenerative changes in the mid to lower thoracic spine. Review of the MIP images confirms the above findings. IMPRESSION: 1. Small pulmonary emboli are present within branches of  the right pulmonary artery to the right lower lobe. These emboli do not appear to be occlusive. No central emboli are seen. 2. Moderate thoracic aortic atherosclerosis. 3. Diffuse coronary artery calcifications. 4. This report will we called to the referring physician by the radiology assistant. Electronically Signed   By: Ivar Drape M.D.   On: 10/18/2017 11:38    Procedures Procedures (including critical care time)  Medications Ordered in ED Medications  magnesium sulfate IVPB 2 g 50 mL (not administered)  potassium chloride 10 mEq in 100 mL IVPB (not administered)  potassium chloride SA (K-DUR,KLOR-CON) CR tablet 40 mEq (not administered)      Final Clinical Impressions(s) / ED Diagnoses   Final diagnoses:  Hypokalemia  Hypomagnesemia  Blood pressure alteration    Will admit to medicine, urine is ordered and pending at this time   Tayli Buch, MD 10/26/17 0230

## 2017-10-26 NOTE — Care Management Obs Status (Signed)
[place] NOTIFICATION   Patient Details  Name: [name] MRN: [id number] Date of Birth: 1934-07-06   Medicare Observation Status Notification Given:  Yes    MahabirJuliann Pulse, RN 10/26/2017, 3:29 PM

## 2017-10-26 NOTE — ED Notes (Signed)
Attempted to call 4W twice with no answer.

## 2017-10-26 NOTE — Progress Notes (Signed)
Initial Nutrition Assessment  DOCUMENTATION CODES:   Obesity unspecified  INTERVENTION:    Provide Premier Protein BID, each supplement provides 160kcal and 30g protein.  RD to encourage PO intake  Monitor and supplement electrolytes as needed per MD descretion.  Recommend checking phosphorus level  NUTRITION DIAGNOSIS:   Inadequate oral intake related to poor appetite as evidenced by per patient/family report.  GOAL:   Patient will meet greater than or equal to 90% of their needs  MONITOR:   PO intake, Supplement acceptance, Weight trends, Labs  REASON FOR ASSESSMENT:   Malnutrition Screening Tool    ASSESSMENT:   Pt with PMH significant for HTN, HLD, HTN, DM, CVA, and aortic stenosis. Presents this admission with electrolyte abnormalities with severe hypokalemia and hypomagnesemia.     Spoke with pt and daughter at bedside.  Reports having loss of appetite for > 3 months prior to admission from unknown etiology. States she typically has two meals per day that consist of cereal for breakfast and meat with veggies for dinner.  Daughter pulled RD aside after interview.  Pt consumes sweets daily and family is trying to control the quantity.  A1c noted to be 7.6 this hospital stay.  Given pt's age spoke with daughter about letting her mother have sweets but not in excess.  Pt reports losing 20 lb in two months prior to admission. Records indicate pt has lost 9% of body weight in 7 months. Not significant for time frame.  Nutrition-Focused physical exam completed. See results below.   Medications reviewed and include: glipizide, SSI, KCl, NS with KCl @ 75 ml/hr Labs reviewed: K 3.1 (L) CBG 145-174 Mg 1.2 (L)   NUTRITION - FOCUSED PHYSICAL EXAM:    Most Recent Value  Orbital Region  No depletion  Upper Arm Region  No depletion  Thoracic and Lumbar Region  Unable to assess  Buccal Region  No depletion  Temple Region  No depletion  Clavicle Bone Region  No depletion   Clavicle and Acromion Bone Region  No depletion  Scapular Bone Region  Unable to assess  Dorsal Hand  No depletion  Patellar Region  No depletion  Anterior Thigh Region  No depletion  Posterior Calf Region  No depletion  Edema (RD Assessment)  None  Hair  Reviewed  Eyes  Reviewed  Mouth  Reviewed  Skin  Reviewed  Nails  Reviewed      Diet Order:  Diet heart healthy/carb modified Room service appropriate? Yes; Fluid consistency: Thin  EDUCATION NEEDS:   Education needs have been addressed  Skin:  Skin Assessment: Reviewed RN Assessment  Last BM:  10/19/17  Height:   Ht Readings from Last 1 Encounters:  10/26/17 5\' 3"  (1.6 m)    Weight:   Wt Readings from Last 1 Encounters:  10/26/17 171 lb 4.8 oz (77.7 kg)    Ideal Body Weight:  52.2 kg  BMI:  Body mass index is 30.34 kg/m.  Estimated Nutritional Needs:   Kcal:  1600-1800 kcal/day  Protein:  70-80 g/day  Fluid:  >1.6 L/day    Mariana Single RD, LDN Clinical Nutrition Pager # - [phone]

## 2017-10-26 NOTE — Care Management Note (Signed)
Case Management Note  Patient Details  Name: [name] MRN: [id number] Date of Birth: 06-Nov-1934  Subjective/Objective: 83 y/o f admitted w/electrolyte abnormality. From home. PT-recc HHPT. Provided patient/dtr w/HHC agency list-await choice.                    Action/Plan:d/c home w/HHC.   Expected Discharge Date:                  Expected Discharge Plan:  [place]  In-House Referral:     Discharge planning Services  CM Consult  Post Acute Care Choice:    Choice offered to:  Adult Children, Patient  DME Arranged:    DME Agency:     HH Arranged:  PT HH Agency:     Status of Service:  In process, will continue to follow  If discussed at Long Length of Stay Meetings, dates discussed:    Additional Comments:  Dessa Phi, RN 10/26/2017, 3:30 PM

## 2017-10-26 NOTE — ED Notes (Signed)
Pt in CT.

## 2017-10-26 NOTE — ED Notes (Signed)
Patient ambulated to restroom and back to stretcher with a walker but was unable to urinate.

## 2017-10-26 NOTE — ED Notes (Signed)
Notified EDP,Palumbo,MD., pt. I-stat Chem 8 results potassium 2.7 and RN,Joanna made aware.

## 2017-10-26 NOTE — H&P (Signed)
History and Physical    Siana Panameno NMM:[id number] DOB: [birth year] DOA: 10/25/2017  PCP: Marletta Lor, MD  Patient coming from: Home.  Chief Complaint: Fluctuating blood pressure.  HPI: [name] is a 83 y.o. female with history of hypertension, diabetes mellitus was recently admitted for pulmonary embolism discharged home last week was brought to the ER after patient's daughter found that patient blood pressure was fluctuating from admission yesterday.  History morning patient also found with dizzy when walking.  Did not lose consciousness denies any chest pain or shortness of breath.  Did have some loose stools but no diarrhea and loose stools only once a day last few days.  ED Course: In the ER patient was orthostatic with systolic blood pressure dropping by 50 points on standing.  Patient's electrolytes show hypokalemia and hypomagnesemia.  Patient during last admission actually was given Kayexalate for hyperkalemia.  Patient has stopped taking Florinef and antihypertensives for the last 2 weeks as recommended by patient's primary care physician.  Patient was given potassium and magnesium replacement and admitted for further observation.  Review of Systems: As per HPI, rest all negative.   Past Medical History:  Diagnosis Date  . Aortic stenosis   . CVA (cerebral infarction)   . Depression   . Diabetes mellitus   . DVT (deep venous thrombosis) ([place])   . GERD (gastroesophageal reflux disease)   . Hyperlipidemia   . Hypertension   . Osteoarthritis   . Pulmonary embolism [facility])     Past Surgical History:  Procedure Laterality Date  . ABDOMINAL HYSTERECTOMY     fibroids, no cancer  . FRACTURE SURGERY     left leg  . JOINT REPLACEMENT     left and right knee  . ROTATOR CUFF REPAIR    . TEMPOROMANDIBULAR JOINT SURGERY       reports that she quit smoking about 36 years ago. she has never used smokeless tobacco. She reports that she does not drink alcohol or  use drugs.  Allergies  Allergen Reactions  . Cymbalta [Duloxetine Hcl] Anaphylaxis  . Oxycodone Other (See Comments)    Nightmares  . Ramipril Other (See Comments)    REACTION: difficulty breathing  . Hydrochlorothiazide Rash  . Telmisartan-Hctz Rash    Family History  Problem Relation Age of Onset  . Dementia Mother   . Hypertension Mother   . Heart attack Father   . Heart failure Sister     Prior to Admission medications   Medication Sig Start Date End Date Taking? Authorizing Provider  amLODipine (NORVASC) 5 MG tablet Take 5 mg daily by mouth.  10/13/17  Yes [provider]  apixaban (ELIQUIS) 5 MG TABS tablet Take 1 tablet (5 mg total) 2 (two) times daily by mouth. 10/26/17  Yes Velvet Bathe, MD  atorvastatin (LIPITOR) 20 MG tablet TAKE 1 TABLET (20 MG TOTAL) BY MOUTH DAILY. 05/10/17  Yes Marletta Lor, MD  Cyanocobalamin (VITAMIN B 12 PO) Take 1,000 mcg by mouth every morning.    Yes [provider]  esomeprazole (NEXIUM) 40 MG capsule TAKE ONE CAPSULE BY MOUTH EVERY DAY 03/30/17  Yes Marletta Lor, MD  fludrocortisone (FLORINEF) 0.1 MG tablet Take 1 tablet (0.1 mg total) daily by mouth. 10/16/17  Yes Marletta Lor, MD  glipiZIDE (GLUCOTROL XL) 2.5 MG 24 hr tablet Take 1 tablet (2.5 mg total) by mouth daily with breakfast. 10/10/17  Yes Marletta Lor, MD  imipramine (TOFRANIL) 25 MG tablet TAKE 1  TABLET BY MOUTH EVERY DAY 05/23/17  Yes Marletta Lor, MD  metFORMIN (GLUCOPHAGE) 500 MG tablet Take 1 tablet (500 mg total) daily with breakfast by mouth. 10/16/17  Yes Marletta Lor, MD  Blood Glucose Monitoring Suppl (ONE TOUCH ULTRA 2) w/Device KIT 1 Device 3 (three) times daily before meals by Does not apply route. Dx :E11.9 10/23/17   Marletta Lor, MD  glucose blood test strip Use to check CBG TID. Dx: E11.9 10/23/17   Marletta Lor, MD  Lancets [facility] ULTRASOFT) lancets Use to check CBG TID. Dx : E11.9  10/23/17   Marletta Lor, MD    Physical Exam: Vitals:   10/25/17 2339 10/26/17 0200 10/26/17 0300 10/26/17 0410  BP: (!) 158/94 (!) 159/97  (!) 173/93  Pulse: 88 91  91  Resp: 18 19    Temp:   97.9 F (36.6 C)   TempSrc:      SpO2: 97% 97%    Weight:    77.7 kg (171 lb 4.8 oz)  Height:    5' 3"  (1.6 m)      Constitutional: Moderately built and nourished. Vitals:   10/25/17 2339 10/26/17 0200 10/26/17 0300 10/26/17 0410  BP: (!) 158/94 (!) 159/97  (!) 173/93  Pulse: 88 91  91  Resp: 18 19    Temp:   97.9 F (36.6 C)   TempSrc:      SpO2: 97% 97%    Weight:    77.7 kg (171 lb 4.8 oz)  Height:    5' 3"  (1.6 m)   Eyes: Anicteric no pallor. ENMT: No discharge from the ears eyes nose or mouth. Neck: No mass felt.  No neck rigidity. Respiratory: No rhonchi or crepitations. Cardiovascular: S1-S2 heard no murmurs appreciated. Abdomen: Soft nontender bowel sounds present. Musculoskeletal: No edema.  No joint effusion. Skin: No rash. Neurologic: Alert awake oriented to time place and person.  Moves all extremities. Psychiatric: Appears normal.   normal affect.   Labs on Admission: I have personally reviewed following labs and imaging studies  CBC: Recent Labs  Lab 10/26/17 0000 10/26/17 0028  WBC 6.8  --   NEUTROABS 4.2  --   HGB 11.9* 12.6  HCT 35.8* 37.0  MCV 89.3  --   PLT 172  --    Basic Metabolic Panel: Recent Labs  Lab 10/19/17 1708 10/26/17 0021 10/26/17 0028  NA  --   --  140  K 3.6 2.6* 2.7*  CL  --   --  100*  GLUCOSE  --   --  174*  BUN  --   --  17  CREATININE  --   --  1.20*  MG  --  1.2*  --    GFR: Estimated Creatinine Clearance: 36.3 mL/min (A) (by C-G formula based on SCr of 1.2 mg/dL (H)). Liver Function Tests: No results for input(s): AST, ALT, ALKPHOS, BILITOT, PROT, ALBUMIN in the last 168 hours. No results for input(s): LIPASE, AMYLASE in the last 168 hours. No results for input(s): AMMONIA in the last 168  hours. Coagulation Profile: No results for input(s): INR, PROTIME in the last 168 hours. Cardiac Enzymes: No results for input(s): CKTOTAL, CKMB, CKMBINDEX, TROPONINI in the last 168 hours. BNP (last 3 results) No results for input(s): PROBNP in the last 8760 hours. HbA1C: No results for input(s): HGBA1C in the last 72 hours. CBG: Recent Labs  Lab 10/19/17 1220 10/19/17 1633 10/19/17 2047 10/20/17 0748 10/20/17 1151  GLUCAP  169* 143* 238* 142* 284*   Lipid Profile: No results for input(s): CHOL, HDL, LDLCALC, TRIG, CHOLHDL, LDLDIRECT in the last 72 hours. Thyroid Function Tests: No results for input(s): TSH, T4TOTAL, FREET4, T3FREE, THYROIDAB in the last 72 hours. Anemia Panel: No results for input(s): VITAMINB12, FOLATE, FERRITIN, TIBC, IRON, RETICCTPCT in the last 72 hours. Urine analysis:    Component Value Date/Time   COLORURINE YELLOW 03/22/2015 2212   APPEARANCEUR CLOUDY (A) 03/22/2015 2212   LABSPEC 1.017 03/22/2015 2212   PHURINE 5.0 03/22/2015 2212   GLUCOSEU 500 (A) 03/22/2015 2212   HGBUR NEGATIVE 03/22/2015 2212   HGBUR negative 08/21/2007 0839   BILIRUBINUR NEGATIVE 03/22/2015 2212   KETONESUR NEGATIVE 03/22/2015 2212   PROTEINUR NEGATIVE 03/22/2015 2212   UROBILINOGEN 0.2 03/22/2015 2212   NITRITE NEGATIVE 03/22/2015 2212   LEUKOCYTESUR NEGATIVE 03/22/2015 2212   Sepsis Labs: @LABRCNTIP (procalcitonin:4,lacticidven:4) )No results found for this or any previous visit (from the past 240 hour(s)).   Radiological Exams on Admission: Ct Head Wo Contrast  Result Date: 10/26/2017 CLINICAL DATA:  Acute onset of headache, blurred vision and dizziness. High blood pressure. EXAM: CT HEAD WITHOUT CONTRAST TECHNIQUE: Contiguous axial images were obtained from the base of the skull through the vertex without intravenous contrast. COMPARISON:  CT of the head performed 03/22/2015 FINDINGS: Brain: No evidence of acute infarction, hemorrhage, hydrocephalus, extra-axial  collection or mass lesion/mass effect. Prominence of the ventricles and sulci reflects mild cortical volume loss. Diffuse periventricular and subcortical white matter change likely reflects small vessel ischemic microangiopathy. Mild cerebellar atrophy is noted. The brainstem and fourth ventricle are within normal limits. The basal ganglia are unremarkable in appearance. The cerebral hemispheres demonstrate grossly normal gray-white differentiation. No mass effect or midline shift is seen. Vascular: No hyperdense vessel or unexpected calcification. Skull: There is no evidence of fracture; visualized osseous structures are unremarkable in appearance. Sinuses/Orbits: The orbits are within normal limits. The paranasal sinuses and mastoid air cells are well-aerated. Other: No significant soft tissue abnormalities are seen. IMPRESSION: 1. No acute intracranial pathology seen on CT. 2. Mild cortical volume loss and diffuse small vessel ischemic microangiopathy. Electronically Signed   By: Garald Balding M.D.   On: 10/26/2017 01:58    EKG: Independently reviewed.  Sinus tachycardia atrial premature complexes.  Assessment/Plan Principal Problem:   Electrolyte abnormality Active Problems:   Essential hypertension   Diabetes mellitus with neuropathy (HCC)   Pulmonary embolism (HCC)   Electrolyte imbalance   Blood pressure alteration    1. Electrolyte abnormalities with severe hypokalemia and hypomagnesemia -exact cause not known but patient states she did have some loose stools but only once a day for last few days.  At this time we will replace and recheck.  Monitor in telemetry. 2. Orthostatic hypotension probably causing near syncopal episodes -holding all antihypertensives will get physical therapy consult. 3. Diabetes mellitus type 2 -we will hold metformin and keep patient on sliding scale coverage.  Continue Glucotrol. 4. Recent PE on apixaban. 5. Mildly elevated renal function probably acute renal  failure -hydrate and recheck metabolic panel. 6. Hyperlipidemia on statins.  I have reviewed patient's old charts and labs.   DVT prophylaxis: Apixaban. Code Status: DNR. Family Communication: Patient's daughter. Disposition Plan: Home. Consults called: Physical therapy. Admission status: Observation.   Rise Patience MD [facility] Pager [phone].  If 7PM-7AM, please contact night-coverage [email or web address] Password [facility]  10/26/2017, 7:54 AM

## 2017-10-26 NOTE — Evaluation (Addendum)
Physical Therapy Evaluation Patient Details Name: [name] MRN: [id number] DOB: [birth year] Today's Date: 10/26/2017   History of Present Illness  83 y.o. female with history of hypertension, diabetes mellitus was recently admitted for pulmonary embolism discharged home; presents this admission for Electrolyte abnormalities with severe hypokalemia and hypomagnesemia  as well as orthostatic hypotension  Clinical Impression  Pt admitted with above diagnosis. Pt currently with functional limitations due to the deficits listed below (see PT Problem List).  Pt will benefit from skilled PT to increase their independence and safety with mobility to allow discharge to the venue listed below.  Pt with positive orthostatics earlier this morning however pt not dizzy with standing and ambulating in hallway.  Pt educated to lock her rollator at home and sit down if she feels sudden weakness or dizziness for safety.     Follow Up Recommendations Home health PT;Supervision for mobility/OOB    Equipment Recommendations  None recommended by PT    Recommendations for Other Services       Precautions / Restrictions Precautions Precautions: Fall Precaution Comments: orthostatic      Mobility  Bed Mobility Overal bed mobility: Modified Independent                Transfers Overall transfer level: Needs assistance Equipment used: Rolling walker (2 wheeled) Transfers: Sit to/from Stand Sit to Stand: Min guard         General transfer comment: utilizes UE to self assist, min/guard for safety due to orthostatic this morning, pt denies any dizziness  Ambulation/Gait Ambulation/Gait assistance: Min guard Ambulation Distance (Feet): 120 Feet Assistive device: Rolling walker (2 wheeled) Gait Pattern/deviations: Step-through pattern;Decreased stride length     General Gait Details: steady with RW, denies any dizziness, HR 126 bpm during ambulation  Stairs            Wheelchair  Mobility    Modified Rankin (Stroke Patients Only)       Balance Overall balance assessment: History of Falls(multiple falls on the stairs per pt)                                           Pertinent Vitals/Pain Pain Assessment: No/denies pain    Home Living Family/patient expects to be discharged to:: Private residence Living Arrangements: Children Available Help at Discharge: Family Type of Home: House Home Access: Stairs to enter   Technical brewer of Steps: 3-4 Home Layout: One level Home Equipment: Environmental consultant - 4 wheels      Prior Function Level of Independence: Independent with assistive device(s)         Comments: uses rollator     Hand Dominance        Extremity/Trunk Assessment        Lower Extremity Assessment Lower Extremity Assessment: Generalized weakness       Communication   Communication: HOH  Cognition Arousal/Alertness: Awake/alert Behavior During Therapy: WFL for tasks assessed/performed Overall Cognitive Status: Within Functional Limits for tasks assessed                                        General Comments      Exercises     Assessment/Plan    PT Assessment Patient needs continued PT services  PT Problem List Decreased strength;Decreased mobility;Decreased balance;Cardiopulmonary status  limiting activity       PT Treatment Interventions DME instruction;Gait training;Therapeutic exercise;Therapeutic activities;Functional mobility training;Balance training;Patient/family education    PT Goals (Current goals can be found in the Care Plan section)  Acute Rehab PT Goals PT Goal Formulation: With patient/family Time For Goal Achievement: 11/02/17 Potential to Achieve Goals: Good    Frequency Min 3X/week   Barriers to discharge        Co-evaluation               AM-PAC PT "6 Clicks" Daily Activity  Outcome Measure Difficulty turning over in bed (including adjusting  bedclothes, sheets and blankets)?: None Difficulty moving from lying on back to sitting on the side of the bed? : None Difficulty sitting down on and standing up from a chair with arms (e.g., wheelchair, bedside commode, etc,.)?: None Help needed moving to and from a bed to chair (including a wheelchair)?: A Little Help needed walking in hospital room?: A Little Help needed climbing 3-5 steps with a railing? : A Little 6 Click Score: 21    End of Session Equipment Utilized During Treatment: Gait belt Activity Tolerance: Patient tolerated treatment well Patient left: in chair;with call bell/phone within reach;with family/visitor present   PT Visit Diagnosis: Other abnormalities of gait and mobility (R26.89)    Time: 0076-2263 PT Time Calculation (min) (ACUTE ONLY): 15 min   Charges:   PT Evaluation $PT Eval Low Complexity: 1 Low     PT G Codes:   PT G-Codes **NOT FOR INPATIENT CLASS** Functional Assessment Tool Used: AM-PAC 6 Clicks Basic Mobility;Clinical judgement Functional Limitation: Mobility: Walking and moving around Mobility: Walking and Moving Around Current Status ([id number]): At least 20 percent but less than 40 percent impaired, limited or restricted Mobility: Walking and Moving Around Goal Status [phone]): At least 1 percent but less than 20 percent impaired, limited or restricted   Carmelia Bake, PT, DPT 10/26/2017 Pager: [phone]  York Ram E 10/26/2017, 12:50 PM

## 2017-10-27 DIAGNOSIS — E878 Other disorders of electrolyte and fluid balance, not elsewhere classified: Secondary | ICD-10-CM | POA: Diagnosis not present

## 2017-10-27 DIAGNOSIS — I951 Orthostatic hypotension: Secondary | ICD-10-CM | POA: Diagnosis not present

## 2017-10-27 DIAGNOSIS — E876 Hypokalemia: Secondary | ICD-10-CM | POA: Diagnosis not present

## 2017-10-27 LAB — BASIC METABOLIC PANEL
ANION GAP: 5 (ref 5–15)
BUN: 13 mg/dL (ref 6–20)
CO2: 23 mmol/L (ref 22–32)
Calcium: 8.2 mg/dL — ABNORMAL LOW (ref 8.9–10.3)
Chloride: 110 mmol/L (ref 101–111)
Creatinine, Ser: 0.82 mg/dL (ref 0.44–1.00)
GFR calc Af Amer: >60 mL/min (ref >60)
GLUCOSE: 155 mg/dL — AB (ref 65–99)
POTASSIUM: 4.2 mmol/L (ref 3.5–5.1)
Sodium: 138 mmol/L (ref 135–145)

## 2017-10-27 LAB — GLUCOSE, CAPILLARY: GLUCOSE-CAPILLARY: 152 mg/dL — AB (ref 65–99)

## 2017-10-27 LAB — MAGNESIUM: Magnesium: 1.5 mg/dL — ABNORMAL LOW (ref 1.7–2.4)

## 2017-10-27 MED ORDER — FLUDROCORTISONE ACETATE 0.1 MG PO TABS: 0.0500 mg | ORAL_TABLET | Freq: Every day | ORAL | 0 refills | Status: DC

## 2017-10-27 MED ORDER — MAGNESIUM SULFATE 2 GM/50ML IV SOLN
2.0000 g | Freq: Once | INTRAVENOUS | Status: AC
  Administered 2017-10-27: 2 g via INTRAVENOUS
  Filled 2017-10-27: qty 50

## 2017-10-27 MED ORDER — AMLODIPINE BESYLATE 5 MG PO TABS: 2.5000 mg | ORAL_TABLET | Freq: Every day | ORAL | Status: AC

## 2017-10-27 MED ORDER — POTASSIUM CHLORIDE ER 10 MEQ PO TBCR: 20.0000 meq | EXTENDED_RELEASE_TABLET | Freq: Every day | ORAL | 0 refills | Status: DC

## 2017-10-27 MED ORDER — MAGNESIUM OXIDE 400 (241.3 MG) MG PO TABS: 400.0000 mg | ORAL_TABLET | Freq: Three times a day (TID) | ORAL | 0 refills | Status: DC

## 2017-10-27 NOTE — Discharge Summary (Signed)
Physician Discharge Summary  [name] PPI:[id number] DOB: [birth year] DOA: 10/25/2017  PCP: Marletta Lor, MD  Admit date: 10/25/2017 Discharge date: 10/27/2017  Admitted From: Home Disposition: Home   Recommendations for Outpatient Follow-up:  1. Follow up with PCP in 1 week with repeat BMP and magnesium   Home Health: Yes Equipment/Devices: None  Discharge Condition: Stable CODE STATUS: Full Diet recommendation: Heart Healthy / Carb Modified   Brief/Interim Summary: 83 year old female with history of hypertension, diabetes, recent pulmonary embolism on anticoagulation was admitted with complains of fluctuating blood pressure and was found to have orthostatic hypotension with hypokalemia and hypomagnesemia.  Patient had recently stopped taking Florinef and antihypertensives as per primary care provider's recommendations.  Patient was restarted on Florinef hospitalization but at a lower dose.  Her condition improved, orthostatic hypotension resolved.  Patient feels better and wants to go home.  Discharge Diagnoses:  Principal Problem:   Electrolyte abnormality Active Problems:   Essential hypertension   Diabetes mellitus with neuropathy (HCC)   Pulmonary embolism (HCC)   Electrolyte imbalance   Blood pressure alteration   1. Electrolyte abnormalities with severe hypokalemia and hypomagnesemia -status post replacement; improving.  Discharge patient on oral potassium and magnesium supplement and follow-up of BMP and magnesium the primary care provider  2. Orthostatic hypotension probably causing near syncopal episodes -improved.  Patient has been started on Florinef at half the home dose will be continued on discharge.  Amlodipine dose will be decreased to 2.5 mg daily. outpatient follow-up with primary care provider.  Encourage patient to check blood pressure and record daily prior to provider follow-up 3.  diabetes mellitus type 2 -continue Glucotrol.  Hold metformin  until evaluation by primary care provider  4. recent PE: continue apixaban. 5. Mildly elevated renal function probably acute renal failure -improved with hydration.  BMP as an outpatient follow-up with primary care provider 6. Hyperlipidemia on statins.    Discharge Instructions  Discharge Instructions    Call MD for:  difficulty breathing, headache or visual disturbances   Complete by:  As directed    Call MD for:  extreme fatigue   Complete by:  As directed    Call MD for:  hives   Complete by:  As directed    Call MD for:  persistant dizziness or light-headedness   Complete by:  As directed    Call MD for:  persistant nausea and vomiting   Complete by:  As directed    Call MD for:  severe uncontrolled pain   Complete by:  As directed    Call MD for:  temperature >100.4   Complete by:  As directed    Diet - low sodium heart healthy   Complete by:  As directed    Increase activity slowly   Complete by:  As directed      Allergies as of 10/27/2017      Reactions   Cymbalta [duloxetine Hcl] Anaphylaxis   Oxycodone Other (See Comments)   Nightmares   Ramipril Other (See Comments)   REACTION: difficulty breathing   Hydrochlorothiazide Rash   Telmisartan-hctz Rash      Medication List    TAKE these medications   amLODipine 5 MG tablet Commonly known as:  NORVASC Take 0.5 tablets (2.5 mg total) daily by mouth. What changed:  how much to take   apixaban 5 MG Tabs tablet Commonly known as:  ELIQUIS Take 1 tablet (5 mg total) 2 (two) times daily by mouth.   atorvastatin 20  MG tablet Commonly known as:  LIPITOR TAKE 1 TABLET (20 MG TOTAL) BY MOUTH DAILY.   esomeprazole 40 MG capsule Commonly known as:  NEXIUM TAKE ONE CAPSULE BY MOUTH EVERY DAY   fludrocortisone 0.1 MG tablet Commonly known as:  FLORINEF Take 0.5 tablets (0.05 mg total) daily by mouth. What changed:  how much to take   glipiZIDE 2.5 MG 24 hr tablet Commonly known as:  GLUCOTROL XL Take 1  tablet (2.5 mg total) by mouth daily with breakfast.   glucose blood test strip Use to check CBG TID. Dx: E11.9   imipramine 25 MG tablet Commonly known as:  TOFRANIL TAKE 1 TABLET BY MOUTH EVERY DAY   magnesium oxide 400 (241.3 Mg) MG tablet Commonly known as:  MAG-OX Take 1 tablet (400 mg total) 3 (three) times daily with meals by mouth.   metFORMIN 500 MG tablet Commonly known as:  GLUCOPHAGE Take 1 tablet (500 mg total) daily with breakfast by mouth.   ONE TOUCH ULTRA 2 w/Device Kit 1 Device 3 (three) times daily before meals by Does not apply route. Dx :E11.9   onetouch ultrasoft lancets Use to check CBG TID. Dx : E11.9   potassium chloride 10 MEQ tablet Commonly known as:  K-DUR Take 2 tablets (20 mEq total) daily by mouth.   VITAMIN B 12 PO Take 1,000 mcg by mouth every morning.      Follow-up Information    [facility] Follow up.   Specialty:  [place] Why:  [facility] physical therapy Contact information: [place] [address] [phone]        Marletta Lor, MD. Schedule an appointment as soon as possible for a visit in 1 week(s).   Specialty:  Internal Medicine Why:  with repeat BMP/magnesium Contact information: [address] [place] [address] [phone]          Allergies  Allergen Reactions  . Cymbalta [Duloxetine Hcl] Anaphylaxis  . Oxycodone Other (See Comments)    Nightmares  . Ramipril Other (See Comments)    REACTION: difficulty breathing  . Hydrochlorothiazide Rash  . Telmisartan-Hctz Rash    Consultations:  None   Procedures/Studies: Ct Head Wo Contrast  Result Date: 10/26/2017 CLINICAL DATA:  Acute onset of headache, blurred vision and dizziness. High blood pressure. EXAM: CT HEAD WITHOUT CONTRAST TECHNIQUE: Contiguous axial images were obtained from the base of the skull through the vertex without intravenous contrast. COMPARISON:  CT of the head performed  03/22/2015 FINDINGS: Brain: No evidence of acute infarction, hemorrhage, hydrocephalus, extra-axial collection or mass lesion/mass effect. Prominence of the ventricles and sulci reflects mild cortical volume loss. Diffuse periventricular and subcortical white matter change likely reflects small vessel ischemic microangiopathy. Mild cerebellar atrophy is noted. The brainstem and fourth ventricle are within normal limits. The basal ganglia are unremarkable in appearance. The cerebral hemispheres demonstrate grossly normal gray-white differentiation. No mass effect or midline shift is seen. Vascular: No hyperdense vessel or unexpected calcification. Skull: There is no evidence of fracture; visualized osseous structures are unremarkable in appearance. Sinuses/Orbits: The orbits are within normal limits. The paranasal sinuses and mastoid air cells are well-aerated. Other: No significant soft tissue abnormalities are seen. IMPRESSION: 1. No acute intracranial pathology seen on CT. 2. Mild cortical volume loss and diffuse small vessel ischemic microangiopathy. Electronically Signed   By: Garald Balding M.D.   On: 10/26/2017 01:58   Ct Angio Chest W/cm &/or Wo Cm  Result Date: 10/18/2017  CLINICAL DATA:  Increasing shortness of breath over several months EXAM: CT ANGIOGRAPHY CHEST WITH CONTRAST TECHNIQUE: Multidetector CT imaging of the chest was performed using the standard protocol during bolus administration of intravenous contrast. Multiplanar CT image reconstructions and MIPs were obtained to evaluate the vascular anatomy. CONTRAST:  80 cc Isovue 370 COMPARISON:  Chest x-ray of 09/16/2012 FINDINGS: Cardiovascular: Moderate thoracic aortic atherosclerosis is present. There are pulmonary emboli within branches of the right pulmonary artery to the right lower lobe. No additional pulmonary emboli are seen. There is moderate cardiomegaly present. No pericardial effusion is seen. Diffuse coronary artery calcifications are  noted. The mid ascending thoracic aorta measures 36 mm in diameter pre Mediastinum/Nodes: There are small mediastinal lymph nodes, 1 of the larger being paratracheal just above the carina measuring 9 mm in short axis diameter. No definite mediastinal or hilar adenopathy is seen. Lungs/Pleura: On lung window images, only a single 3 mm nodule is noted in the left lower lobe of doubtful clinical significance in this age patient. No pneumonia is seen. No pleural effusion is noted. The central airway is patent Upper Abdomen: The portion of the upper abdomen that is visualized is unremarkable. No calcified gallstones are seen. There may be a small hiatal hernia present. Musculoskeletal: The thoracic vertebrae are in normal alignment other than mild kyphosis. There are degenerative changes in the mid to lower thoracic spine. Review of the MIP images confirms the above findings. IMPRESSION: 1. Small pulmonary emboli are present within branches of the right pulmonary artery to the right lower lobe. These emboli do not appear to be occlusive. No central emboli are seen. 2. Moderate thoracic aortic atherosclerosis. 3. Diffuse coronary artery calcifications. 4. This report will we called to the referring physician by the radiology assistant. Electronically Signed   By: Ivar Drape M.D.   On: 10/18/2017 11:38       Subjective: Patient seen and examined at bedside.  She denies any overnight fever, nausea or vomiting.  No dizziness on walking to the bathroom.  Discharge Exam: Vitals:   10/26/17 2133 10/27/17 0623  BP: (!) 161/90 136/90  Pulse: 95 89  Resp: 18 18  Temp: 98.4 F (36.9 C) 98.2 F (36.8 C)  SpO2: 99% 98%   Vitals:   10/26/17 0410 10/26/17 1257 10/26/17 2133 10/27/17 0623  BP: (!) 173/93 (!) 153/102 (!) 161/90 136/90  Pulse: 91 97 95 89  Resp:  19 18 18   Temp:  97.7 F (36.5 C) 98.4 F (36.9 C) 98.2 F (36.8 C)  TempSrc:  Oral Oral Oral  SpO2:  96% 99% 98%  Weight: 77.7 kg (171 lb 4.8 oz)      Height: 5' 3"  (1.6 m)       General: Pt is alert, awake, not in acute distress Cardiovascular: Rate controlled, S1/S2 + Respiratory: Bilateral decreased breath sounds at bases Abdominal: Soft, NT, ND, bowel sounds + Extremities: no edema, no cyanosis    The results of significant diagnostics from this hospitalization (including imaging, microbiology, ancillary and laboratory) are listed below for reference.     Microbiology: No results found for this or any previous visit (from the past 240 hour(s)).   Labs: BNP (last 3 results) No results for input(s): BNP in the last 8760 hours. Basic Metabolic Panel: Recent Labs  Lab 10/26/17 0021 10/26/17 0028 10/26/17 0741 10/27/17 0544  NA  --  140 139 138  K 2.6* 2.7* 3.1* 4.2  CL  --  100* 105 110  CO2  --   --  27 23  GLUCOSE  --  174* 145* 155*  BUN  --  17 17 13   CREATININE  --  1.20* 1.03* 0.82  CALCIUM  --   --  8.0* 8.2*  MG 1.2*  --  1.8 1.5*   Liver Function Tests: No results for input(s): AST, ALT, ALKPHOS, BILITOT, PROT, ALBUMIN in the last 168 hours. No results for input(s): LIPASE, AMYLASE in the last 168 hours. No results for input(s): AMMONIA in the last 168 hours. CBC: Recent Labs  Lab 10/26/17 0000 10/26/17 0028 10/26/17 0741  WBC 6.8  --  7.3  NEUTROABS 4.2  --   --   HGB 11.9* 12.6 11.4*  HCT 35.8* 37.0 34.6*  MCV 89.3  --  89.6  PLT 172  --  173   Cardiac Enzymes: No results for input(s): CKTOTAL, CKMB, CKMBINDEX, TROPONINI in the last 168 hours. BNP: Invalid input(s): POCBNP CBG: Recent Labs  Lab 10/26/17 0819 10/26/17 1127 10/26/17 1646 10/26/17 2138 10/27/17 0724  GLUCAP 138* 282* 200* 158* 152*   D-Dimer No results for input(s): DDIMER in the last 72 hours. Hgb A1c No results for input(s): HGBA1C in the last 72 hours. Lipid Profile No results for input(s): CHOL, HDL, LDLCALC, TRIG, CHOLHDL, LDLDIRECT in the last 72 hours. Thyroid function studies No results for input(s):  TSH, T4TOTAL, T3FREE, THYROIDAB in the last 72 hours.  Invalid input(s): FREET3 Anemia work up No results for input(s): VITAMINB12, FOLATE, FERRITIN, TIBC, IRON, RETICCTPCT in the last 72 hours. Urinalysis    Component Value Date/Time   COLORURINE YELLOW 10/26/2017 0800   APPEARANCEUR CLEAR 10/26/2017 0800   LABSPEC 1.013 10/26/2017 0800   PHURINE 5.0 10/26/2017 0800   GLUCOSEU >=500 (A) 10/26/2017 0800   HGBUR SMALL (A) 10/26/2017 0800   HGBUR negative 08/21/2007 0839   BILIRUBINUR NEGATIVE 10/26/2017 0800   KETONESUR 5 (A) 10/26/2017 0800   PROTEINUR NEGATIVE 10/26/2017 0800   UROBILINOGEN 0.2 03/22/2015 2212   NITRITE NEGATIVE 10/26/2017 0800   LEUKOCYTESUR NEGATIVE 10/26/2017 0800   Sepsis Labs Invalid input(s): PROCALCITONIN,  WBC,  LACTICIDVEN Microbiology No results found for this or any previous visit (from the past 240 hour(s)).   Time coordinating discharge: 35 minutes  SIGNED:   Aline August, MD  [facility] 10/27/2017, 10:02 AM Pager: [phone]  If 7PM-7AM, please contact night-coverage [email or web address] Password [email or web address]

## 2017-10-27 NOTE — Progress Notes (Signed)
Reviewed discharge information with patient and caregiver. Answered all questions. Patient/caregiver able to teach back medications and reasons to contact MD/911. Patient verbalizes importance of PCP follow up appointment. [facility] set up.  Barbee [place]. Brigitte Pulse, RN

## 2017-10-27 NOTE — Care Management Note (Signed)
Case Management Note  Patient Details  Name: [name] MRN: [id number] Date of Birth: 1934-09-23  Subjective/Objective:   Please see previous NCM notes.                  Action/Plan: Discharge Planning:  Contacted [facility] to make aware of dc home today with [facility]. Orders are in Epic.    Expected Discharge Date:  10/27/17               Expected Discharge Plan:  [place]  In-House Referral:  NA  Discharge planning Services  CM Consult  Post Acute Care Choice:  Home Health([facility]-they received referral from pcp office PTA) Choice offered to:  Adult Children, Patient  DME Arranged:  N/A DME Agency:  NA  HH Arranged:  PT HH Agency:  [facility]  Status of Service:  Completed, signed off  If discussed at [place] of Stay Meetings, dates discussed:    Additional Comments:  Erenest Rasher, RN 10/27/2017, 10:38 AM

## 2017-10-28 DIAGNOSIS — I10 Essential (primary) hypertension: Secondary | ICD-10-CM | POA: Diagnosis not present

## 2017-10-28 DIAGNOSIS — Z86711 Personal history of pulmonary embolism: Secondary | ICD-10-CM | POA: Diagnosis not present

## 2017-10-28 DIAGNOSIS — I951 Orthostatic hypotension: Secondary | ICD-10-CM | POA: Diagnosis not present

## 2017-10-28 DIAGNOSIS — E119 Type 2 diabetes mellitus without complications: Secondary | ICD-10-CM | POA: Diagnosis not present

## 2017-10-28 DIAGNOSIS — M6281 Muscle weakness (generalized): Secondary | ICD-10-CM | POA: Diagnosis not present

## 2017-10-28 DIAGNOSIS — E785 Hyperlipidemia, unspecified: Secondary | ICD-10-CM | POA: Diagnosis not present

## 2017-10-29 ENCOUNTER — Telehealth: Payer: Self-pay | Admitting: Family Medicine

## 2017-10-29 NOTE — Telephone Encounter (Signed)
Transition Care Management Follow-up Telephone Call  Per Discharge Summary: Admit date: 10/25/2017 Discharge date: 10/27/2017  Admitted From: Home Disposition: Home   Recommendations for Outpatient Follow-up:  1. Follow up with PCP in 1 week with repeat BMP and magnesium   Home Health: Yes Equipment/Devices: None  Discharge Condition: Stable CODE STATUS: Full Diet recommendation: Heart Healthy / Carb Modified   --   How have you been since you were released from the hospital? "She's been okay. She's been having some headaches. Her blood pressure has been kind of going up and down." She reports it is better than it was, but still fluctuating. Her HR is still running in the 90s.   Do you understand why you were in the hospital? yes   Do you understand the discharge instructions? yes   Where were you discharged to? Home   Items Reviewed:  Medications reviewed: yes  Allergies reviewed: yes  Dietary changes reviewed: yes  Referrals reviewed: yes, home health   Functional Questionnaire:   Activities of Daily Living (ADLs):   She states they are independent in the following: bathing and hygiene, feeding, continence, grooming, toileting and dressing States they require assistance with the following: ambulation, standby assist for balance. Using walker at home.   Any transportation issues/concerns?: no   Any patient concerns? yes, patient is still having diarrhea. She's had this for a while, but it is more frequent now.   Confirmed importance and date/time of follow-up visits scheduled yes  Provider Appointment booked with Dr. Bluford Kaufmann 11/06/17 @ 2:45pm  Confirmed with patient if condition begins to worsen call PCP or go to the ER.  Patient was given the office number and encouraged to call back with question or concerns.  : yes

## 2017-10-29 NOTE — Telephone Encounter (Signed)
Copied from [place] [id number]. Topic: Appointment Scheduling - Scheduling Inquiry for Clinic >> Oct 29, 2017  1:17 PM Hewitt Shorts wrote: Reason for CRM:pt has requested a mychart hospital follow up for 10/29/17 or 10/30/17 with Dr. Bluford Kaufmann is booked based on what is requested and she needs to know of a work in is possible   Best number

## 2017-10-29 NOTE — Telephone Encounter (Signed)
Copied from [place] [id number]. Topic: Quick Communication - See Telephone Encounter >> Oct 29, 2017 11:22 AM Patrice Paradise wrote: Sandrea Hughs from [place] [phone] calling to inquiry about getting home health care set up for the patient. Katrina would like a call back to get this set up, please   CRM for notification. See Telephone encounter for: 10/29/17.

## 2017-10-30 DIAGNOSIS — I951 Orthostatic hypotension: Secondary | ICD-10-CM | POA: Diagnosis not present

## 2017-10-30 DIAGNOSIS — Z86711 Personal history of pulmonary embolism: Secondary | ICD-10-CM | POA: Diagnosis not present

## 2017-10-30 DIAGNOSIS — I10 Essential (primary) hypertension: Secondary | ICD-10-CM | POA: Diagnosis not present

## 2017-10-30 DIAGNOSIS — E119 Type 2 diabetes mellitus without complications: Secondary | ICD-10-CM | POA: Diagnosis not present

## 2017-10-30 DIAGNOSIS — M6281 Muscle weakness (generalized): Secondary | ICD-10-CM | POA: Diagnosis not present

## 2017-10-30 DIAGNOSIS — E785 Hyperlipidemia, unspecified: Secondary | ICD-10-CM | POA: Diagnosis not present

## 2017-10-31 ENCOUNTER — Other Ambulatory Visit: Payer: Self-pay | Admitting: Internal Medicine

## 2017-11-01 DIAGNOSIS — E119 Type 2 diabetes mellitus without complications: Secondary | ICD-10-CM | POA: Diagnosis not present

## 2017-11-01 DIAGNOSIS — M6281 Muscle weakness (generalized): Secondary | ICD-10-CM | POA: Diagnosis not present

## 2017-11-01 DIAGNOSIS — I10 Essential (primary) hypertension: Secondary | ICD-10-CM | POA: Diagnosis not present

## 2017-11-01 DIAGNOSIS — Z86711 Personal history of pulmonary embolism: Secondary | ICD-10-CM | POA: Diagnosis not present

## 2017-11-01 DIAGNOSIS — I951 Orthostatic hypotension: Secondary | ICD-10-CM | POA: Diagnosis not present

## 2017-11-01 DIAGNOSIS — E876 Hypokalemia: Secondary | ICD-10-CM | POA: Diagnosis not present

## 2017-11-01 DIAGNOSIS — E785 Hyperlipidemia, unspecified: Secondary | ICD-10-CM | POA: Diagnosis not present

## 2017-11-03 ENCOUNTER — Other Ambulatory Visit: Payer: Self-pay | Admitting: Internal Medicine

## 2017-11-05 ENCOUNTER — Other Ambulatory Visit: Payer: Self-pay | Admitting: Internal Medicine

## 2017-11-05 ENCOUNTER — Telehealth: Payer: Self-pay | Admitting: Internal Medicine

## 2017-11-05 DIAGNOSIS — I951 Orthostatic hypotension: Secondary | ICD-10-CM | POA: Diagnosis not present

## 2017-11-05 DIAGNOSIS — M6281 Muscle weakness (generalized): Secondary | ICD-10-CM | POA: Diagnosis not present

## 2017-11-05 DIAGNOSIS — Z86711 Personal history of pulmonary embolism: Secondary | ICD-10-CM | POA: Diagnosis not present

## 2017-11-05 DIAGNOSIS — I10 Essential (primary) hypertension: Secondary | ICD-10-CM | POA: Diagnosis not present

## 2017-11-05 DIAGNOSIS — E119 Type 2 diabetes mellitus without complications: Secondary | ICD-10-CM | POA: Diagnosis not present

## 2017-11-05 DIAGNOSIS — E785 Hyperlipidemia, unspecified: Secondary | ICD-10-CM | POA: Diagnosis not present

## 2017-11-05 MED ORDER — MAGNESIUM OXIDE 400 (241.3 MG) MG PO TABS
400.0000 mg | ORAL_TABLET | Freq: Three times a day (TID) | ORAL | 0 refills | Status: DC
Start: 2017-11-05 — End: 2017-11-14

## 2017-11-05 MED ORDER — LANCET DEVICE MISC: 11 refills | Status: AC

## 2017-11-05 MED ORDER — FLUDROCORTISONE ACETATE 0.1 MG PO TABS
0.0500 mg | ORAL_TABLET | Freq: Every day | ORAL | 0 refills | Status: DC
Start: 2017-11-05 — End: 2017-11-14

## 2017-11-05 NOTE — Telephone Encounter (Signed)
Yes okay to refill: Eliquis 5 mg 1 tablet twice daily   refill x3

## 2017-11-05 NOTE — Telephone Encounter (Signed)
Spoke with pts daughter and refilled requested medications

## 2017-11-05 NOTE — Telephone Encounter (Signed)
Signature reads : "TAKE 2 TABLETS BY MOUTH 2 TIMES DAILY FOR 5 DAYS"  For Eliquis 5 mg Ok to refill? Please advise.

## 2017-11-05 NOTE — Telephone Encounter (Signed)
Patient's daughter is calling to let the provider know that her mother has a follow up appointment tomorrow. She was re hospitalized and that is why she missed the first follow up. They need to get the Florinef and K-dur refilled today if possible. She is also out of the magnesium as well.  Daughter is also reporting that she received the wrong lancets to go with the meter. One Touch Ultra Two is what her mother is using.

## 2017-11-06 ENCOUNTER — Encounter: Payer: Self-pay | Admitting: Internal Medicine

## 2017-11-06 ENCOUNTER — Other Ambulatory Visit: Payer: Self-pay | Admitting: Internal Medicine

## 2017-11-06 ENCOUNTER — Ambulatory Visit (INDEPENDENT_AMBULATORY_CARE_PROVIDER_SITE_OTHER): Payer: Medicare Other | Admitting: Internal Medicine

## 2017-11-06 ENCOUNTER — Ambulatory Visit: Payer: Medicare Other | Admitting: Internal Medicine

## 2017-11-06 VITALS — BP 122/80 | HR 100 | Ht 63.0 in | Wt 165.2 lb

## 2017-11-06 DIAGNOSIS — E785 Hyperlipidemia, unspecified: Secondary | ICD-10-CM

## 2017-11-06 DIAGNOSIS — I10 Essential (primary) hypertension: Secondary | ICD-10-CM | POA: Diagnosis not present

## 2017-11-06 DIAGNOSIS — E0840 Diabetes mellitus due to underlying condition with diabetic neuropathy, unspecified: Secondary | ICD-10-CM

## 2017-11-06 DIAGNOSIS — E876 Hypokalemia: Secondary | ICD-10-CM | POA: Diagnosis not present

## 2017-11-06 DIAGNOSIS — E119 Type 2 diabetes mellitus without complications: Secondary | ICD-10-CM | POA: Diagnosis not present

## 2017-11-06 DIAGNOSIS — I951 Orthostatic hypotension: Secondary | ICD-10-CM

## 2017-11-06 DIAGNOSIS — M6281 Muscle weakness (generalized): Secondary | ICD-10-CM | POA: Diagnosis not present

## 2017-11-06 DIAGNOSIS — Z86711 Personal history of pulmonary embolism: Secondary | ICD-10-CM | POA: Diagnosis not present

## 2017-11-06 MED ORDER — APIXABAN 5 MG PO TABS: 5.0000 mg | ORAL_TABLET | Freq: Two times a day (BID) | ORAL | 3 refills | Status: AC

## 2017-11-06 NOTE — Patient Instructions (Addendum)
Orthostatic Hypotension Orthostatic hypotension is a sudden drop in blood pressure that happens when you quickly change positions, such as when you get up from a seated or lying position. Blood pressure is a measurement of how strongly, or weakly, your blood is pressing against the walls of your arteries. Arteries are blood vessels that carry blood from your heart throughout your body. When blood pressure is too low, you may not get enough blood to your brain or to the rest of your organs. This can cause weakness, light-headedness, rapid heartbeat, and fainting. This can last for just a few seconds or for up to a few minutes. Orthostatic hypotension is usually not a serious problem. However, if it happens frequently or gets worse, it may be a sign of something more serious. What are the causes? This condition may be caused by:  Sudden changes in posture, such as standing up quickly after you have been sitting or lying down.  Blood loss.  Loss of body fluids (dehydration).  Heart problems.  Hormone (endocrine) problems.  Pregnancy.  Severe infection.  Lack of certain nutrients.  Severe allergic reactions (anaphylaxis).  Certain medicines, such as blood pressure medicine or medicines that make the body lose excess fluids (diuretics). Sometimes, this condition can be caused by not taking medicine as directed, such as taking too much of a certain medicine.  What increases the risk? Certain factors can make you more likely to develop orthostatic hypotension, including:  Age. Risk increases as you get older.  Conditions that affect the heart or the central nervous system.  Taking certain medicines, such as blood pressure medicine or diuretics.  Being pregnant.  What are the signs or symptoms? Symptoms of this condition may include:  Weakness.  Light-headedness.  Dizziness.  Blurred vision.  Fatigue.  Rapid heartbeat.  Fainting, in severe cases.  How is this  diagnosed? This condition is diagnosed based on:  Your medical history.  Your symptoms.  Your blood pressure measurement. Your health care provider will check your blood pressure when you are: ? Lying down. ? Sitting. ? Standing.  A blood pressure reading is recorded as two numbers, such as "120 over 80" (or 120/80). The first ("top") number is called the systolic pressure. It is a measure of the pressure in your arteries as your heart beats. The second ("bottom") number is called the diastolic pressure. It is a measure of the pressure in your arteries when your heart relaxes between beats. Blood pressure is measured in a unit called mm Hg. Healthy blood pressure for adults is 120/80. If your blood pressure is below 90/60, you may be diagnosed with hypotension. Other information or tests that may be used to diagnose orthostatic hypotension include:  Your other vital signs, such as your heart rate and temperature.  Blood tests.  Tilt table test. For this test, you will be safely secured to a table that moves you from a lying position to an upright position. Your heart rhythm and blood pressure will be monitored during the test.  How is this treated? Treatment for this condition may include:  Changing your diet. This may involve eating more salt (sodium) or drinking more water.  Taking medicines to raise your blood pressure.  Changing the dosage of certain medicines you are taking that might be lowering your blood pressure.  Wearing compression stockings. These stockings help to prevent blood clots and reduce swelling in your legs.  In some cases, you may need to go to the hospital for:    Fluid replacement. This means you will receive fluids through an IV tube.  Blood replacement. This means you will receive donated blood through an IV tube (transfusion).  Treating an infection or heart problems, if this applies.  Monitoring. You may need to be monitored while medicines that you  are taking wear off.  Follow these instructions at home: Eating and drinking   Drink enough fluid to keep your urine clear or pale yellow.  Eat a healthy diet and follow instructions from your health care provider about eating or drinking restrictions. A healthy diet includes: ? Fresh fruits and vegetables. ? Whole grains. ? Lean meats. ? Low-fat dairy products.  Eat extra salt only as directed. Do not add extra salt to your diet unless your health care provider told you to do that.  Eat frequent, small meals.  Avoid standing up suddenly after eating. Medicines  Take over-the-counter and prescription medicines only as told by your health care provider. ? Follow instructions from your health care provider about changing the dosage of your current medicines, if this applies. ? Do not stop or adjust any of your medicines on your own. General instructions  Wear compression stockings as told by your health care provider.  Get up slowly from lying down or sitting positions. This gives your blood pressure a chance to adjust.  Avoid hot showers and excessive heat as directed by your health care provider.  Return to your normal activities as told by your health care provider. Ask your health care provider what activities are safe for you.  Do not use any products that contain nicotine or tobacco, such as cigarettes and e-cigarettes. If you need help quitting, ask your health care provider.  Keep all follow-up visits as told by your health care provider. This is important. Contact a health care provider if:  You vomit.  You have diarrhea.  You have a fever for more than 2-3 days.  You feel more thirsty than usual.  You feel weak and tired. Get help right away if:  You have chest pain.  You have a fast or irregular heartbeat.  You develop numbness in any part of your body.  You cannot move your arms or your legs.  You have trouble speaking.  You become sweaty or feel  lightheaded.  You faint.  You feel short of breath.  You have trouble staying awake.  You feel confused. This information is not intended to replace advice given to you by your health care provider. Make sure you discuss any questions you have with your health care provider. Document Released: 11/17/2002 Document Revised: 08/15/2016 Document Reviewed: 05/19/2016 Elsevier Interactive Patient Education  2018 [employer]  

## 2017-11-06 NOTE — Progress Notes (Signed)
Subjective:    Patient ID: [name], female    DOB: 1934-03-11, 83 y.o.   MRN: [id number]  HPI  Admit date: 10/25/2017 Discharge date: 10/27/2017   Recommendations for Outpatient Follow-up:  1. Follow up with PCP in 1 week with repeat BMP and magnesium   Brief/Interim Summary: 83 year old female with history of hypertension, diabetes, recent pulmonary embolism on anticoagulation was admitted with complains of fluctuating blood pressure and was found to have orthostatic hypotension with hypokalemia and hypomagnesemia.  Patient had recently stopped taking Florinef and antihypertensives as per primary care provider's recommendations.  Patient was restarted on Florinef hospitalization but at a lower dose.  Her condition improved, orthostatic hypotension resolved.  Patient feels better and wants to go home.  Discharge Diagnoses:  Principal Problem:   Electrolyte abnormality Active Problems:   Essential hypertension   Diabetes mellitus with neuropathy (HCC)   Pulmonary embolism (HCC)   Electrolyte imbalance   Blood pressure alteration  83 year old patient who is seen today following a recent hospital discharge and for transitional care management.  She has a history of diabetes and more recently has had significant labile blood pressure readings with orthostatic hypotension.  She has been diagnosed and treated recently for bilateral pulmonary emboli but orthostatic hypotension is thought likely related to diabetic autonomic dysfunction. She continues to have labile blood pressure readings and at times significant orthostatic symptoms.  There have been no falls She was discharged on amlodipine 2.5 mg as well as Florinef.  She continues to have some high blood pressure readings at home.  Blood pressure here today 100/70 sitting.  Blood pressure was not audible standing.  She became slightly lightheaded.  Past Medical History:  Diagnosis Date  . Aortic stenosis   . CVA  (cerebral infarction)   . Depression   . Diabetes mellitus   . DVT (deep venous thrombosis) ([place])   . GERD (gastroesophageal reflux disease)   . Hyperlipidemia   . Hypertension   . Osteoarthritis   . Pulmonary embolism [facility])      Social History   Socioeconomic History  . Marital status: Widowed    Spouse name: Not on file  . Number of children: Not on file  . Years of education: Not on file  . Highest education level: Not on file  Social Needs  . Financial resource strain: Not on file  . Food insecurity - worry: Not on file  . Food insecurity - inability: Not on file  . Transportation needs - medical: Not on file  . Transportation needs - non-medical: Not on file  Occupational History  . Not on file  Tobacco Use  . Smoking status: Former Smoker    Last attempt to quit: 02/07/1981    Years since quitting: 36.7  . Smokeless tobacco: Never Used  Substance and Sexual Activity  . Alcohol use: No  . Drug use: No  . Sexual activity: Not on file  Other Topics Concern  . Not on file  Social History Narrative  . Not on file    Past Surgical History:  Procedure Laterality Date  . ABDOMINAL HYSTERECTOMY     fibroids, no cancer  . FRACTURE SURGERY     left leg  . JOINT REPLACEMENT     left and right knee  . ROTATOR CUFF REPAIR    . TEMPOROMANDIBULAR JOINT SURGERY      Family History  Problem Relation Age of Onset  . Dementia Mother   . Hypertension Mother   .  Heart attack Father   . Heart failure Sister     Allergies  Allergen Reactions  . Cymbalta [Duloxetine Hcl] Anaphylaxis  . Oxycodone Other (See Comments)    Nightmares  . Ramipril Other (See Comments)    REACTION: difficulty breathing  . Hydrochlorothiazide Rash  . Telmisartan-Hctz Rash    Current Outpatient Medications on File Prior to Visit  Medication Sig Dispense Refill  . amLODipine (NORVASC) 5 MG tablet Take 0.5 tablets (2.5 mg total) daily by mouth.    Marland Kitchen atorvastatin (LIPITOR) 20 MG tablet  TAKE 1 TABLET (20 MG TOTAL) BY MOUTH DAILY. 90 tablet 1  . Blood Glucose Monitoring Suppl (ONE TOUCH ULTRA 2) w/Device KIT 1 Device 3 (three) times daily before meals by Does not apply route. Dx :E11.9 1 each 0  . Cyanocobalamin (VITAMIN B 12 PO) Take 1,000 mcg by mouth every morning.     Marland Kitchen esomeprazole (NEXIUM) 40 MG capsule TAKE ONE CAPSULE BY MOUTH EVERY DAY 30 capsule 0  . fludrocortisone (FLORINEF) 0.1 MG tablet Take 0.5 tablets (0.05 mg total) by mouth daily. 30 tablet 0  . glipiZIDE (GLUCOTROL XL) 2.5 MG 24 hr tablet Take 1 tablet (2.5 mg total) by mouth daily with breakfast. 60 tablet 0  . glucose blood test strip Use to check CBG TID. Dx: E11.9 100 each 12  . imipramine (TOFRANIL) 25 MG tablet TAKE 1 TABLET BY MOUTH EVERY DAY 90 tablet 1  . Lancet Device MISC Use to check blood sugar three times a day before meals 200 each 11  . Lancets (ONETOUCH ULTRASOFT) lancets Use to check CBG TID. Dx : E11.9 100 each 12  . magnesium oxide (MAG-OX) 400 (241.3 Mg) MG tablet Take 1 tablet (400 mg total) by mouth 3 (three) times daily with meals. 21 tablet 0  . metFORMIN (GLUCOPHAGE) 500 MG tablet Take 1 tablet (500 mg total) daily with breakfast by mouth. 180 tablet 3   No current facility-administered medications on file prior to visit.     BP 122/80 (BP Location: Left Arm, Patient Position: Sitting, Cuff Size: Normal)   Pulse 100   Ht 5' 3"  (1.6 m)   Wt 165 lb 3.2 oz (74.9 kg)   SpO2 97%   BMI 29.26 kg/m     Review of Systems  Constitutional: Positive for activity change and appetite change.  HENT: Negative for congestion, dental problem, hearing loss, rhinorrhea, sinus pressure, sore throat and tinnitus.   Eyes: Negative for pain, discharge and visual disturbance.  Respiratory: Negative for cough and shortness of breath.   Cardiovascular: Negative for chest pain, palpitations and leg swelling.  Gastrointestinal: Negative for abdominal distention, abdominal pain, blood in stool,  constipation, diarrhea, nausea and vomiting.  Genitourinary: Negative for difficulty urinating, dysuria, flank pain, frequency, hematuria, pelvic pain, urgency, vaginal bleeding, vaginal discharge and vaginal pain.  Musculoskeletal: Negative for arthralgias, gait problem and joint swelling.  Skin: Negative for rash.  Neurological: Positive for dizziness, weakness and light-headedness. Negative for syncope, speech difficulty, numbness and headaches.  Hematological: Negative for adenopathy.  Psychiatric/Behavioral: Negative for agitation, behavioral problems and dysphoric mood. The patient is not nervous/anxious.        Objective:   Physical Exam  Constitutional: She is oriented to person, place, and time. She appears well-developed and well-nourished.  Blood pressure 100/70 sitting Inaudible standing with slight dizziness      HENT:  Head: Normocephalic.  Right Ear: External ear normal.  Left Ear: External ear normal.  Mouth/Throat: Oropharynx is  clear and moist.  Eyes: Conjunctivae and EOM are normal. Pupils are equal, round, and reactive to light.  Neck: Normal range of motion. Neck supple. No thyromegaly present.  Cardiovascular: Normal rate, regular rhythm, normal heart sounds and intact distal pulses.  Pulmonary/Chest: Effort normal and breath sounds normal.  Abdominal: Soft. Bowel sounds are normal. She exhibits no mass. There is no tenderness.  Musculoskeletal: Normal range of motion.  Lymphadenopathy:    She has no cervical adenopathy.  Neurological: She is alert and oriented to person, place, and time.  Skin: Skin is warm and dry. No rash noted.  Psychiatric: She has a normal mood and affect. Her behavior is normal.          Assessment & Plan:   Orthostatic hypotension.  Patient continues to have frequent high blood pressure readings as well as symptomatic hypotension.  We will continue to observe on present therapy at this time.  This is likely diabetic autonomic  dysfunction   History of recent pulmonary emboli continue anticoagulation History of electrolyte abnormalities.  Resolved Dyslipidemia continue atorvastatin  Follow-up 1 month Continue home blood pressure monitoring  Rida Loudin Pilar Plate

## 2017-11-07 DIAGNOSIS — E119 Type 2 diabetes mellitus without complications: Secondary | ICD-10-CM | POA: Diagnosis not present

## 2017-11-07 DIAGNOSIS — I951 Orthostatic hypotension: Secondary | ICD-10-CM | POA: Diagnosis not present

## 2017-11-07 DIAGNOSIS — Z86711 Personal history of pulmonary embolism: Secondary | ICD-10-CM | POA: Diagnosis not present

## 2017-11-07 DIAGNOSIS — E785 Hyperlipidemia, unspecified: Secondary | ICD-10-CM | POA: Diagnosis not present

## 2017-11-07 DIAGNOSIS — M6281 Muscle weakness (generalized): Secondary | ICD-10-CM | POA: Diagnosis not present

## 2017-11-07 DIAGNOSIS — I10 Essential (primary) hypertension: Secondary | ICD-10-CM | POA: Diagnosis not present

## 2017-11-08 DIAGNOSIS — I951 Orthostatic hypotension: Secondary | ICD-10-CM | POA: Diagnosis not present

## 2017-11-08 DIAGNOSIS — I10 Essential (primary) hypertension: Secondary | ICD-10-CM | POA: Diagnosis not present

## 2017-11-08 DIAGNOSIS — M6281 Muscle weakness (generalized): Secondary | ICD-10-CM | POA: Diagnosis not present

## 2017-11-08 DIAGNOSIS — E785 Hyperlipidemia, unspecified: Secondary | ICD-10-CM | POA: Diagnosis not present

## 2017-11-08 DIAGNOSIS — Z86711 Personal history of pulmonary embolism: Secondary | ICD-10-CM | POA: Diagnosis not present

## 2017-11-08 DIAGNOSIS — E119 Type 2 diabetes mellitus without complications: Secondary | ICD-10-CM | POA: Diagnosis not present

## 2017-11-11 ENCOUNTER — Other Ambulatory Visit: Payer: Self-pay | Admitting: Internal Medicine

## 2017-11-12 ENCOUNTER — Telehealth: Payer: Self-pay | Admitting: Internal Medicine

## 2017-11-12 DIAGNOSIS — Z86711 Personal history of pulmonary embolism: Secondary | ICD-10-CM | POA: Diagnosis not present

## 2017-11-12 DIAGNOSIS — E785 Hyperlipidemia, unspecified: Secondary | ICD-10-CM | POA: Diagnosis not present

## 2017-11-12 DIAGNOSIS — I951 Orthostatic hypotension: Secondary | ICD-10-CM | POA: Diagnosis not present

## 2017-11-12 DIAGNOSIS — I10 Essential (primary) hypertension: Secondary | ICD-10-CM | POA: Diagnosis not present

## 2017-11-12 DIAGNOSIS — E119 Type 2 diabetes mellitus without complications: Secondary | ICD-10-CM | POA: Diagnosis not present

## 2017-11-12 DIAGNOSIS — M6281 Muscle weakness (generalized): Secondary | ICD-10-CM | POA: Diagnosis not present

## 2017-11-12 NOTE — Telephone Encounter (Signed)
Betsy form [facility] stating that the pt has been experiencing nausea with getting up in the am and sometimes with standing throughout the day. Pt denies having nausea at this time. Pt has been seen previously for orthostatic hypotension and BP this morning was 118/82, with orthostatic vitals dropping to 80/61.O2 sats 96-97%. Pt has one month follow up appt scheduled for 12/31.

## 2017-11-12 NOTE — Telephone Encounter (Signed)
Ask  patient to increase Florinef to 1 full tablet daily

## 2017-11-12 NOTE — Telephone Encounter (Signed)
Sir please see below message, please advise.

## 2017-11-13 ENCOUNTER — Ambulatory Visit: Payer: Medicare Other | Admitting: Internal Medicine

## 2017-11-13 NOTE — Telephone Encounter (Signed)
Spoke with pt [name] and asked her to increase Florinef to 1 full tablet daily. Pt verbalized understanding.

## 2017-11-14 ENCOUNTER — Other Ambulatory Visit: Payer: Self-pay | Admitting: Internal Medicine

## 2017-11-14 DIAGNOSIS — E119 Type 2 diabetes mellitus without complications: Secondary | ICD-10-CM | POA: Diagnosis not present

## 2017-11-14 DIAGNOSIS — M6281 Muscle weakness (generalized): Secondary | ICD-10-CM | POA: Diagnosis not present

## 2017-11-14 DIAGNOSIS — I951 Orthostatic hypotension: Secondary | ICD-10-CM | POA: Diagnosis not present

## 2017-11-14 DIAGNOSIS — Z86711 Personal history of pulmonary embolism: Secondary | ICD-10-CM | POA: Diagnosis not present

## 2017-11-14 DIAGNOSIS — E785 Hyperlipidemia, unspecified: Secondary | ICD-10-CM | POA: Diagnosis not present

## 2017-11-14 DIAGNOSIS — I10 Essential (primary) hypertension: Secondary | ICD-10-CM | POA: Diagnosis not present

## 2017-11-15 MED ORDER — MAGNESIUM OXIDE 400 (241.3 MG) MG PO TABS
400.0000 mg | ORAL_TABLET | Freq: Three times a day (TID) | ORAL | 0 refills | Status: DC
Start: 2017-11-15 — End: 2017-11-28

## 2017-11-15 MED ORDER — FLUDROCORTISONE ACETATE 0.1 MG PO TABS: 0.0500 mg | ORAL_TABLET | Freq: Every day | ORAL | 0 refills | Status: AC

## 2017-11-15 MED ORDER — ESOMEPRAZOLE MAGNESIUM 40 MG PO CPDR: 40.0000 mg | DELAYED_RELEASE_CAPSULE | Freq: Every day | ORAL | 0 refills | Status: AC

## 2017-11-16 DIAGNOSIS — E119 Type 2 diabetes mellitus without complications: Secondary | ICD-10-CM | POA: Diagnosis not present

## 2017-11-16 DIAGNOSIS — I951 Orthostatic hypotension: Secondary | ICD-10-CM | POA: Diagnosis not present

## 2017-11-16 DIAGNOSIS — M6281 Muscle weakness (generalized): Secondary | ICD-10-CM | POA: Diagnosis not present

## 2017-11-16 DIAGNOSIS — E785 Hyperlipidemia, unspecified: Secondary | ICD-10-CM | POA: Diagnosis not present

## 2017-11-16 DIAGNOSIS — Z86711 Personal history of pulmonary embolism: Secondary | ICD-10-CM | POA: Diagnosis not present

## 2017-11-16 DIAGNOSIS — I10 Essential (primary) hypertension: Secondary | ICD-10-CM | POA: Diagnosis not present

## 2017-11-21 ENCOUNTER — Other Ambulatory Visit: Payer: Self-pay | Admitting: Internal Medicine

## 2017-11-21 DIAGNOSIS — E785 Hyperlipidemia, unspecified: Secondary | ICD-10-CM | POA: Diagnosis not present

## 2017-11-21 DIAGNOSIS — E119 Type 2 diabetes mellitus without complications: Secondary | ICD-10-CM | POA: Diagnosis not present

## 2017-11-21 DIAGNOSIS — M6281 Muscle weakness (generalized): Secondary | ICD-10-CM | POA: Diagnosis not present

## 2017-11-21 DIAGNOSIS — I951 Orthostatic hypotension: Secondary | ICD-10-CM | POA: Diagnosis not present

## 2017-11-21 DIAGNOSIS — I10 Essential (primary) hypertension: Secondary | ICD-10-CM | POA: Diagnosis not present

## 2017-11-21 DIAGNOSIS — Z86711 Personal history of pulmonary embolism: Secondary | ICD-10-CM | POA: Diagnosis not present

## 2017-11-27 DIAGNOSIS — I10 Essential (primary) hypertension: Secondary | ICD-10-CM | POA: Diagnosis not present

## 2017-11-27 DIAGNOSIS — E119 Type 2 diabetes mellitus without complications: Secondary | ICD-10-CM | POA: Diagnosis not present

## 2017-11-27 DIAGNOSIS — E785 Hyperlipidemia, unspecified: Secondary | ICD-10-CM | POA: Diagnosis not present

## 2017-11-27 DIAGNOSIS — M6281 Muscle weakness (generalized): Secondary | ICD-10-CM | POA: Diagnosis not present

## 2017-11-27 DIAGNOSIS — Z86711 Personal history of pulmonary embolism: Secondary | ICD-10-CM | POA: Diagnosis not present

## 2017-11-27 DIAGNOSIS — I951 Orthostatic hypotension: Secondary | ICD-10-CM | POA: Diagnosis not present

## 2017-11-28 ENCOUNTER — Telehealth: Payer: Self-pay | Admitting: Family Medicine

## 2017-11-28 ENCOUNTER — Other Ambulatory Visit: Payer: Self-pay | Admitting: Internal Medicine

## 2017-11-28 ENCOUNTER — Telehealth: Payer: Self-pay | Admitting: Internal Medicine

## 2017-11-28 DIAGNOSIS — M6281 Muscle weakness (generalized): Secondary | ICD-10-CM | POA: Diagnosis not present

## 2017-11-28 DIAGNOSIS — Z86711 Personal history of pulmonary embolism: Secondary | ICD-10-CM | POA: Diagnosis not present

## 2017-11-28 DIAGNOSIS — E119 Type 2 diabetes mellitus without complications: Secondary | ICD-10-CM | POA: Diagnosis not present

## 2017-11-28 DIAGNOSIS — E785 Hyperlipidemia, unspecified: Secondary | ICD-10-CM | POA: Diagnosis not present

## 2017-11-28 DIAGNOSIS — I951 Orthostatic hypotension: Secondary | ICD-10-CM | POA: Diagnosis not present

## 2017-11-28 DIAGNOSIS — I10 Essential (primary) hypertension: Secondary | ICD-10-CM | POA: Diagnosis not present

## 2017-11-28 NOTE — Telephone Encounter (Signed)
Spoke with [employer] @ [facility]. She states that family thought Dr Raliegh Ip was referring pt to either card or neuro for further evaluation of symptoms. The family would like to know which you would prefer her to see and if referral can be placed.   Dr. Raliegh Ip - Please advise. Thanks!

## 2017-11-28 NOTE — Telephone Encounter (Signed)
Please see below message, please advise.

## 2017-11-28 NOTE — Telephone Encounter (Signed)
Copied from [place] [id number]. Topic: General - Other >> Nov 27, 2017  3:17 PM Tonny Bollman wrote: Dominica Severin  PT assistant at [facility] [phone] called to inform Dr that  pt took a fall  earlier today around 11am  going from bathroom to bed, when she sat on bed she did not go back far enough and  slide down to floor.  Daughter was there when it happen     pt is not injured  ,not complaining of pain,  doesn't feel like she needs to go to ER   pt felt she blacked out a second or two

## 2017-11-28 NOTE — Telephone Encounter (Signed)
Copied from [place] [id number]. Topic: Quick Communication - See Telephone Encounter >> Nov 28, 2017  3:06 PM Synthia Innocent wrote: CRM for notification. See Telephone encounter for:  Patient fall yesterday, no injury, sore right hip, still expreinceing orthstatic bp, family is requesting specialist 11/28/17.

## 2017-11-29 DIAGNOSIS — I10 Essential (primary) hypertension: Secondary | ICD-10-CM | POA: Diagnosis not present

## 2017-11-29 DIAGNOSIS — Z86711 Personal history of pulmonary embolism: Secondary | ICD-10-CM | POA: Diagnosis not present

## 2017-11-29 DIAGNOSIS — E119 Type 2 diabetes mellitus without complications: Secondary | ICD-10-CM | POA: Diagnosis not present

## 2017-11-29 DIAGNOSIS — E785 Hyperlipidemia, unspecified: Secondary | ICD-10-CM | POA: Diagnosis not present

## 2017-11-29 DIAGNOSIS — M6281 Muscle weakness (generalized): Secondary | ICD-10-CM | POA: Diagnosis not present

## 2017-11-29 DIAGNOSIS — I951 Orthostatic hypotension: Secondary | ICD-10-CM | POA: Diagnosis not present

## 2017-12-01 NOTE — Telephone Encounter (Signed)
Neurolgy referral;  Orthostatic hypotension D/c amlodipine   Encourage smaller more frequent meals

## 2017-12-02 ENCOUNTER — Other Ambulatory Visit: Payer: Self-pay | Admitting: Internal Medicine

## 2017-12-05 ENCOUNTER — Encounter (HOSPITAL_COMMUNITY): Payer: Self-pay | Admitting: *Deleted

## 2017-12-05 ENCOUNTER — Other Ambulatory Visit: Payer: Self-pay

## 2017-12-05 ENCOUNTER — Inpatient Hospital Stay (HOSPITAL_COMMUNITY)
Admission: EM | Admit: 2017-12-05 | Discharge: 2017-12-09 | DRG: 391 | Disposition: A | Discharge status: Home Health | Payer: Medicare Other | Attending: Family Medicine | Admitting: Family Medicine

## 2017-12-05 DIAGNOSIS — M1990 Unspecified osteoarthritis, unspecified site: Secondary | ICD-10-CM | POA: Diagnosis present

## 2017-12-05 DIAGNOSIS — M6281 Muscle weakness (generalized): Secondary | ICD-10-CM | POA: Diagnosis not present

## 2017-12-05 DIAGNOSIS — E876 Hypokalemia: Secondary | ICD-10-CM | POA: Diagnosis not present

## 2017-12-05 DIAGNOSIS — I359 Nonrheumatic aortic valve disorder, unspecified: Secondary | ICD-10-CM

## 2017-12-05 DIAGNOSIS — R3129 Other microscopic hematuria: Secondary | ICD-10-CM | POA: Diagnosis present

## 2017-12-05 DIAGNOSIS — I10 Essential (primary) hypertension: Secondary | ICD-10-CM | POA: Diagnosis not present

## 2017-12-05 DIAGNOSIS — I951 Orthostatic hypotension: Secondary | ICD-10-CM | POA: Diagnosis not present

## 2017-12-05 DIAGNOSIS — R531 Weakness: Secondary | ICD-10-CM

## 2017-12-05 DIAGNOSIS — R110 Nausea: Secondary | ICD-10-CM | POA: Diagnosis not present

## 2017-12-05 DIAGNOSIS — Z8249 Family history of ischemic heart disease and other diseases of the circulatory system: Secondary | ICD-10-CM

## 2017-12-05 DIAGNOSIS — Z79899 Other long term (current) drug therapy: Secondary | ICD-10-CM

## 2017-12-05 DIAGNOSIS — K529 Noninfective gastroenteritis and colitis, unspecified: Secondary | ICD-10-CM | POA: Diagnosis present

## 2017-12-05 DIAGNOSIS — I2699 Other pulmonary embolism without acute cor pulmonale: Secondary | ICD-10-CM | POA: Diagnosis not present

## 2017-12-05 DIAGNOSIS — Z96653 Presence of artificial knee joint, bilateral: Secondary | ICD-10-CM | POA: Diagnosis present

## 2017-12-05 DIAGNOSIS — F329 Major depressive disorder, single episode, unspecified: Secondary | ICD-10-CM | POA: Diagnosis not present

## 2017-12-05 DIAGNOSIS — E785 Hyperlipidemia, unspecified: Secondary | ICD-10-CM | POA: Diagnosis present

## 2017-12-05 DIAGNOSIS — R197 Diarrhea, unspecified: Secondary | ICD-10-CM | POA: Diagnosis not present

## 2017-12-05 DIAGNOSIS — K219 Gastro-esophageal reflux disease without esophagitis: Secondary | ICD-10-CM | POA: Diagnosis present

## 2017-12-05 DIAGNOSIS — K5792 Diverticulitis of intestine, part unspecified, without perforation or abscess without bleeding: Secondary | ICD-10-CM | POA: Diagnosis present

## 2017-12-05 DIAGNOSIS — Z7901 Long term (current) use of anticoagulants: Secondary | ICD-10-CM

## 2017-12-05 DIAGNOSIS — Z888 Allergy status to other drugs, medicaments and biological substances status: Secondary | ICD-10-CM

## 2017-12-05 DIAGNOSIS — Z8673 Personal history of transient ischemic attack (TIA), and cerebral infarction without residual deficits: Secondary | ICD-10-CM

## 2017-12-05 DIAGNOSIS — K5732 Diverticulitis of large intestine without perforation or abscess without bleeding: Secondary | ICD-10-CM | POA: Diagnosis not present

## 2017-12-05 DIAGNOSIS — K859 Acute pancreatitis without necrosis or infection, unspecified: Secondary | ICD-10-CM

## 2017-12-05 DIAGNOSIS — I4891 Unspecified atrial fibrillation: Secondary | ICD-10-CM | POA: Diagnosis present

## 2017-12-05 DIAGNOSIS — I2782 Chronic pulmonary embolism: Secondary | ICD-10-CM | POA: Diagnosis not present

## 2017-12-05 DIAGNOSIS — N329 Bladder disorder, unspecified: Secondary | ICD-10-CM | POA: Diagnosis present

## 2017-12-05 DIAGNOSIS — R000 Tachycardia, unspecified: Secondary | ICD-10-CM | POA: Diagnosis not present

## 2017-12-05 DIAGNOSIS — R9431 Abnormal electrocardiogram [ECG] [EKG]: Secondary | ICD-10-CM | POA: Diagnosis not present

## 2017-12-05 DIAGNOSIS — Z86711 Personal history of pulmonary embolism: Secondary | ICD-10-CM | POA: Diagnosis not present

## 2017-12-05 DIAGNOSIS — Z885 Allergy status to narcotic agent status: Secondary | ICD-10-CM

## 2017-12-05 DIAGNOSIS — Z7984 Long term (current) use of oral hypoglycemic drugs: Secondary | ICD-10-CM

## 2017-12-05 DIAGNOSIS — E119 Type 2 diabetes mellitus without complications: Secondary | ICD-10-CM | POA: Diagnosis not present

## 2017-12-05 DIAGNOSIS — Z87891 Personal history of nicotine dependence: Secondary | ICD-10-CM

## 2017-12-05 LAB — CBC
HEMATOCRIT: 36.9 % (ref 36.0–46.0)
Hemoglobin: 10.9 g/dL — ABNORMAL LOW (ref 12.0–15.0)
MCH: 26.7 pg (ref 26.0–34.0)
MCHC: 29.5 g/dL — AB (ref 30.0–36.0)
MCV: 90.2 fL (ref 78.0–100.0)
Platelets: 142 10*3/uL — ABNORMAL LOW (ref 150–400)
RBC: 4.09 MIL/uL (ref 3.87–5.11)
RDW: 13.6 % (ref 11.5–15.5)
WBC: 10.7 10*3/uL — ABNORMAL HIGH (ref 4.0–10.5)

## 2017-12-05 LAB — C DIFFICILE QUICK SCREEN W PCR REFLEX
C DIFFICILE (CDIFF) INTERP: NOT DETECTED
C DIFFICILE (CDIFF) TOXIN: NEGATIVE
C DIFFICLE (CDIFF) ANTIGEN: NEGATIVE

## 2017-12-05 LAB — LIPASE, BLOOD: Lipase: 17 U/L (ref 11–51)

## 2017-12-05 LAB — COMPREHENSIVE METABOLIC PANEL
ALBUMIN: 2.6 g/dL — AB (ref 3.5–5.0)
ALK PHOS: 68 U/L (ref 38–126)
ALT: 11 U/L — ABNORMAL LOW (ref 14–54)
AST: 26 U/L (ref 15–41)
Anion gap: 11 (ref 5–15)
BILIRUBIN TOTAL: 0.8 mg/dL (ref 0.3–1.2)
BUN: 12 mg/dL (ref 6–20)
CALCIUM: 7.3 mg/dL — AB (ref 8.9–10.3)
CO2: 25 mmol/L (ref 22–32)
Chloride: 100 mmol/L — ABNORMAL LOW (ref 101–111)
Creatinine, Ser: 1 mg/dL (ref 0.44–1.00)
GFR calc Af Amer: 60 mL/min — ABNORMAL LOW (ref >60)
GFR calc non Af Amer: 51 mL/min — ABNORMAL LOW (ref >60)
GLUCOSE: 259 mg/dL — AB (ref 65–99)
Potassium: 2.2 mmol/L — CL (ref 3.5–5.1)
Sodium: 136 mmol/L (ref 135–145)
TOTAL PROTEIN: 5.2 g/dL — AB (ref 6.5–8.1)

## 2017-12-05 LAB — MAGNESIUM
MAGNESIUM: 1.1 mg/dL — AB (ref 1.7–2.4)
Magnesium: 1.8 mg/dL (ref 1.7–2.4)

## 2017-12-05 LAB — BASIC METABOLIC PANEL
ANION GAP: 7 (ref 5–15)
BUN: 13 mg/dL (ref 6–20)
CALCIUM: 7 mg/dL — AB (ref 8.9–10.3)
CHLORIDE: 104 mmol/L (ref 101–111)
CO2: 27 mmol/L (ref 22–32)
CREATININE: 0.99 mg/dL (ref 0.44–1.00)
GFR calc non Af Amer: 52 mL/min — ABNORMAL LOW (ref >60)
Glucose, Bld: 190 mg/dL — ABNORMAL HIGH (ref 65–99)
Potassium: 2.6 mmol/L — CL (ref 3.5–5.1)
SODIUM: 138 mmol/L (ref 135–145)

## 2017-12-05 LAB — GLUCOSE, CAPILLARY
Glucose-Capillary: 168 mg/dL — ABNORMAL HIGH (ref 65–99)
Glucose-Capillary: 215 mg/dL — ABNORMAL HIGH (ref 65–99)

## 2017-12-05 LAB — TSH: TSH: 0.935 u[IU]/mL (ref 0.350–4.500)

## 2017-12-05 MED ORDER — MAGNESIUM SULFATE 2 GM/50ML IV SOLN
2.0000 g | Freq: Once | INTRAVENOUS | Status: AC
  Administered 2017-12-05: 2 g via INTRAVENOUS
  Filled 2017-12-05 (×2): qty 50

## 2017-12-05 MED ORDER — ACETAMINOPHEN 500 MG PO TABS
1000.0000 mg | ORAL_TABLET | Freq: Four times a day (QID) | ORAL | Status: DC | PRN
  Administered 2017-12-08: 1000 mg via ORAL
  Filled 2017-12-05: qty 2

## 2017-12-05 MED ORDER — GLIPIZIDE ER 2.5 MG PO TB24
2.5000 mg | ORAL_TABLET | Freq: Every day | ORAL | Status: DC
  Administered 2017-12-06: 2.5 mg via ORAL
  Filled 2017-12-05: qty 1

## 2017-12-05 MED ORDER — POTASSIUM CHLORIDE 10 MEQ/100ML IV SOLN
10.0000 meq | INTRAVENOUS | Status: AC
  Administered 2017-12-05 (×5): 10 meq via INTRAVENOUS
  Filled 2017-12-05 (×6): qty 100

## 2017-12-05 MED ORDER — POTASSIUM CHLORIDE IN NACL 40-0.9 MEQ/L-% IV SOLN
INTRAVENOUS | Status: AC
  Administered 2017-12-05: 75 mL/h via INTRAVENOUS
  Filled 2017-12-05 (×2): qty 1000

## 2017-12-05 MED ORDER — VITAMIN B-12 1000 MCG PO TABS
1000.0000 ug | ORAL_TABLET | Freq: Every day | ORAL | Status: DC
  Administered 2017-12-05 – 2017-12-09 (×5): 1000 ug via ORAL
  Filled 2017-12-05 (×5): qty 1

## 2017-12-05 MED ORDER — SODIUM CHLORIDE 0.9 % IV BOLUS (SEPSIS)
1000.0000 mL | Freq: Once | INTRAVENOUS | Status: AC
  Administered 2017-12-05: 1000 mL via INTRAVENOUS

## 2017-12-05 MED ORDER — ACETAMINOPHEN 500 MG PO TABS
1000.0000 mg | ORAL_TABLET | Freq: Once | ORAL | Status: AC
  Administered 2017-12-05: 1000 mg via ORAL
  Filled 2017-12-05: qty 2

## 2017-12-05 MED ORDER — ONDANSETRON HCL 4 MG/2ML IJ SOLN: 4.0000 mg | Freq: Four times a day (QID) | INTRAMUSCULAR | Status: DC | PRN

## 2017-12-05 MED ORDER — APIXABAN 5 MG PO TABS
5.0000 mg | ORAL_TABLET | Freq: Two times a day (BID) | ORAL | Status: DC
Start: 2017-12-05 — End: 2017-12-09
  Administered 2017-12-05 – 2017-12-09 (×8): 5 mg via ORAL
  Filled 2017-12-05 (×8): qty 1

## 2017-12-05 MED ORDER — FLUDROCORTISONE ACETATE 0.1 MG PO TABS
0.1000 mg | ORAL_TABLET | Freq: Every day | ORAL | Status: DC
  Administered 2017-12-05 – 2017-12-09 (×5): 0.1 mg via ORAL
  Filled 2017-12-05 (×5): qty 1

## 2017-12-05 MED ORDER — PANTOPRAZOLE SODIUM 40 MG PO TBEC
40.0000 mg | DELAYED_RELEASE_TABLET | Freq: Every day | ORAL | Status: DC
  Administered 2017-12-05 – 2017-12-09 (×5): 40 mg via ORAL
  Filled 2017-12-05 (×5): qty 1

## 2017-12-05 MED ORDER — ONDANSETRON HCL 4 MG PO TABS: 4.0000 mg | ORAL_TABLET | Freq: Four times a day (QID) | ORAL | Status: DC | PRN

## 2017-12-05 MED ORDER — INSULIN ASPART 100 UNIT/ML ~~LOC~~ SOLN
0.0000 [IU] | Freq: Three times a day (TID) | SUBCUTANEOUS | Status: DC
  Administered 2017-12-05: 2 [IU] via SUBCUTANEOUS
  Administered 2017-12-06: 3 [IU] via SUBCUTANEOUS
  Administered 2017-12-06: 1 [IU] via SUBCUTANEOUS
  Administered 2017-12-07: 2 [IU] via SUBCUTANEOUS
  Administered 2017-12-07: 3 [IU] via SUBCUTANEOUS
  Administered 2017-12-08: 1 [IU] via SUBCUTANEOUS
  Administered 2017-12-08 (×2): 2 [IU] via SUBCUTANEOUS
  Administered 2017-12-09: 3 [IU] via SUBCUTANEOUS

## 2017-12-05 MED ORDER — AMLODIPINE BESYLATE 5 MG PO TABS
2.5000 mg | ORAL_TABLET | Freq: Every day | ORAL | Status: DC
  Administered 2017-12-05 – 2017-12-09 (×5): 2.5 mg via ORAL
  Filled 2017-12-05 (×5): qty 1

## 2017-12-05 NOTE — Care Management Note (Signed)
Case Management Note  Patient Details  Name: [name] MRN: [id number] Date of Birth: Aug 29, 1934  Subjective/Objective:                  83 y.o. female c/o weakness secondary to diarrhea  Action/Plan: CM spoke with pt and family who states pt is active with [facility] PT.  Family mentioned plan to change PCP and also concerns for transportation to future appointments.  CM advised that the [facility] team can assisted with those concerns.  CM contacted Katrina with [facility] who states she will follow pt for needs and discusss with family the possibility of having a PCP come to the pt's home for visits.  CM will follow inpt as needed.  Expected Discharge Date:  (unknown)               Expected Discharge Plan:  [place]  Post Acute Care Choice:  Home Health Choice offered to:  Patient, Adult Children  HH Arranged:  RN, PT HH Agency:  [facility]  Status of Service:  In process, will continue to follow  Rae Mar, RN 12/05/2017, 3:45 PM

## 2017-12-05 NOTE — ED Notes (Signed)
Bed: KN39 Expected date:  Expected time:  Means of arrival:  Comments: EMS-dehydration

## 2017-12-05 NOTE — ED Notes (Signed)
RN went to pt's room after pt rang the call bell for assistance. PT reported she need to have a BM. RN requested that pt use a bedpan, and pt said no. Pt requested that RN help walk pt to the bathroom and RN assisted. Pt felt weak during walking and Abigail Butts, RN assisted on other side of pt for safety measures. Pt made it to the bathroom without incident.  Pt's family reported to NT that they were angry pt had been walked in her weakened state.  Pt was assisted back to bed with the use of the steady without incident.

## 2017-12-05 NOTE — H&P (Signed)
History and Physical    [name] PIR:[id number] DOB: August 31, 1934 DOA: 12/05/2017  PCP: Marletta Lor, MD  Patient coming from:  Home   Chief Complaint:  weakness  HPI: [name] is a 83 y.o. female with medical history significant of chronic diarrhea for over 5 months not fully worked up, CVA, HTN, HLD, PE on eloquis, GERD, DM on metformin since 2008 comes in with generalized weakness.  She has been hospitalized recently for the same problem last month, but no report or mention of significant diarrhea then.  She also recently dx with PE.  She denies any fevers.  No nausea or vomiting.  She is vague about the diarrhea.  Not bloody.  No abdominal pain.  She has not had any stool studies done.  She has not seen a doctor about her diarrhea for over 5 months.  No new meds except blood thinner for her PE.  Pt today found to have k level of 2.2 and low mag level of 1.1.  Referred for admission for her electrolyte abnormalities.  Review of Systems: As per HPI otherwise 10 point review of systems negative.   Past Medical History:  Diagnosis Date  . Aortic stenosis   . CVA (cerebral infarction)   . Depression   . Diabetes mellitus   . DVT (deep venous thrombosis) ([place])   . GERD (gastroesophageal reflux disease)   . Hyperlipidemia   . Hypertension   . Osteoarthritis   . Pulmonary embolism [facility])     Past Surgical History:  Procedure Laterality Date  . ABDOMINAL HYSTERECTOMY     fibroids, no cancer  . FRACTURE SURGERY     left leg  . JOINT REPLACEMENT     left and right knee  . ROTATOR CUFF REPAIR    . TEMPOROMANDIBULAR JOINT SURGERY       reports that she quit smoking about 36 years ago. she has never used smokeless tobacco. She reports that she does not drink alcohol or use drugs.  Allergies  Allergen Reactions  . Cymbalta [Duloxetine Hcl] Anaphylaxis  . Oxycodone Other (See Comments)    Nightmares  . Ramipril Other (See Comments)    REACTION: difficulty  breathing  . Hydrochlorothiazide Rash  . Telmisartan-Hctz Rash    Family History  Problem Relation Age of Onset  . Dementia Mother   . Hypertension Mother   . Heart attack Father   . Heart failure Sister     Prior to Admission medications   Medication Sig Start Date End Date Taking? Authorizing Provider  acetaminophen (TYLENOL) 500 MG tablet Take 1,000 mg by mouth every 6 (six) hours as needed for mild pain.   Yes [provider]  amLODipine (NORVASC) 5 MG tablet Take 0.5 tablets (2.5 mg total) daily by mouth. 10/27/17  Yes Alekh, Kshitiz, MD  apixaban (ELIQUIS) 5 MG TABS tablet Take 1 tablet (5 mg total) by mouth 2 (two) times daily. 11/06/17  Yes Marletta Lor, MD  atorvastatin (LIPITOR) 20 MG tablet TAKE 1 TABLET (20 MG TOTAL) BY MOUTH DAILY. 11/13/17  Yes Marletta Lor, MD  cyanocobalamin 1000 MCG tablet Take 1,000 mcg by mouth daily.   Yes [provider]  esomeprazole (NEXIUM) 40 MG capsule Take 1 capsule (40 mg total) by mouth daily. 11/15/17  Yes Marletta Lor, MD  fludrocortisone (FLORINEF) 0.1 MG tablet Take 0.5 tablets (0.05 mg total) by mouth daily. Patient taking differently: Take 0.1 mg by mouth daily.  11/15/17  Yes Bluford Kaufmann  F, MD  glipiZIDE (GLUCOTROL XL) 2.5 MG 24 hr tablet TAKE 1 TABLET (2.5 MG TOTAL) BY MOUTH DAILY WITH BREAKFAST. 11/21/17  Yes Marletta Lor, MD  imipramine (TOFRANIL) 25 MG tablet TAKE 1 TABLET BY MOUTH EVERY DAY Patient taking differently: TAKE 82m BY MOUTH EVERY DAY 11/13/17  Yes KMarletta Lor MD  magnesium oxide (MAG-OX) 400 MG tablet TAKE 1 TABLET (400 MG TOTAL) BY MOUTH 3 (THREE) TIMES DAILY WITH MEALS. 11/28/17  Yes KMarletta Lor MD  metFORMIN (GLUCOPHAGE) 500 MG tablet Take 1 tablet (500 mg total) daily with breakfast by mouth. 10/16/17  Yes KMarletta Lor MD  Blood Glucose Monitoring Suppl (ONE TOUCH ULTRA 2) w/Device KIT 1 Device 3 (three) times daily before meals by  Does not apply route. Dx :E11.9 10/23/17   KMarletta Lor MD  glucose blood test strip Use to check CBG TID. Dx: E11.9 10/23/17   KMarletta Lor MD  Lancet Device MISC Use to check blood sugar three times a day before meals 11/05/17   KMarletta Lor MD  Lancets ([facility]) lancets Use to check CBG TID. Dx : E11.9 10/23/17   KMarletta Lor MD    Physical Exam: Vitals:   12/05/17 1146 12/05/17 1235 12/05/17 1433  BP: (!) 142/85 124/63 (!) 142/103  Pulse: 78 (!) 116 97  Resp: 18 19 (!) 23  Temp: 98.5 F (36.9 C)    SpO2: 99% 95% 100%      Constitutional: NAD, calm, comfortable Vitals:   12/05/17 1146 12/05/17 1235 12/05/17 1433  BP: (!) 142/85 124/63 (!) 142/103  Pulse: 78 (!) 116 97  Resp: 18 19 (!) 23  Temp: 98.5 F (36.9 C)    SpO2: 99% 95% 100%   Eyes: PERRL, lids and conjunctivae normal ENMT: Mucous membranes are moist. Posterior pharynx clear of any exudate or lesions.Normal dentition.  Neck: normal, supple, no masses, no thyromegaly Respiratory: clear to auscultation bilaterally, no wheezing, no crackles. Normal respiratory effort. No accessory muscle use.  Cardiovascular: Regular rate and rhythm, no murmurs / rubs / gallops. No extremity edema. 2+ pedal pulses. No carotid bruits.  Abdomen: no tenderness, no masses palpated. No hepatosplenomegaly. Bowel sounds positive.  Musculoskeletal: no clubbing / cyanosis. No joint deformity upper and lower extremities. Good ROM, no contractures. Normal muscle tone.  Skin: no rashes, lesions, ulcers. No induration Neurologic: CN 2-12 grossly intact. Sensation intact, DTR normal. Strength 5/5 in all 4.  Psychiatric: Normal judgment and insight. Alert and oriented x 3. Normal mood.    Labs on Admission: I have personally reviewed following labs and imaging studies  CBC: Recent Labs  Lab 12/05/17 1155  WBC 10.7*  HGB 10.9*  HCT 36.9  MCV 90.2  PLT 1428   Basic Metabolic Panel: Recent  Labs  Lab 12/05/17 1155  NA 136  K 2.2*  CL 100*  CO2 25  GLUCOSE 259*  BUN 12  CREATININE 1.00  CALCIUM 7.3*  MG 1.1*   GFR: CrCl cannot be calculated (Unknown ideal weight.). Liver Function Tests: Recent Labs  Lab 12/05/17 1155  AST 26  ALT 11*  ALKPHOS 68  BILITOT 0.8  PROT 5.2*  ALBUMIN 2.6*   Recent Labs  Lab 12/05/17 1155  LIPASE 17   No results for input(s): AMMONIA in the last 168 hours. Coagulation Profile: No results for input(s): INR, PROTIME in the last 168 hours. Cardiac Enzymes: No results for input(s): CKTOTAL, CKMB, CKMBINDEX, TROPONINI in the last 168 hours.  BNP (last 3 results) No results for input(s): PROBNP in the last 8760 hours. HbA1C: No results for input(s): HGBA1C in the last 72 hours. CBG: No results for input(s): GLUCAP in the last 168 hours. Lipid Profile: No results for input(s): CHOL, HDL, LDLCALC, TRIG, CHOLHDL, LDLDIRECT in the last 72 hours. Thyroid Function Tests: No results for input(s): TSH, T4TOTAL, FREET4, T3FREE, THYROIDAB in the last 72 hours. Anemia Panel: No results for input(s): VITAMINB12, FOLATE, FERRITIN, TIBC, IRON, RETICCTPCT in the last 72 hours. Urine analysis:    Component Value Date/Time   COLORURINE YELLOW 10/26/2017 0800   APPEARANCEUR CLEAR 10/26/2017 0800   LABSPEC 1.013 10/26/2017 0800   PHURINE 5.0 10/26/2017 0800   GLUCOSEU >=500 (A) 10/26/2017 0800   HGBUR SMALL (A) 10/26/2017 0800   HGBUR negative 08/21/2007 0839   BILIRUBINUR NEGATIVE 10/26/2017 0800   KETONESUR 5 (A) 10/26/2017 0800   PROTEINUR NEGATIVE 10/26/2017 0800   UROBILINOGEN 0.2 03/22/2015 2212   NITRITE NEGATIVE 10/26/2017 0800   LEUKOCYTESUR NEGATIVE 10/26/2017 0800   Sepsis Labs: !!!!!!!!!!!!!!!!!!!!!!!!!!!!!!!!!!!!!!!!!!!! _0 (procalcitonin:4,lacticidven:4) ) Recent Results (from the past 240 hour(s))  C difficile quick scan w PCR reflex     Status: None   Collection Time: 12/05/17 12:30 PM  Result Value Ref Range  Status   C Diff antigen NEGATIVE NEGATIVE Final    Comment: NEGATIVE NEGATIVE    C Diff toxin NEGATIVE NEGATIVE Final   C Diff interpretation No C. difficile detected.  Final    Comment: NEGATIVE     Radiological Exams on Admission: No results found.  EKG: Independently reviewed. afib rate 107 Old chart reviewed Case discussed with edp   Assessment/Plan 83 yo female with generalized weakness comes in with hypokalemia and hypomagnesime  Principal Problem:   Hypokalemia- kcl 42mq iv given over next 5 hours.  Replete mag.  Repeat bmp later today and replete further if needed.  Cardiac monitoring.  May be causing new afib.  Active Problems:   Essential hypertension- cont norvasc   Aortic valve disorder- noted   Pulmonary embolism (HCC)- cont eloquis   Hypomagnesemia- mag sulfate 2gm iv given now, repeat mag level later this evening with bmp   Chronic diarrhea- ck stool cultures and cdiff.  No fevers.  No abd pain.  Monitor how much she is having, may need GI eval as outpt   Weakness generalized- due to above issues   New afib- correct k and mag levels.  Obtain cardiac echo.  If persists despite correcting lytes considering adding bblocker or ccb to medications, ck tsh   DVT prophylaxis:  eloquis Code Status:  full Family Communication:  daughter Disposition Plan:  Per day team Consults called:  none Admission status:  observation   DAVID,RACHAL A MD [facility]  If 7PM-7AM, please contact night-coverage [email or web address] Password [email or web address]  12/05/2017, 3:24 PM

## 2017-12-05 NOTE — Care Management Obs Status (Signed)
MEDICARE OBSERVATION STATUS NOTIFICATION   Patient Details  Name: [name] MRN: [id number] Date of Birth: Apr 19, 1934   Medicare Observation Status Notification Given:  Yes    Yecheskel Kurek, Benjaman Lobe, RN 12/05/2017, 3:31 PM

## 2017-12-05 NOTE — ED Notes (Signed)
Pt's IV was burning due to the potassium. Fluids added to decrease burning sensation since fluid bolus had been completed

## 2017-12-05 NOTE — ED Notes (Signed)
Pt requesting tylenol for a headache. RN left a message for MD requesting tylenol

## 2017-12-05 NOTE — Progress Notes (Signed)
CRITICAL VALUE ALERT  Critical Value:  Potassium 2.6  Date & Time Notied:  12/05/17 1810  Provider Notified: Dr Shanon Brow  Orders Received/Actions taken:

## 2017-12-05 NOTE — ED Triage Notes (Signed)
Per EMS: Pt bib EMS with c/o weakness secondary to diarrhea. Pt also reports having several falls within the last month with no injury. Pt reports she feels like she is taking longer to feel better and has been having intermittent bouts of diarrhea for the last 2 months.    BP124/62 HR 84 RR 16 SPO2 98% on RA 210 CBG

## 2017-12-05 NOTE — ED Provider Notes (Signed)
[name] Provider Note   CSN: [id number] Arrival date & time: 12/05/17  1135     History   Chief Complaint Chief Complaint  Patient presents with  . Weakness  . Diarrhea    HPI [name] is a 83 y.o. female.  HPI  83yo female presents with concern for generalized weakness and diarrhea. Reports diarrhea began 3-4 months ago, however has been worsening over the last month. Reports about 5 episodes per day. Denies fevers, vomiting, numbness, weakness, urinary symptoms.  Reports severe fatigue. Reports feeling severe fatigue at least while walking to the bathroom, feels lightheadedness and nausea. Thinks diarrhea could be secondary to metformin.   Past Medical History:  Diagnosis Date  . Aortic stenosis   . CVA (cerebral infarction)   . Depression   . Diabetes mellitus   . DVT (deep venous thrombosis) ([place])   . GERD (gastroesophageal reflux disease)   . Hyperlipidemia   . Hypertension   . Osteoarthritis   . Pulmonary embolism [facility])     Patient Active Problem List   Diagnosis Date Noted  . Chronic diarrhea 12/05/2017  . Weakness generalized 12/05/2017  . Orthostatic hypotension   . Hypokalemia   . Hypomagnesemia   . Electrolyte abnormality 10/26/2017  . Electrolyte imbalance 10/26/2017  . Blood pressure alteration   . Pulmonary embolism ([place]) 10/18/2017  . Diabetes mellitus with neuropathy ([place]) 04/21/2016  . OTHER ANXIETY STATES 11/01/2007  . Aortic valve disorder 08/14/2007  . Osteoarthritis 08/14/2007  . VERTIGO 07/30/2007  . Dyslipidemia 10/21/2006  . Depression, recurrent ([place]) 10/21/2006  . Essential hypertension 10/21/2006  . GERD 10/21/2006  . History of cardiovascular disorder 10/21/2006    Past Surgical History:  Procedure Laterality Date  . ABDOMINAL HYSTERECTOMY     fibroids, no cancer  . FRACTURE SURGERY     left leg  . JOINT REPLACEMENT     left and right knee  . ROTATOR CUFF REPAIR      . TEMPOROMANDIBULAR JOINT SURGERY      OB History    No data available       Home Medications    Prior to Admission medications   Medication Sig Start Date End Date Taking? Authorizing Provider  acetaminophen (TYLENOL) 500 MG tablet Take 1,000 mg by mouth every 6 (six) hours as needed for mild pain.   Yes [provider]  amLODipine (NORVASC) 5 MG tablet Take 0.5 tablets (2.5 mg total) daily by mouth. 10/27/17  Yes Alekh, Kshitiz, MD  apixaban (ELIQUIS) 5 MG TABS tablet Take 1 tablet (5 mg total) by mouth 2 (two) times daily. 11/06/17  Yes Marletta Lor, MD  atorvastatin (LIPITOR) 20 MG tablet TAKE 1 TABLET (20 MG TOTAL) BY MOUTH DAILY. 11/13/17  Yes Marletta Lor, MD  cyanocobalamin 1000 MCG tablet Take 1,000 mcg by mouth daily.   Yes [provider]  esomeprazole (NEXIUM) 40 MG capsule Take 1 capsule (40 mg total) by mouth daily. 11/15/17  Yes Marletta Lor, MD  fludrocortisone (FLORINEF) 0.1 MG tablet Take 0.5 tablets (0.05 mg total) by mouth daily. Patient taking differently: Take 0.1 mg by mouth daily.  11/15/17  Yes Marletta Lor, MD  glipiZIDE (GLUCOTROL XL) 2.5 MG 24 hr tablet TAKE 1 TABLET (2.5 MG TOTAL) BY MOUTH DAILY WITH BREAKFAST. 11/21/17  Yes Marletta Lor, MD  imipramine (TOFRANIL) 25 MG tablet TAKE 1 TABLET BY MOUTH EVERY DAY Patient taking differently: TAKE 5m BY MOUTH EVERY DAY  11/13/17  Yes Marletta Lor, MD  magnesium oxide (MAG-OX) 400 MG tablet TAKE 1 TABLET (400 MG TOTAL) BY MOUTH 3 (THREE) TIMES DAILY WITH MEALS. 11/28/17  Yes Marletta Lor, MD  metFORMIN (GLUCOPHAGE) 500 MG tablet Take 1 tablet (500 mg total) daily with breakfast by mouth. 10/16/17  Yes Marletta Lor, MD  Blood Glucose Monitoring Suppl (ONE TOUCH ULTRA 2) w/Device KIT 1 Device 3 (three) times daily before meals by Does not apply route. Dx :E11.9 10/23/17   Marletta Lor, MD  glucose blood test strip Use to check CBG  TID. Dx: E11.9 10/23/17   Marletta Lor, MD  Lancet Device MISC Use to check blood sugar three times a day before meals 11/05/17   Marletta Lor, MD  Lancets [facility] ULTRASOFT) lancets Use to check CBG TID. Dx : E11.9 10/23/17   Marletta Lor, MD    Family History Family History  Problem Relation Age of Onset  . Dementia Mother   . Hypertension Mother   . Heart attack Father   . Heart failure Sister     Social History Social History   Tobacco Use  . Smoking status: Former Smoker    Last attempt to quit: 02/07/1981    Years since quitting: 36.8  . Smokeless tobacco: Never Used  Substance Use Topics  . Alcohol use: No  . Drug use: No     Allergies   Cymbalta [duloxetine hcl]; Oxycodone; Ramipril; Hydrochlorothiazide; and Telmisartan-hctz   Review of Systems Review of Systems  Constitutional: Positive for fatigue. Negative for fever.  HENT: Negative for sore throat.   Eyes: Negative for visual disturbance.  Respiratory: Negative for cough and shortness of breath.   Cardiovascular: Negative for chest pain.  Gastrointestinal: Positive for diarrhea. Negative for abdominal pain and vomiting.  Genitourinary: Negative for difficulty urinating and dysuria.  Musculoskeletal: Negative for back pain and neck pain.  Skin: Negative for rash.  Neurological: Positive for weakness (generalized). Negative for syncope and headaches.     Physical Exam Updated Vital Signs BP 124/79 (BP Location: Right Arm)   Pulse 87   Temp 98.1 F (36.7 C) (Oral)   Resp 18   Ht _0  (1.6 m)   Wt 75 kg (165 lb 5.5 oz)   SpO2 97%   BMI 29.29 kg/m   Physical Exam  Constitutional: She is oriented to person, place, and time. She appears well-developed and well-nourished. No distress.  HENT:  Head: Normocephalic and atraumatic.  Eyes: Conjunctivae and EOM are normal.  Neck: Normal range of motion.  Cardiovascular: Normal rate, regular rhythm, normal heart sounds and  intact distal pulses. Exam reveals no gallop and no friction rub.  No murmur heard. Pulmonary/Chest: Effort normal and breath sounds normal. No respiratory distress. She has no wheezes. She has no rales.  Abdominal: Soft. She exhibits no distension. There is no tenderness. There is no guarding.  Musculoskeletal: She exhibits no edema or tenderness.  Neurological: She is alert and oriented to person, place, and time.  Skin: Skin is warm and dry. No rash noted. She is not diaphoretic. No erythema.  Nursing note and vitals reviewed.    ED Treatments / Results  Labs (all labs ordered are listed, but only abnormal results are displayed) Labs Reviewed  COMPREHENSIVE METABOLIC PANEL - Abnormal; Notable for the following components:      Result Value   Potassium 2.2 (*)    Chloride 100 (*)    Glucose, Bld 259 (*)  Calcium 7.3 (*)    Total Protein 5.2 (*)    Albumin 2.6 (*)    ALT 11 (*)    GFR calc non Af Amer 51 (*)    GFR calc Af Amer 60 (*)    All other components within normal limits  CBC - Abnormal; Notable for the following components:   WBC 10.7 (*)    Hemoglobin 10.9 (*)    MCHC 29.5 (*)    Platelets 142 (*)    All other components within normal limits  MAGNESIUM - Abnormal; Notable for the following components:   Magnesium 1.1 (*)    All other components within normal limits  BASIC METABOLIC PANEL - Abnormal; Notable for the following components:   Potassium 2.6 (*)    Glucose, Bld 190 (*)    Calcium 7.0 (*)    GFR calc non Af Amer 52 (*)    All other components within normal limits  GLUCOSE, CAPILLARY - Abnormal; Notable for the following components:   Glucose-Capillary 168 (*)    All other components within normal limits  GLUCOSE, CAPILLARY - Abnormal; Notable for the following components:   Glucose-Capillary 215 (*)    All other components within normal limits  C DIFFICILE QUICK SCREEN W PCR REFLEX  GASTROINTESTINAL PANEL BY PCR, STOOL (REPLACES STOOL CULTURE)   LIPASE, BLOOD  MAGNESIUM  TSH  URINALYSIS, ROUTINE W REFLEX MICROSCOPIC  BASIC METABOLIC PANEL    EKG  EKG Interpretation  Date/Time:  Wednesday December 05 2017 11:56:53 EST Ventricular Rate:  107 PR Interval:    QRS Duration: 90 QT Interval:  319 QTC Calculation: 426 R Axis:   28 Text Interpretation:  Atrial fibrillation Paired ventricular premature complexes Low voltage, precordial leads RSR' in V1 or V2, right VCD or RVH Repol abnrm suggests ischemia, lateral leads Since prior ECG, atrial fibrillation is new Confirmed by Gareth Morgan [phone]) on 12/05/2017 12:54:13 PM       Radiology No results found.  Procedures .Critical Care Performed by: Gareth Morgan, MD Authorized by: Gareth Morgan, MD   Comments:     CRITICAL CARE: hypokalemia Performed by: Tennis Must   Total critical care time: 30 minutes  Critical care time was exclusive of separately billable procedures and treating other patients.  Critical care was necessary to treat or prevent imminent or life-threatening deterioration.  Critical care was time spent personally by me on the following activities: development of treatment plan with patient and/or surrogate as well as nursing, discussions with consultants, evaluation of patient's response to treatment, examination of patient, obtaining history from patient or surrogate, ordering and performing treatments and interventions, ordering and review of laboratory studies, ordering and review of radiographic studies, pulse oximetry and re-evaluation of patient's condition.    (including critical care time)  Medications Ordered in ED Medications  amLODipine (NORVASC) tablet 2.5 mg (2.5 mg Oral Given 12/05/17 1650)  apixaban (ELIQUIS) tablet 5 mg (not administered)  fludrocortisone (FLORINEF) tablet 0.1 mg (0.1 mg Oral Given 12/05/17 1650)  pantoprazole (PROTONIX) EC tablet 40 mg (40 mg Oral Given 12/05/17 1650)  glipiZIDE (GLUCOTROL XL) 24 hr  tablet 2.5 mg (not administered)  vitamin B-12 (CYANOCOBALAMIN) tablet 1,000 mcg (1,000 mcg Oral Given 12/05/17 1650)  acetaminophen (TYLENOL) tablet 1,000 mg (not administered)  0.9 % NaCl with KCl 40 mEq / L  infusion (75 mL/hr Intravenous New Bag/Given 12/05/17 1617)  ondansetron (ZOFRAN) tablet 4 mg (not administered)    Or  ondansetron (ZOFRAN) injection 4 mg (not  administered)  insulin aspart (novoLOG) injection 0-9 Units (2 Units Subcutaneous Given 12/05/17 1650)  potassium chloride 10 mEq in 100 mL IVPB (0 mEq Intravenous Stopped 12/05/17 1904)  sodium chloride 0.9 % bolus 1,000 mL (0 mLs Intravenous Stopped 12/05/17 1516)  acetaminophen (TYLENOL) tablet 1,000 mg (1,000 mg Oral Given 12/05/17 1430)  magnesium sulfate IVPB 2 g 50 mL (0 g Intravenous Stopped 12/05/17 1718)     Initial Impression / Assessment and Plan / ED Course  I have reviewed the triage vital signs and the nursing notes.  Pertinent labs & imaging results that were available during my care of the patient were reviewed by me and considered in my medical decision making (see chart for details).      83yo female presents with concern for generalized weakness and diarrhea.  Potassium 2.2, likely secondary to diarrhea. Low potassium likely cause of generalized weakness.  Patient appears to be in new onset atrial fibrillation. Is on eliquis.  Afib likely in setting of electrolyte abnormality. Is rate controlled. Patient discharged in stable condition with understanding of reasons to return.   Final Clinical Impressions(s) / ED Diagnoses   Final diagnoses:  Hypokalemia  Diarrhea, unspecified type    ED Discharge Orders    None       Gareth Morgan, MD 12/05/17 2238

## 2017-12-05 NOTE — ED Notes (Signed)
ED Provider at bedside. 

## 2017-12-05 NOTE — ED Notes (Signed)
ED TO INPATIENT HANDOFF REPORT  Name/Age/Gender [name] 83 y.o. female  Code Status Code Status History    Date Active Date Inactive Code Status Order ID Comments User Context   10/26/2017 02:57 10/27/2017 15:00 DNR [id number]  Rise Patience, MD ED   10/18/2017 15:41 10/20/2017 21:11 DNR [id number]  Roxan Hockey, MD ED   10/18/2017 13:35 10/18/2017 15:41 Full Code [id number]  Roxan Hockey, MD ED    Questions for Most Recent Historical Code Status (Order [id number])    Question Answer Comment   In the event of cardiac or respiratory ARREST Do not call a "code blue"    In the event of cardiac or respiratory ARREST Do not perform Intubation, CPR, defibrillation or ACLS    In the event of cardiac or respiratory ARREST Use medication by any route, position, wound care, and other measures to relive pain and suffering. May use oxygen, suction and manual treatment of airway obstruction as needed for comfort.       Home/SNF/Other Home  Chief Complaint Weakness  Level of Care/Admitting Diagnosis ED Disposition    ED Disposition Condition Comment   Admit  Hospital Area: [place] [[id number]]  Level of Care: Telemetry [5]  Admit to tele based on following criteria: Complex arrhythmia (Bradycardia/Tachycardia)  Diagnosis: Hypokalemia [962836]  Admitting Physician: Phillips Grout [[id number]]  Attending Physician: Derrill Kay A [[id number]]  PT Class (Do Not Modify): Observation [104]  PT Acc Code (Do Not Modify): Observation [10022]       Medical History Past Medical History:  Diagnosis Date  . Aortic stenosis   . CVA (cerebral infarction)   . Depression   . Diabetes mellitus   . DVT (deep venous thrombosis) ([place])   . GERD (gastroesophageal reflux disease)   . Hyperlipidemia   . Hypertension   . Osteoarthritis   . Pulmonary embolism ([facility])     Allergies Allergies  Allergen Reactions  . Cymbalta [Duloxetine Hcl] Anaphylaxis  . Oxycodone Other  (See Comments)    Nightmares  . Ramipril Other (See Comments)    REACTION: difficulty breathing  . Hydrochlorothiazide Rash  . Telmisartan-Hctz Rash    IV Location/Drains/Wounds Patient Lines/Drains/Airways Status   Active Line/Drains/Airways    Name:   Placement date:   Placement time:   Site:   Days:   Peripheral IV 10/26/17 Left Forearm   10/26/17    0209    Forearm   40   Peripheral IV 12/05/17 Left Hand   12/05/17    1211    Hand   less than 1          Labs/Imaging Results for orders placed or performed during the hospital encounter of 12/05/17 (from the past 48 hour(s))  Lipase, blood     Status: None   Collection Time: 12/05/17 11:55 AM  Result Value Ref Range   Lipase 17 11 - 51 U/L  Comprehensive metabolic panel     Status: Abnormal   Collection Time: 12/05/17 11:55 AM  Result Value Ref Range   Sodium 136 135 - 145 mmol/L   Potassium 2.2 (LL) 3.5 - 5.1 mmol/L    Comment: CRITICAL RESULT CALLED TO, READ BACK BY AND VERIFIED WITH: S WEST,RN 12/05/17 1244 RHOLMES    Chloride 100 (L) 101 - 111 mmol/L   CO2 25 22 - 32 mmol/L   Glucose, Bld 259 (H) 65 - 99 mg/dL   BUN 12 6 - 20 mg/dL   Creatinine, Ser 1.00 0.44 - 1.00  mg/dL   Calcium 7.3 (L) 8.9 - 10.3 mg/dL   Total Protein 5.2 (L) 6.5 - 8.1 g/dL   Albumin 2.6 (L) 3.5 - 5.0 g/dL   AST 26 15 - 41 U/L   ALT 11 (L) 14 - 54 U/L   Alkaline Phosphatase 68 38 - 126 U/L   Total Bilirubin 0.8 0.3 - 1.2 mg/dL   GFR calc non Af Amer 51 (L) >60 mL/min   GFR calc Af Amer 60 (L) >60 mL/min    Comment: (NOTE) The eGFR has been calculated using the CKD EPI equation. This calculation has not been validated in all clinical situations. eGFR's persistently <60 mL/min signify possible Chronic Kidney Disease.    Anion gap 11 5 - 15  CBC     Status: Abnormal   Collection Time: 12/05/17 11:55 AM  Result Value Ref Range   WBC 10.7 (H) 4.0 - 10.5 K/uL   RBC 4.09 3.87 - 5.11 MIL/uL   Hemoglobin 10.9 (L) 12.0 - 15.0 g/dL   HCT 36.9  36.0 - 46.0 %   MCV 90.2 78.0 - 100.0 fL   MCH 26.7 26.0 - 34.0 pg   MCHC 29.5 (L) 30.0 - 36.0 g/dL   RDW 13.6 11.5 - 15.5 %   Platelets 142 (L) 150 - 400 K/uL  Magnesium     Status: Abnormal   Collection Time: 12/05/17 11:55 AM  Result Value Ref Range   Magnesium 1.1 (L) 1.7 - 2.4 mg/dL  C difficile quick scan w PCR reflex     Status: None   Collection Time: 12/05/17 12:30 PM  Result Value Ref Range   C Diff antigen NEGATIVE NEGATIVE    Comment: NEGATIVE NEGATIVE    C Diff toxin NEGATIVE NEGATIVE   C Diff interpretation No C. difficile detected.     Comment: NEGATIVE   No results found.  Pending Labs Unresulted Labs (From admission, onward)   Start     Ordered   12/05/17 1412  Gastrointestinal Panel by PCR , Stool  (Gastrointestinal Panel by PCR, Stool)  Once,   R     12/05/17 1411   12/05/17 1155  Urinalysis, Routine w reflex microscopic  STAT,   STAT     12/05/17 1154   Signed and Held  Magnesium  Once,   R     Signed and Held   Signed and Held  Basic metabolic panel  Once,   R     Signed and Held   Signed and Held  Basic metabolic panel  Tomorrow morning,   R     Signed and Held      Vitals/Pain Today's Vitals   12/05/17 1146 12/05/17 1235 12/05/17 1433 12/05/17 1508  BP: (!) 142/85 124/63 (!) 142/103   Pulse: 78 (!) 116 97   Resp: 18 19 (!) 23   Temp: 98.5 F (36.9 C)     SpO2: 99% 95% 100%   PainSc:    4     Isolation Precautions Enteric precautions (UV disinfection)  Medications Medications  potassium chloride 10 mEq in 100 mL IVPB (10 mEq Intravenous New Bag/Given 12/05/17 1457)  magnesium sulfate IVPB 2 g 50 mL (not administered)  sodium chloride 0.9 % bolus 1,000 mL (0 mLs Intravenous Stopped 12/05/17 1516)  acetaminophen (TYLENOL) tablet 1,000 mg (1,000 mg Oral Given 12/05/17 1430)    Mobility walks with person assist *Pt uses walker at home

## 2017-12-05 NOTE — ED Notes (Signed)
RN/MD notified of abnormal lab 

## 2017-12-06 ENCOUNTER — Observation Stay (HOSPITAL_COMMUNITY): Payer: Medicare Other

## 2017-12-06 DIAGNOSIS — Z7901 Long term (current) use of anticoagulants: Secondary | ICD-10-CM | POA: Diagnosis not present

## 2017-12-06 DIAGNOSIS — R932 Abnormal findings on diagnostic imaging of liver and biliary tract: Secondary | ICD-10-CM | POA: Diagnosis not present

## 2017-12-06 DIAGNOSIS — N3289 Other specified disorders of bladder: Secondary | ICD-10-CM | POA: Diagnosis not present

## 2017-12-06 DIAGNOSIS — K529 Noninfective gastroenteritis and colitis, unspecified: Secondary | ICD-10-CM | POA: Diagnosis not present

## 2017-12-06 DIAGNOSIS — I10 Essential (primary) hypertension: Secondary | ICD-10-CM | POA: Diagnosis not present

## 2017-12-06 DIAGNOSIS — K5792 Diverticulitis of intestine, part unspecified, without perforation or abscess without bleeding: Secondary | ICD-10-CM | POA: Diagnosis not present

## 2017-12-06 DIAGNOSIS — Z888 Allergy status to other drugs, medicaments and biological substances status: Secondary | ICD-10-CM | POA: Diagnosis not present

## 2017-12-06 DIAGNOSIS — R197 Diarrhea, unspecified: Secondary | ICD-10-CM | POA: Diagnosis not present

## 2017-12-06 DIAGNOSIS — N329 Bladder disorder, unspecified: Secondary | ICD-10-CM | POA: Diagnosis present

## 2017-12-06 DIAGNOSIS — K869 Disease of pancreas, unspecified: Secondary | ICD-10-CM | POA: Diagnosis not present

## 2017-12-06 DIAGNOSIS — Z8249 Family history of ischemic heart disease and other diseases of the circulatory system: Secondary | ICD-10-CM | POA: Diagnosis not present

## 2017-12-06 DIAGNOSIS — R531 Weakness: Secondary | ICD-10-CM | POA: Diagnosis not present

## 2017-12-06 DIAGNOSIS — I2699 Other pulmonary embolism without acute cor pulmonale: Secondary | ICD-10-CM | POA: Diagnosis present

## 2017-12-06 DIAGNOSIS — Z96653 Presence of artificial knee joint, bilateral: Secondary | ICD-10-CM | POA: Diagnosis present

## 2017-12-06 DIAGNOSIS — Z8673 Personal history of transient ischemic attack (TIA), and cerebral infarction without residual deficits: Secondary | ICD-10-CM | POA: Diagnosis not present

## 2017-12-06 DIAGNOSIS — K5732 Diverticulitis of large intestine without perforation or abscess without bleeding: Secondary | ICD-10-CM | POA: Diagnosis present

## 2017-12-06 DIAGNOSIS — E119 Type 2 diabetes mellitus without complications: Secondary | ICD-10-CM | POA: Diagnosis present

## 2017-12-06 DIAGNOSIS — K219 Gastro-esophageal reflux disease without esophagitis: Secondary | ICD-10-CM | POA: Diagnosis present

## 2017-12-06 DIAGNOSIS — Z87891 Personal history of nicotine dependence: Secondary | ICD-10-CM | POA: Diagnosis not present

## 2017-12-06 DIAGNOSIS — R3121 Asymptomatic microscopic hematuria: Secondary | ICD-10-CM | POA: Diagnosis not present

## 2017-12-06 DIAGNOSIS — I2782 Chronic pulmonary embolism: Secondary | ICD-10-CM | POA: Diagnosis not present

## 2017-12-06 DIAGNOSIS — E785 Hyperlipidemia, unspecified: Secondary | ICD-10-CM | POA: Diagnosis present

## 2017-12-06 DIAGNOSIS — I4891 Unspecified atrial fibrillation: Secondary | ICD-10-CM | POA: Diagnosis present

## 2017-12-06 DIAGNOSIS — Z7984 Long term (current) use of oral hypoglycemic drugs: Secondary | ICD-10-CM | POA: Diagnosis not present

## 2017-12-06 DIAGNOSIS — K5730 Diverticulosis of large intestine without perforation or abscess without bleeding: Secondary | ICD-10-CM | POA: Diagnosis not present

## 2017-12-06 DIAGNOSIS — Z79899 Other long term (current) drug therapy: Secondary | ICD-10-CM | POA: Diagnosis not present

## 2017-12-06 DIAGNOSIS — M1990 Unspecified osteoarthritis, unspecified site: Secondary | ICD-10-CM | POA: Diagnosis present

## 2017-12-06 DIAGNOSIS — K760 Fatty (change of) liver, not elsewhere classified: Secondary | ICD-10-CM | POA: Diagnosis not present

## 2017-12-06 DIAGNOSIS — F329 Major depressive disorder, single episode, unspecified: Secondary | ICD-10-CM | POA: Diagnosis present

## 2017-12-06 DIAGNOSIS — E876 Hypokalemia: Secondary | ICD-10-CM | POA: Diagnosis not present

## 2017-12-06 DIAGNOSIS — R3129 Other microscopic hematuria: Secondary | ICD-10-CM | POA: Diagnosis present

## 2017-12-06 DIAGNOSIS — I359 Nonrheumatic aortic valve disorder, unspecified: Secondary | ICD-10-CM | POA: Diagnosis not present

## 2017-12-06 DIAGNOSIS — Z885 Allergy status to narcotic agent status: Secondary | ICD-10-CM | POA: Diagnosis not present

## 2017-12-06 LAB — ECHOCARDIOGRAM COMPLETE
AO mean calculated velocity dopler: 167 cm/s
AOPV: 0.44 m/s
AOVTI: 45.1 cm
AV Area VTI: 1.39 cm2
AV Mean grad: 13 mmHg
AV Peak grad: 22 mmHg
AV VEL mean LVOT/AV: 0.4
AV pk vel: 232 cm/s
AVAREAMEANV: 1.25 cm2
AVAREAMEANVIN: 0.67 cm2/m2
AVAREAVTIIND: 0.81 cm2/m2
AVPHT: 315 ms
Ao-asc: 35 cm
CHL CUP AV PEAK INDEX: 0.75
CHL CUP AV VALUE AREA INDEX: 0.81
CHL CUP AV VEL: 1.51
CHL CUP DOP CALC LVOT VTI: 21.7 cm
CHL CUP MV DEC (S): 204
E decel time: 204 msec
FS: 23 % — AB (ref 28–44)
HEIGHTINCHES: 63 in
IV/PV OW: 0.77
LA ID, A-P, ES: 46 mm
LA diam end sys: 46 mm
LA diam index: 2.48 cm/m2
LAVOL: 63.4 mL
LAVOLA4C: 55.1 mL
LAVOLIN: 34.2 mL/m2
LV PW d: 13.9 mm — AB (ref 0.6–1.1)
LVOT area: 3.14 cm2
LVOT diameter: 20 mm
LVOTPV: 103 cm/s
LVOTSV: 68 mL
LVOTVTI: 0.48 cm
MV Peak grad: 6 mmHg
MV pk E vel: 127 m/s
MVPKAVEL: 90.7 m/s
RV TAPSE: 16.5 mm
Reg peak vel: 318 cm/s
TR max vel: 318 cm/s
Valve area: 1.51 cm2
WEIGHTICAEL: 2659.63 [oz_av]

## 2017-12-06 LAB — GLUCOSE, CAPILLARY
GLUCOSE-CAPILLARY: 142 mg/dL — AB (ref 65–99)
GLUCOSE-CAPILLARY: 218 mg/dL — AB (ref 65–99)
GLUCOSE-CAPILLARY: 103 mg/dL — AB (ref 65–99)
GLUCOSE-CAPILLARY: 113 mg/dL — AB (ref 65–99)

## 2017-12-06 LAB — URINALYSIS, ROUTINE W REFLEX MICROSCOPIC
Bilirubin Urine: NEGATIVE
Glucose, UA: 50 mg/dL — AB
Ketones, ur: NEGATIVE mg/dL
Leukocytes, UA: NEGATIVE
NITRITE: NEGATIVE
Protein, ur: NEGATIVE mg/dL
SPECIFIC GRAVITY, URINE: 1.028 (ref 1.005–1.030)
pH: 5 (ref 5.0–8.0)

## 2017-12-06 LAB — BASIC METABOLIC PANEL
ANION GAP: 5 (ref 5–15)
Anion gap: 6 (ref 5–15)
BUN: 11 mg/dL (ref 6–20)
BUN: 9 mg/dL (ref 6–20)
CHLORIDE: 108 mmol/L (ref 101–111)
CO2: 29 mmol/L (ref 22–32)
CO2: 26 mmol/L (ref 22–32)
CREATININE: 0.86 mg/dL (ref 0.44–1.00)
Calcium: 7.1 mg/dL — ABNORMAL LOW (ref 8.9–10.3)
Calcium: 7.3 mg/dL — ABNORMAL LOW (ref 8.9–10.3)
Chloride: 105 mmol/L (ref 101–111)
Creatinine, Ser: 0.97 mg/dL (ref 0.44–1.00)
GFR calc Af Amer: >60 mL/min (ref >60)
GFR, EST NON AFRICAN AMERICAN: 53 mL/min — AB (ref >60)
GLUCOSE: 152 mg/dL — AB (ref 65–99)
GLUCOSE: 145 mg/dL — AB (ref 65–99)
POTASSIUM: 2.7 mmol/L — AB (ref 3.5–5.1)
Potassium: 3.3 mmol/L — ABNORMAL LOW (ref 3.5–5.1)
SODIUM: 140 mmol/L (ref 135–145)
Sodium: 139 mmol/L (ref 135–145)

## 2017-12-06 LAB — GASTROINTESTINAL PANEL BY PCR, STOOL (REPLACES STOOL CULTURE)

## 2017-12-06 LAB — MAGNESIUM: Magnesium: 1.5 mg/dL — ABNORMAL LOW (ref 1.7–2.4)

## 2017-12-06 LAB — PHOSPHORUS: PHOSPHORUS: 2.4 mg/dL — AB (ref 2.5–4.6)

## 2017-12-06 MED ORDER — LORAZEPAM 0.5 MG PO TABS
0.5000 mg | ORAL_TABLET | Freq: Once | ORAL | Status: AC
  Administered 2017-12-07: 0.5 mg via ORAL
  Filled 2017-12-06: qty 1

## 2017-12-06 MED ORDER — IOPAMIDOL (ISOVUE-300) INJECTION 61%
INTRAVENOUS | Status: AC
  Administered 2017-12-06: 15 mL
  Filled 2017-12-06: qty 30

## 2017-12-06 MED ORDER — IOPAMIDOL (ISOVUE-300) INJECTION 61%
INTRAVENOUS | Status: AC
  Administered 2017-12-06: 16:00:00
  Filled 2017-12-06: qty 100

## 2017-12-06 MED ORDER — IMIPRAMINE HCL 25 MG PO TABS
25.0000 mg | ORAL_TABLET | Freq: Every day | ORAL | Status: DC
  Administered 2017-12-06 – 2017-12-08 (×3): 25 mg via ORAL
  Filled 2017-12-06 (×4): qty 1

## 2017-12-06 MED ORDER — LEVOFLOXACIN IN D5W 750 MG/150ML IV SOLN
750.0000 mg | INTRAVENOUS | Status: DC
  Administered 2017-12-06 – 2017-12-08 (×2): 750 mg via INTRAVENOUS
  Filled 2017-12-06 (×2): qty 150

## 2017-12-06 MED ORDER — METRONIDAZOLE IN NACL 5-0.79 MG/ML-% IV SOLN
500.0000 mg | Freq: Three times a day (TID) | INTRAVENOUS | Status: DC
  Administered 2017-12-06 – 2017-12-09 (×9): 500 mg via INTRAVENOUS
  Filled 2017-12-06 (×10): qty 100

## 2017-12-06 MED ORDER — LOPERAMIDE HCL 2 MG PO CAPS
2.0000 mg | ORAL_CAPSULE | Freq: Three times a day (TID) | ORAL | Status: DC | PRN
Start: 2017-12-06 — End: 2017-12-08
  Filled 2017-12-06: qty 1

## 2017-12-06 MED ORDER — POTASSIUM CHLORIDE CRYS ER 20 MEQ PO TBCR
40.0000 meq | EXTENDED_RELEASE_TABLET | Freq: Two times a day (BID) | ORAL | Status: DC
  Administered 2017-12-06 – 2017-12-09 (×7): 40 meq via ORAL
  Filled 2017-12-06 (×7): qty 2

## 2017-12-06 MED ORDER — IOPAMIDOL (ISOVUE-300) INJECTION 61%
100.0000 mL | Freq: Once | INTRAVENOUS | Status: AC | PRN
  Administered 2017-12-06: 100 mL via INTRAVENOUS

## 2017-12-06 MED ORDER — POTASSIUM CHLORIDE CRYS ER 10 MEQ PO TBCR
30.0000 meq | EXTENDED_RELEASE_TABLET | ORAL | Status: DC
  Administered 2017-12-06: 07:00:00 30 meq via ORAL
  Filled 2017-12-06: qty 1

## 2017-12-06 MED ORDER — POTASSIUM CHLORIDE IN NACL 40-0.9 MEQ/L-% IV SOLN
INTRAVENOUS | Status: DC
  Administered 2017-12-06 – 2017-12-08 (×5): 75 mL/h via INTRAVENOUS
  Filled 2017-12-06 (×6): qty 1000

## 2017-12-06 MED ORDER — HYDRALAZINE HCL 20 MG/ML IJ SOLN: 10.0000 mg | Freq: Three times a day (TID) | INTRAMUSCULAR | Status: DC | PRN

## 2017-12-06 NOTE — Progress Notes (Signed)
Pharmacy Antibiotic Note  [name] is a 83 y.o. female admitted on 12/05/2017 with intra-abdominal infection, per CT likely acute diverticulitis.  Pharmacy has been consulted for levaquin dosing.  Plan: levaquin 750mg  IV q48h for CrCl ~39mls/min Flagyl 500mg  IV q8h per MD Follow renal function and clinical course  Height: 5\' 3"  (160 cm) Weight: 166 lb 3.6 oz (75.4 kg) IBW/kg (Calculated) : 52.4  Temp (24hrs), Avg:98 F (36.7 C), Min:97.5 F (36.4 C), Max:98.3 F (36.8 C)  Recent Labs  Lab 12/05/17 1155 12/05/17 1711 12/06/17 0438  WBC 10.7*  --   --   CREATININE 1.00 0.99 0.97    Estimated Creatinine Clearance: 44.2 mL/min (by C-G formula based on SCr of 0.97 mg/dL).    Allergies  Allergen Reactions  . Cymbalta [Duloxetine Hcl] Anaphylaxis  . Oxycodone Other (See Comments)    Nightmares  . Ramipril Other (See Comments)    REACTION: difficulty breathing  . Hydrochlorothiazide Rash  . Telmisartan-Hctz Rash    Antimicrobials this admission: 12/27 levaquin >> 12/27 flagyl >>   Thank you for allowing pharmacy to be a part of this patient's care.  Dolly Rias RPh 12/06/2017, 4:26 PM Pager [phone]

## 2017-12-06 NOTE — Progress Notes (Signed)
Nutrition Brief Note  Patient identified on the Malnutrition Screening Tool (MST) Report  Wt Readings from Last 15 Encounters:  12/06/17 166 lb 3.6 oz (75.4 kg)  11/06/17 165 lb 3.2 oz (74.9 kg)  10/26/17 171 lb 4.8 oz (77.7 kg)  10/23/17 169 lb 9.6 oz (76.9 kg)  10/18/17 169 lb (76.7 kg)  10/16/17 169 lb 3.2 oz (76.7 kg)  10/09/17 171 lb (77.6 kg)  01/19/17 188 lb 3.2 oz (85.4 kg)  04/21/16 195 lb (88.5 kg)  05/06/15 187 lb (84.8 kg)  12/20/14 180 lb (81.6 kg)  03/24/14 190 lb (86.2 kg)  11/13/13 193 lb (87.5 kg)  08/05/13 192 lb (87.1 kg)  04/17/13 188 lb (85.3 kg)    Body mass index is 29.45 kg/m. Patient meets criteria for overweight based on current BMI. Pt reports losing 40 lbs in the past 9 months. Per review, pt has lost 22 lbs (12% body weight) in the past 10 months which is not significant for time frame. Pt has lost 3 lbs (2% body weight) in the past 1.5 months which is also not significant for time frame. Skin WDL.   Pt admitted for weakness and diarrhea which has been ongoing x3-4 months with up to 5 episodes occurring each day. D/t this, pt reports severe lethargy/fatigue/increasing weakness. Pt feels that diarrhea could be d/t taking metformin. She has not seen a GI MD concerning chronic diarrhea; GI consulted by MD during this hospitalization. C.diff negative and pending GI panel. MD note states possible DM gastroparesis as cause d/t hx of uncontrolled DM or early satiety vs IBS vs GI pathology. MD note states likely home in 1-2 days.   Current diet order is Heart Healthy and breakfast this AM was 625 kal, 16 grams of protein. Lunch ordered but not yet delivered (459 kcal, 29 grams of protein). Last documented BM was ~23 hours ago.   Medications reviewed; sliding scale Novolog, 2 g IV Mg sulfate x1 run yesterday, 40 mg oral Protonix/day, 1- mEq IV KCl x5 runs yesterday, 40 mEq oral KCl BID, 1000 mcg oral vitamin B12/day.  Labs reviewed; CBG: 142 mg/dL, K: 2.7 mmol/L,  Phos: 2.4 mmol/L, Mg: 1.5 mg/dL, Ca: 7.1 mg/dL. IVF: NS-40 mEq KCl @ 75 mL/hr.   No nutrition interventions warranted at this time. If nutrition issues arise, please consult RD.     Jarome Matin, MS, RD, LDN, [facility] Inpatient Clinical Dietitian Pager # [phone] After hours/weekend pager # [phone]

## 2017-12-06 NOTE — Progress Notes (Signed)
  Echocardiogram 2D Echocardiogram has been performed.  [name] 12/06/2017, 12:43 PM

## 2017-12-06 NOTE — Progress Notes (Signed)
CRITICAL VALUE ALERT  Critical Value:  Potassium 2.7  Date & Time Notied:  12/06/17@0542   Provider Notified: Yes  Orders Received/Actions taken: Yes

## 2017-12-06 NOTE — Progress Notes (Signed)
PROGRESS NOTE    [name]  YBO:[id number] DOB: 29-Jan-1934 DOA: 12/05/2017 PCP: Marletta Lor, MD   Brief Narrative: 83 year old female with history of chronic diarrhea for about 3-4 months which was not fully workup, prior stroke, hypertension, hyperlipidemia, pulmonary embolism on anticoagulation, GERD, type 2 diabetes, prior admission for hypokalemia likely in the setting of diarrhea presented with generalized weakness and persistent diarrhea.  Patient and her daughter reported that they follow with PCP but has not seen GI.  Assessment & Plan:  #Chronic diarrhea: Differential diagnosis including diabetic gastroparesis given history of uncontrolled diabetes, early satiety versus irritable bowel syndrome versus GI pathology.  Less likely infectious etiology given no history of fever, chill.  She does have some intermittent abdominal cramping. -Stool C. difficile negative, follow-up GI pathogen. -Patient has some tenderness around left lower quadrant therefore I am ordering CT scan of abdomen pelvis for further evaluation. -Lipase not elevated -Patient will likely benefit from GI evaluation as an outpatient including endoscopy and colonoscopy.  I have reviewed this with the patient and her daughter at bedside.  For now follow-up test result including CT scan.  Meantime I will start Imodium as needed.  # Hypokalemia/hypomagnesemia due to GI loss: Change IV fluid with potassium chloride and starting potassium chloride 40 mEq twice a day.  Repeat lab today.  Check magnesium level.  #Essential hypertension: Continue Norvasc.  Monitor blood pressure.  #History of pulmonary management: Continue Eliquis.  #Generalized weakness in the setting of electrolyte imbalance: PT OT evaluation and supportive care.  #New-onset A. Fib the setting of electrolyte imbalance: Check echocardiogram.  Currently in sinus rhythm and rate is controlled.  TSH acceptable.  Already on anticoagulation.  DVT  prophylaxis:Eliquis Code Status: Full code Family Communication: Discussed with the patient's daughter at bedside Disposition Plan: Likely discharge home in 1-2 days    Consultants:   None  Procedures: None Antimicrobials: None  Subjective: Seen and examined at bedside.  Reported loose bowel movement.  Occasional abdominal cramping.  Mild nausea but no vomiting.  Denies chest pain or shortness of breath.  Objective: Vitals:   12/05/17 1656 12/05/17 2041 12/06/17 0600 12/06/17 0615  BP: 114/78 124/79  (!) 156/91  Pulse:  87  100  Resp:  18  17  Temp:  98.1 F (36.7 C)  98.3 F (36.8 C)  TempSrc:  Oral  Oral  SpO2:  97%  99%  Weight:   75.4 kg (166 lb 3.6 oz)   Height:        Intake/Output Summary (Last 24 hours) at 12/06/2017 1104 Last data filed at 12/06/2017 2778 Gross per 24 hour  Intake 2408.75 ml  Output -  Net 2408.75 ml   Filed Weights   12/05/17 1605 12/06/17 0600  Weight: 75 kg (165 lb 5.5 oz) 75.4 kg (166 lb 3.6 oz)    Examination:  General exam: Appears calm and comfortable  Respiratory system: Clear to auscultation. Respiratory effort normal. No wheezing or crackle Cardiovascular system: S1 & S2 heard, RRR.  No pedal edema. Gastrointestinal system: Abdomen is nondistended, soft and mild left lower quadrant tenderness. Normal bowel sounds heard. Central nervous system: Alert and oriented. No focal neurological deficits. Extremities: Symmetric 5 x 5 power. Skin: No rashes, lesions or ulcers Psychiatry: Judgement and insight appear normal. Mood & affect appropriate.     Data Reviewed: I have personally reviewed following labs and imaging studies  CBC: Recent Labs  Lab 12/05/17 1155  WBC 10.7*  HGB 10.9*  HCT  36.9  MCV 90.2  PLT 517*   Basic Metabolic Panel: Recent Labs  Lab 12/05/17 1155 12/05/17 1711 12/06/17 0438  NA 136 138 139  K 2.2* 2.6* 2.7*  CL 100* 104 105  CO2 25 27 29   GLUCOSE 259* 190* 152*  BUN 12 13 11   CREATININE  1.00 0.99 0.97  CALCIUM 7.3* 7.0* 7.1*  MG 1.1* 1.8  --   PHOS  --   --  2.4*   GFR: Estimated Creatinine Clearance: 44.2 mL/min (by C-G formula based on SCr of 0.97 mg/dL). Liver Function Tests: Recent Labs  Lab 12/05/17 1155  AST 26  ALT 11*  ALKPHOS 68  BILITOT 0.8  PROT 5.2*  ALBUMIN 2.6*   Recent Labs  Lab 12/05/17 1155  LIPASE 17   No results for input(s): AMMONIA in the last 168 hours. Coagulation Profile: No results for input(s): INR, PROTIME in the last 168 hours. Cardiac Enzymes: No results for input(s): CKTOTAL, CKMB, CKMBINDEX, TROPONINI in the last 168 hours. BNP (last 3 results) No results for input(s): PROBNP in the last 8760 hours. HbA1C: No results for input(s): HGBA1C in the last 72 hours. CBG: Recent Labs  Lab 12/05/17 1649 12/05/17 2039 12/06/17 0756  GLUCAP 168* 215* 142*   Lipid Profile: No results for input(s): CHOL, HDL, LDLCALC, TRIG, CHOLHDL, LDLDIRECT in the last 72 hours. Thyroid Function Tests: Recent Labs    12/05/17 1711  TSH 0.935   Anemia Panel: No results for input(s): VITAMINB12, FOLATE, FERRITIN, TIBC, IRON, RETICCTPCT in the last 72 hours. Sepsis Labs: No results for input(s): PROCALCITON, LATICACIDVEN in the last 168 hours.  Recent Results (from the past 240 hour(s))  C difficile quick scan w PCR reflex     Status: None   Collection Time: 12/05/17 12:30 PM  Result Value Ref Range Status   C Diff antigen NEGATIVE NEGATIVE Final    Comment: NEGATIVE NEGATIVE    C Diff toxin NEGATIVE NEGATIVE Final   C Diff interpretation No C. difficile detected.  Final    Comment: NEGATIVE         Radiology Studies: No results found.      Scheduled Meds: . amLODipine  2.5 mg Oral Daily  . apixaban  5 mg Oral BID  . fludrocortisone  0.1 mg Oral Daily  . insulin aspart  0-9 Units Subcutaneous TID WC  . pantoprazole  40 mg Oral Daily  . potassium chloride  40 mEq Oral BID  . cyanocobalamin  1,000 mcg Oral Daily    Continuous Infusions: . 0.9 % NaCl with KCl 40 mEq / L 75 mL/hr (12/06/17 0847)     LOS: 0 days    [name], MD [facility] Pager [phone]  If 7PM-7AM, please contact night-coverage [email or web address] Password [email or web address] 12/06/2017, 11:04 AM

## 2017-12-06 NOTE — Progress Notes (Addendum)
The CT abdomen pelvis result reviewed with the patient's daughter.  Likely acute diverticulitis: starting flagyl and levaquin Clear liquid diet  Right sided bladder mass: consulted urologist and discussed with Dr. Alyson Ingles.  Ordered MRCP w/wo contrast to evaluate the pancreatic pathology.

## 2017-12-07 ENCOUNTER — Inpatient Hospital Stay (HOSPITAL_COMMUNITY): Payer: Medicare Other

## 2017-12-07 DIAGNOSIS — K869 Disease of pancreas, unspecified: Secondary | ICD-10-CM

## 2017-12-07 LAB — RENAL FUNCTION PANEL
ANION GAP: 5 (ref 5–15)
Albumin: 2.2 g/dL — ABNORMAL LOW (ref 3.5–5.0)
BUN: 8 mg/dL (ref 6–20)
CALCIUM: 7.3 mg/dL — AB (ref 8.9–10.3)
CHLORIDE: 109 mmol/L (ref 101–111)
CO2: 26 mmol/L (ref 22–32)
Creatinine, Ser: 0.84 mg/dL (ref 0.44–1.00)
GFR calc non Af Amer: >60 mL/min (ref >60)
GLUCOSE: 141 mg/dL — AB (ref 65–99)
Phosphorus: 1.7 mg/dL — ABNORMAL LOW (ref 2.5–4.6)
Potassium: 3.5 mmol/L (ref 3.5–5.1)
SODIUM: 140 mmol/L (ref 135–145)

## 2017-12-07 LAB — GLUCOSE, CAPILLARY
GLUCOSE-CAPILLARY: 158 mg/dL — AB (ref 65–99)
GLUCOSE-CAPILLARY: 69 mg/dL (ref 65–99)
GLUCOSE-CAPILLARY: 121 mg/dL — AB (ref 65–99)
Glucose-Capillary: 207 mg/dL — ABNORMAL HIGH (ref 65–99)

## 2017-12-07 LAB — MAGNESIUM: Magnesium: 1.2 mg/dL — ABNORMAL LOW (ref 1.7–2.4)

## 2017-12-07 MED ORDER — GADOBENATE DIMEGLUMINE 529 MG/ML IV SOLN
15.0000 mL | Freq: Once | INTRAVENOUS | Status: AC | PRN
  Administered 2017-12-07: 16 mL via INTRAVENOUS

## 2017-12-07 NOTE — Progress Notes (Addendum)
PT Cancellation/Sign off Note  Patient Details Name: [name] MRN: [id number] DOB: 05-27-1934   Cancelled Treatment:    Reason Eval/Treat Not Completed: PT screened, no needs identified, will sign off. Spoke with pt-she politely declines PT services- " I won't be needing that." Will sign off at pt's request.    Weston Anna, MPT Pager: [phone]

## 2017-12-07 NOTE — Evaluation (Signed)
Occupational Therapy Evaluation Patient Details Name: [name] MRN: [id number] DOB: 03/28/1934 Today's Date: 12/07/2017    History of Present Illness 83 year old female with history of chronic diarrhea for about 3-4 months which was not fully workup, prior stroke, hypertension, hyperlipidemia, pulmonary embolism on anticoagulation, GERD, type 2 diabetes, prior admission for hypokalemia likely in the setting of diarrhea presented with generalized weakness and persistent diarrhea. per CT likely acute diverticulitis.   Clinical Impression   This 83 y/o F presents with the above. At baseline Pt is mod independent with ADLs and functional mobility using rollator, though receives assist for tub transfers. Pt completed functional mobility within room at [place] level with MinA, requires Lyman for LB ADLs, presenting with generalized weakness and decreased activity tolerance. Pt would benefit from additional acute OT services to maximize Pt's safety and independence with ADLs and mobility prior to return home.  Of note, Pt spoke with PT this afternoon,  Pt does not wish to participate in additional acute OT/PT services during this admission though this therapist feels she would benefit. OT order is appreciated. Will sign off at this time.      Follow Up Recommendations  Supervision/Assistance - 24 hour    Equipment Recommendations  3 in 1 bedside commode           Precautions / Restrictions Precautions Precautions: Fall Restrictions Weight Bearing Restrictions: No      Mobility Bed Mobility Overal bed mobility: Needs Assistance Bed Mobility: Supine to Sit     Supine to sit: Mod assist;HOB elevated     General bed mobility comments: HHA to bring trunk upright and to fully scoot hips towards EOB  Transfers Overall transfer level: Needs assistance Equipment used: Rolling walker (2 wheeled) Transfers: Sit to/from [facility] Sit to Stand: Mod assist Stand pivot  transfers: Min assist       General transfer comment: assist to boost into standing, verbal cues for safe hand placement, stood from EOB x1 and recliner x2; MinA to complete stand pivot from EOB to recliner using RW     Balance Overall balance assessment: Needs assistance Sitting-balance support: Feet supported Sitting balance-Leahy Scale: Fair     Standing balance support: Bilateral upper extremity supported;During functional activity Standing balance-Leahy Scale: Poor                             ADL either performed or assessed with clinical judgement   ADL Overall ADL's : Needs assistance/impaired Eating/Feeding: Set up;Sitting   Grooming: Set up;Sitting   Upper Body Bathing: Sitting;Min guard   Lower Body Bathing: Moderate assistance;Sit to/from stand Lower Body Bathing Details (indicate cue type and reason): assist to support RLE in figure 4 position for washing; assist to wash buttocks and perineal area in standing  Upper Body Dressing : Minimal assistance;Sitting Upper Body Dressing Details (indicate cue type and reason): doffing/donning new gown  Lower Body Dressing: Moderate assistance;Sit to/from stand Lower Body Dressing Details (indicate cue type and reason): assist to support RLE in figure 4 position for donning sock; able to maintain figure 4 with LLE without difficulty; assist to thread RLE into mesh underwear and steadying assist provided in standing to advance over hips; educated on compensatory techniques for completing task  Toilet Transfer: Minimal assistance;Stand-pivot;BSC;RW Toilet Transfer Details (indicate cue type and reason): simulated in bed to chair  Toileting- Clothing Manipulation and Hygiene: Sit to/from stand;Moderate assistance       Functional mobility  during ADLs: Minimal assistance;Rolling walker General ADL Comments: Pt transferred to recliner for bathing, dressing, and grooming ADLs, completing from sit<>stand level                           Pertinent Vitals/Pain Pain Assessment: No/denies pain          Extremity/Trunk Assessment Upper Extremity Assessment Upper Extremity Assessment: Generalized weakness   Lower Extremity Assessment Lower Extremity Assessment: Generalized weakness       Communication Communication Communication: HOH   Cognition Arousal/Alertness: Awake/alert Behavior During Therapy: WFL for tasks assessed/performed Overall Cognitive Status: Within Functional Limits for tasks assessed                                                      Home Living Family/patient expects to be discharged to:: Private residence Living Arrangements: Children Available Help at Discharge: Family Type of Home: House Home Access: Stairs to enter Technical brewer of Steps: 3-4   Home Layout: One level     Bathroom Shower/Tub: Tub/shower unit         Home Equipment: Environmental consultant - 4 wheels;Shower seat          Prior Functioning/Environment Level of Independence: Independent with assistive device(s);Needs assistance        Comments: uses rollator; receives assist for stepping into/out of tub         OT Problem List: Decreased strength;Impaired balance (sitting and/or standing);Decreased range of motion;Decreased activity tolerance      OT Treatment/Interventions: Self-care/ADL training;DME and/or AE instruction;Therapeutic activities;Balance training;Therapeutic exercise;Energy conservation;Patient/family education    OT Goals(Current goals can be found in the care plan section) Acute Rehab OT Goals Patient Stated Goal: return home  OT Goal Formulation: With patient  OT Frequency: Min 2X/week                             AM-PAC PT "6 Clicks" Daily Activity     Outcome Measure Help from another person eating meals?: None Help from another person taking care of personal grooming?: A Little Help from another person toileting, which includes  using toliet, bedpan, or urinal?: A Lot Help from another person bathing (including washing, rinsing, drying)?: A Little Help from another person to put on and taking off regular upper body clothing?: None Help from another person to put on and taking off regular lower body clothing?: A Little 6 Click Score: 19   End of Session Equipment Utilized During Treatment: Gait belt;Rolling walker Nurse Communication: Mobility status  Activity Tolerance: Patient tolerated treatment well Patient left: in chair;with call bell/phone within reach;with chair alarm set;with family/visitor present  OT Visit Diagnosis: Unsteadiness on feet (R26.81);Muscle weakness (generalized) (M62.81)                Time: 5409-8119 OT Time Calculation (min): 49 min Charges:  OT General Charges $OT Visit: 1 Visit OT Evaluation $OT Eval Moderate Complexity: 1 Mod OT Treatments $Self Care/Home Management : 23-37 mins G-Codes:     Lou Cal, OT Pager [phone] 12/07/2017   Raymondo Band 12/07/2017, 3:45 PM

## 2017-12-07 NOTE — Consult Note (Signed)
Urology Consult  Referring physician: Dr. Darrick Meigs Reason for referral: bladder mass  Chief Complaint: abdominal pain  History of Present Illness: [name] is a 83yo with a hx of DMII, HTN, HLD, CVA admitted with diverticulitis. She underwent CT which showed a 1cm right posterior bladder mass with calcifications. She denies any lower urinary tract symptoms. No hx of gross hematuria. UA from 10/2017 showed 6-30 RBCs/hpf. No hx of nephrolithiasis. No other associated symptoms. No exacerbating/allevaiting events  Past Medical History:  Diagnosis Date  . Aortic stenosis   . CVA (cerebral infarction)   . Depression   . Diabetes mellitus   . DVT (deep venous thrombosis) ([place])   . GERD (gastroesophageal reflux disease)   . Hyperlipidemia   . Hypertension   . Osteoarthritis   . Pulmonary embolism [facility])    Past Surgical History:  Procedure Laterality Date  . ABDOMINAL HYSTERECTOMY     fibroids, no cancer  . FRACTURE SURGERY     left leg  . JOINT REPLACEMENT     left and right knee  . ROTATOR CUFF REPAIR    . TEMPOROMANDIBULAR JOINT SURGERY      Medications: I have reviewed the patient's current medications. Allergies:  Allergies  Allergen Reactions  . Cymbalta [Duloxetine Hcl] Anaphylaxis  . Oxycodone Other (See Comments)    Nightmares  . Ramipril Other (See Comments)    REACTION: difficulty breathing  . Hydrochlorothiazide Rash  . Telmisartan-Hctz Rash    Family History  Problem Relation Age of Onset  . Dementia Mother   . Hypertension Mother   . Heart attack Father   . Heart failure Sister    Social History:  reports that she quit smoking about 36 years ago. she has never used smokeless tobacco. She reports that she does not drink alcohol or use drugs.  Review of Systems  Constitutional: Positive for chills.  Gastrointestinal: Positive for abdominal pain, diarrhea and nausea.  All other systems reviewed and are negative.   Physical Exam:  Vital signs in last 24  hours: Temp:  [97.5 F (36.4 C)-98.4 F (36.9 C)] 98.4 F (36.9 C) (12/28 0540) Pulse Rate:  [97-103] 97 (12/28 0540) Resp:  [18] 18 (12/28 0540) BP: (148-171)/(93-97) 148/93 (12/28 0540) SpO2:  [96 %-100 %] 97 % (12/28 0540) Weight:  [77.9 kg (171 lb 11.8 oz)] 77.9 kg (171 lb 11.8 oz) (12/28 0540) Physical Exam  Constitutional: She is oriented to person, place, and time. She appears well-developed and well-nourished.  HENT:  Head: Normocephalic and atraumatic.  Eyes: EOM are normal. Pupils are equal, round, and reactive to light.  Neck: Normal range of motion. No thyromegaly present.  Cardiovascular: Normal rate and regular rhythm.  Respiratory: Effort normal. No respiratory distress.  GI: Soft. She exhibits no distension.  Musculoskeletal: Normal range of motion. She exhibits no edema.  Neurological: She is alert and oriented to person, place, and time.  Skin: Skin is warm and dry.  Psychiatric: She has a normal mood and affect. Her behavior is normal. Judgment and thought content normal.    Laboratory Data:  Results for orders placed or performed during the hospital encounter of 12/05/17 (from the past 72 hour(s))  Lipase, blood     Status: None   Collection Time: 12/05/17 11:55 AM  Result Value Ref Range   Lipase 17 11 - 51 U/L  Comprehensive metabolic panel     Status: Abnormal   Collection Time: 12/05/17 11:55 AM  Result Value Ref Range   Sodium  136 135 - 145 mmol/L   Potassium 2.2 (LL) 3.5 - 5.1 mmol/L    Comment: CRITICAL RESULT CALLED TO, READ BACK BY AND VERIFIED WITH: S WEST,RN 12/05/17 1244 RHOLMES    Chloride 100 (L) 101 - 111 mmol/L   CO2 25 22 - 32 mmol/L   Glucose, Bld 259 (H) 65 - 99 mg/dL   BUN 12 6 - 20 mg/dL   Creatinine, Ser 1.00 0.44 - 1.00 mg/dL   Calcium 7.3 (L) 8.9 - 10.3 mg/dL   Total Protein 5.2 (L) 6.5 - 8.1 g/dL   Albumin 2.6 (L) 3.5 - 5.0 g/dL   AST 26 15 - 41 U/L   ALT 11 (L) 14 - 54 U/L   Alkaline Phosphatase 68 38 - 126 U/L   Total  Bilirubin 0.8 0.3 - 1.2 mg/dL   GFR calc non Af Amer 51 (L) >60 mL/min   GFR calc Af Amer 60 (L) >60 mL/min    Comment: (NOTE) The eGFR has been calculated using the CKD EPI equation. This calculation has not been validated in all clinical situations. eGFR's persistently <60 mL/min signify possible Chronic Kidney Disease.    Anion gap 11 5 - 15  CBC     Status: Abnormal   Collection Time: 12/05/17 11:55 AM  Result Value Ref Range   WBC 10.7 (H) 4.0 - 10.5 K/uL   RBC 4.09 3.87 - 5.11 MIL/uL   Hemoglobin 10.9 (L) 12.0 - 15.0 g/dL   HCT 36.9 36.0 - 46.0 %   MCV 90.2 78.0 - 100.0 fL   MCH 26.7 26.0 - 34.0 pg   MCHC 29.5 (L) 30.0 - 36.0 g/dL   RDW 13.6 11.5 - 15.5 %   Platelets 142 (L) 150 - 400 K/uL  Magnesium     Status: Abnormal   Collection Time: 12/05/17 11:55 AM  Result Value Ref Range   Magnesium 1.1 (L) 1.7 - 2.4 mg/dL  C difficile quick scan w PCR reflex     Status: None   Collection Time: 12/05/17 12:30 PM  Result Value Ref Range   C Diff antigen NEGATIVE NEGATIVE    Comment: NEGATIVE NEGATIVE    C Diff toxin NEGATIVE NEGATIVE   C Diff interpretation No C. difficile detected.     Comment: NEGATIVE  Gastrointestinal Panel by PCR , Stool     Status: None   Collection Time: 12/05/17 12:30 PM  Result Value Ref Range   Campylobacter species NOT DETECTED NOT DETECTED   Plesimonas shigelloides NOT DETECTED NOT DETECTED   Salmonella species NOT DETECTED NOT DETECTED   Yersinia enterocolitica NOT DETECTED NOT DETECTED   Vibrio species NOT DETECTED NOT DETECTED   Vibrio cholerae NOT DETECTED NOT DETECTED   Enteroaggregative E coli (EAEC) NOT DETECTED NOT DETECTED   Enteropathogenic E coli (EPEC) NOT DETECTED NOT DETECTED   Enterotoxigenic E coli (ETEC) NOT DETECTED NOT DETECTED   Shiga like toxin producing E coli (STEC) NOT DETECTED NOT DETECTED   Shigella/Enteroinvasive E coli (EIEC) NOT DETECTED NOT DETECTED   Cryptosporidium NOT DETECTED NOT DETECTED   Cyclospora  cayetanensis NOT DETECTED NOT DETECTED   Entamoeba histolytica NOT DETECTED NOT DETECTED   Giardia lamblia NOT DETECTED NOT DETECTED   Adenovirus F40/41 NOT DETECTED NOT DETECTED   Astrovirus NOT DETECTED NOT DETECTED   Norovirus GI/GII NOT DETECTED NOT DETECTED   Rotavirus A NOT DETECTED NOT DETECTED   Sapovirus (I, II, IV, and V) NOT DETECTED NOT DETECTED    Comment: Performed at  [facility], [address] [place], [place] [address]  Glucose, capillary     Status: Abnormal   Collection Time: 12/05/17  4:49 PM  Result Value Ref Range   Glucose-Capillary 168 (H) 65 - 99 mg/dL  Magnesium     Status: None   Collection Time: 12/05/17  5:11 PM  Result Value Ref Range   Magnesium 1.8 1.7 - 2.4 mg/dL  Basic metabolic panel     Status: Abnormal   Collection Time: 12/05/17  5:11 PM  Result Value Ref Range   Sodium 138 135 - 145 mmol/L   Potassium 2.6 (LL) 3.5 - 5.1 mmol/L    Comment: CRITICAL RESULT CALLED TO, READ BACK BY AND VERIFIED WITH: ERICA PELLETIER,RN [id number] @ 1809 BY J SCOTTON    Chloride 104 101 - 111 mmol/L   CO2 27 22 - 32 mmol/L   Glucose, Bld 190 (H) 65 - 99 mg/dL   BUN 13 6 - 20 mg/dL   Creatinine, Ser 0.99 0.44 - 1.00 mg/dL   Calcium 7.0 (L) 8.9 - 10.3 mg/dL   GFR calc non Af Amer 52 (L) >60 mL/min   GFR calc Af Amer >60 >60 mL/min    Comment: (NOTE) The eGFR has been calculated using the CKD EPI equation. This calculation has not been validated in all clinical situations. eGFR's persistently <60 mL/min signify possible Chronic Kidney Disease.    Anion gap 7 5 - 15  TSH     Status: None   Collection Time: 12/05/17  5:11 PM  Result Value Ref Range   TSH 0.935 0.350 - 4.500 uIU/mL    Comment: Performed by a 3rd Generation assay with a functional sensitivity of <=0.01 uIU/mL.  Glucose, capillary     Status: Abnormal   Collection Time: 12/05/17  8:39 PM  Result Value Ref Range   Glucose-Capillary 215 (H) 65 - 99 mg/dL  Basic metabolic panel      Status: Abnormal   Collection Time: 12/06/17  4:38 AM  Result Value Ref Range   Sodium 139 135 - 145 mmol/L   Potassium 2.7 (LL) 3.5 - 5.1 mmol/L    Comment: CRITICAL RESULT CALLED TO, READ BACK BY AND VERIFIED WITH: NJANG,H RN 12.27.18 @0543  ZANDO,C    Chloride 105 101 - 111 mmol/L   CO2 29 22 - 32 mmol/L   Glucose, Bld 152 (H) 65 - 99 mg/dL   BUN 11 6 - 20 mg/dL   Creatinine, Ser 0.97 0.44 - 1.00 mg/dL   Calcium 7.1 (L) 8.9 - 10.3 mg/dL   GFR calc non Af Amer 53 (L) >60 mL/min   GFR calc Af Amer >60 >60 mL/min    Comment: (NOTE) The eGFR has been calculated using the CKD EPI equation. This calculation has not been validated in all clinical situations. eGFR's persistently <60 mL/min signify possible Chronic Kidney Disease. CORRECTED ON 12/27 AT 0537: PREVIOUSLY REPORTED AS >60    Anion gap 5 5 - 15  Phosphorus     Status: Abnormal   Collection Time: 12/06/17  4:38 AM  Result Value Ref Range   Phosphorus 2.4 (L) 2.5 - 4.6 mg/dL  Magnesium     Status: Abnormal   Collection Time: 12/06/17  4:38 AM  Result Value Ref Range   Magnesium 1.5 (L) 1.7 - 2.4 mg/dL  Glucose, capillary     Status: Abnormal   Collection Time: 12/06/17  7:56 AM  Result Value Ref Range   Glucose-Capillary 142 (H) 65 - 99  mg/dL  Glucose, capillary     Status: Abnormal   Collection Time: 12/06/17 11:59 AM  Result Value Ref Range   Glucose-Capillary 218 (H) 65 - 99 mg/dL  Glucose, capillary     Status: Abnormal   Collection Time: 12/06/17  5:04 PM  Result Value Ref Range   Glucose-Capillary 103 (H) 65 - 99 mg/dL  Basic metabolic panel     Status: Abnormal   Collection Time: 12/06/17  7:09 PM  Result Value Ref Range   Sodium 140 135 - 145 mmol/L   Potassium 3.3 (L) 3.5 - 5.1 mmol/L    Comment: DELTA CHECK NOTED REPEATED TO VERIFY NO VISIBLE HEMOLYSIS    Chloride 108 101 - 111 mmol/L   CO2 26 22 - 32 mmol/L   Glucose, Bld 145 (H) 65 - 99 mg/dL   BUN 9 6 - 20 mg/dL   Creatinine, Ser 0.86 0.44 -  1.00 mg/dL   Calcium 7.3 (L) 8.9 - 10.3 mg/dL   GFR calc non Af Amer >60 >60 mL/min   GFR calc Af Amer >60 >60 mL/min    Comment: (NOTE) The eGFR has been calculated using the CKD EPI equation. This calculation has not been validated in all clinical situations. eGFR's persistently <60 mL/min signify possible Chronic Kidney Disease.    Anion gap 6 5 - 15  Glucose, capillary     Status: Abnormal   Collection Time: 12/06/17  9:46 PM  Result Value Ref Range   Glucose-Capillary 113 (H) 65 - 99 mg/dL  Urinalysis, Routine w reflex microscopic     Status: Abnormal   Collection Time: 12/06/17 10:24 PM  Result Value Ref Range   Color, Urine STRAW (A) YELLOW   APPearance CLEAR CLEAR   Specific Gravity, Urine 1.028 1.005 - 1.030   pH 5.0 5.0 - 8.0   Glucose, UA 50 (A) NEGATIVE mg/dL   Hgb urine dipstick MODERATE (A) NEGATIVE   Bilirubin Urine NEGATIVE NEGATIVE   Ketones, ur NEGATIVE NEGATIVE mg/dL   Protein, ur NEGATIVE NEGATIVE mg/dL   Nitrite NEGATIVE NEGATIVE   Leukocytes, UA NEGATIVE NEGATIVE   RBC / HPF 6-30 0 - 5 RBC/hpf   WBC, UA 0-5 0 - 5 WBC/hpf   Bacteria, UA RARE (A) NONE SEEN   Squamous Epithelial / LPF 0-5 (A) NONE SEEN   Mucus PRESENT   Renal function panel     Status: Abnormal   Collection Time: 12/07/17  4:38 AM  Result Value Ref Range   Sodium 140 135 - 145 mmol/L   Potassium 3.5 3.5 - 5.1 mmol/L   Chloride 109 101 - 111 mmol/L   CO2 26 22 - 32 mmol/L   Glucose, Bld 141 (H) 65 - 99 mg/dL   BUN 8 6 - 20 mg/dL   Creatinine, Ser 0.84 0.44 - 1.00 mg/dL   Calcium 7.3 (L) 8.9 - 10.3 mg/dL   Phosphorus 1.7 (L) 2.5 - 4.6 mg/dL   Albumin 2.2 (L) 3.5 - 5.0 g/dL   GFR calc non Af Amer >60 >60 mL/min   GFR calc Af Amer >60 >60 mL/min    Comment: (NOTE) The eGFR has been calculated using the CKD EPI equation. This calculation has not been validated in all clinical situations. eGFR's persistently <60 mL/min signify possible Chronic Kidney Disease.    Anion gap 5 5 - 15   Magnesium     Status: Abnormal   Collection Time: 12/07/17  4:38 AM  Result Value Ref Range   Magnesium 1.2 (  L) 1.7 - 2.4 mg/dL  Glucose, capillary     Status: Abnormal   Collection Time: 12/07/17  7:23 AM  Result Value Ref Range   Glucose-Capillary 158 (H) 65 - 99 mg/dL   Recent Results (from the past 240 hour(s))  C difficile quick scan w PCR reflex     Status: None   Collection Time: 12/05/17 12:30 PM  Result Value Ref Range Status   C Diff antigen NEGATIVE NEGATIVE Final    Comment: NEGATIVE NEGATIVE    C Diff toxin NEGATIVE NEGATIVE Final   C Diff interpretation No C. difficile detected.  Final    Comment: NEGATIVE  Gastrointestinal Panel by PCR , Stool     Status: None   Collection Time: 12/05/17 12:30 PM  Result Value Ref Range Status   Campylobacter species NOT DETECTED NOT DETECTED Final   Plesimonas shigelloides NOT DETECTED NOT DETECTED Final   Salmonella species NOT DETECTED NOT DETECTED Final   Yersinia enterocolitica NOT DETECTED NOT DETECTED Final   Vibrio species NOT DETECTED NOT DETECTED Final   Vibrio cholerae NOT DETECTED NOT DETECTED Final   Enteroaggregative E coli (EAEC) NOT DETECTED NOT DETECTED Final   Enteropathogenic E coli (EPEC) NOT DETECTED NOT DETECTED Final   Enterotoxigenic E coli (ETEC) NOT DETECTED NOT DETECTED Final   Shiga like toxin producing E coli (STEC) NOT DETECTED NOT DETECTED Final   Shigella/Enteroinvasive E coli (EIEC) NOT DETECTED NOT DETECTED Final   Cryptosporidium NOT DETECTED NOT DETECTED Final   Cyclospora cayetanensis NOT DETECTED NOT DETECTED Final   Entamoeba histolytica NOT DETECTED NOT DETECTED Final   Giardia lamblia NOT DETECTED NOT DETECTED Final   Adenovirus F40/41 NOT DETECTED NOT DETECTED Final   Astrovirus NOT DETECTED NOT DETECTED Final   Norovirus GI/GII NOT DETECTED NOT DETECTED Final   Rotavirus A NOT DETECTED NOT DETECTED Final   Sapovirus (I, II, IV, and V) NOT DETECTED NOT DETECTED Final    Comment:  Performed at [facility], [address] [place], [place] [address]   Creatinine: Recent Labs    12/05/17 1155 12/05/17 1711 12/06/17 0438 12/06/17 1909 12/07/17 0438  CREATININE 1.00 0.99 0.97 0.86 0.84   Baseline Creatinine: 1  Impression/Assessment:  83yo with microhematuria and bladder lesion  Plan:  1. I discussed the workup for microhematuria and her bladder lesion. I discussed the various etiologies tot he hematuria including bladder malignancy and urinary tract calculi. We will schedule the patient for cystoscopy in the office 2 weeks after her discharge from the hospital.   Nicolette Bang 12/07/2017, 8:03 AM

## 2017-12-07 NOTE — Progress Notes (Addendum)
[facility]  PROGRESS NOTE  Sheritha Louis VFI:[id number] DOB: [birth year] DOA: 12/05/2017 PCP: Marletta Lor, MD   Brief HPI:    83 year old female with history of chronic diarrhea for about 3-4 months which was not fully workup, prior stroke, hypertension, hyperlipidemia, pulmonary embolism on anticoagulation, GERD, type 2 diabetes, prior admission for hypokalemia likely in the setting of diarrhea presented with generalized weakness and persistent diarrhea.  Patient and her daughter reported that they follow with PCP but has not seen GI.     Subjective   Patient seen and examined, still has close bowel movements. Had 3 episodes of diarrhea or past 24 hours.   Assessment/Plan:     1. Chronic diarrhea- CT abdomen pelvis showed distal colonic diverticulosis with suspected acute diverticulitis of the descending sigmoid junction. Patient is currently on Levaquin and Flagyl. 2. Pancreatic mass- MRI abdomen showed concern for pancreatic head mass worrisome for adenocarcinoma. Will obtain Ca 19-9, AFP. Will consult GI for further evaluation. 3. History of pulmonary embolism-continue eliquis 4. Essential hypertension-continue Norvasc 5. New onset A. Fib- in setting electrolyte abnormalities. Echocardiogram showed moderate concentric hypertrophy EF 55%. Patient is currently on eliquis for anti-coagulation.    DVT prophylaxis: Eliquis  Code Status: Full code  Family Communication: Discussed with patient's daughter at bedside   Disposition Plan: To be decided after GI evaluation   Consultants:  None   Procedures:  None   Continuous infusions . 0.9 % NaCl with KCl 40 mEq / L 75 mL/hr (12/07/17 1324)  . levofloxacin (LEVAQUIN) IV Stopped (12/06/17 1845)  . metronidazole 500 mg (12/07/17 1719)      Antibiotics:   Anti-infectives (From admission, onward)   Start     Dose/Rate Route Frequency Ordered Stop   12/06/17 1700  metroNIDAZOLE (FLAGYL) IVPB 500 mg      500 mg 100 mL/hr over 60 Minutes Intravenous Every 8 hours 12/06/17 1606     12/06/17 1700  levofloxacin (LEVAQUIN) IVPB 750 mg     750 mg 100 mL/hr over 90 Minutes Intravenous Every 48 hours 12/06/17 1623         Objective   Vitals:   12/06/17 1526 12/06/17 2135 12/07/17 0540 12/07/17 1318  BP: (!) 171/95 (!) 155/97 (!) 148/93 (!) 157/82  Pulse: (!) 103 97 97 (!) 109  Resp: 18 18 18 16   Temp: (!) 97.5 F (36.4 C) 98.4 F (36.9 C) 98.4 F (36.9 C) 98.2 F (36.8 C)  TempSrc: Oral Oral Oral Oral  SpO2: 100% 96% 97% 99%  Weight:   77.9 kg (171 lb 11.8 oz)   Height:        Intake/Output Summary (Last 24 hours) at 12/07/2017 1844 Last data filed at 12/07/2017 1757 Gross per 24 hour  Intake 2910 ml  Output 1000 ml  Net 1910 ml   Filed Weights   12/05/17 1605 12/06/17 0600 12/07/17 0540  Weight: 75 kg (165 lb 5.5 oz) 75.4 kg (166 lb 3.6 oz) 77.9 kg (171 lb 11.8 oz)     Physical Examination:   Physical Exam: Eyes: No icterus, extraocular muscles intact  Mouth: Oral mucosa is moist, no lesions on palate,  Neck: Supple, no deformities, masses, or tenderness Lungs: Normal respiratory effort, bilateral clear to auscultation, no crackles or wheezes.  Heart: Regular rate and rhythm, S1 and S2 normal, no murmurs, rubs auscultated Abdomen: BS normoactive,soft,nondistended,non-tender to palpation,no organomegaly Extremities: No pretibial edema, no erythema, no cyanosis, no clubbing Neuro : Alert and oriented to time, place and  person, No focal deficits  Skin: No rashes seen on exam     Data Reviewed: I have personally reviewed following labs and imaging studies  CBG: Recent Labs  Lab 12/06/17 1704 12/06/17 2146 12/07/17 0723 12/07/17 1126 12/07/17 1659  GLUCAP 103* 113* 158* 69 207*    CBC: Recent Labs  Lab 12/05/17 1155  WBC 10.7*  HGB 10.9*  HCT 36.9  MCV 90.2  PLT 142*    Basic Metabolic Panel: Recent Labs  Lab 12/05/17 1155 12/05/17 1711  12/06/17 0438 12/06/17 1909 12/07/17 0438  NA 136 138 139 140 140  K 2.2* 2.6* 2.7* 3.3* 3.5  CL 100* 104 105 108 109  CO2 25 27 29 26 26   GLUCOSE 259* 190* 152* 145* 141*  BUN 12 13 11 9 8   CREATININE 1.00 0.99 0.97 0.86 0.84  CALCIUM 7.3* 7.0* 7.1* 7.3* 7.3*  MG 1.1* 1.8 1.5*  --  1.2*  PHOS  --   --  2.4*  --  1.7*    Recent Results (from the past 240 hour(s))  C difficile quick scan w PCR reflex     Status: None   Collection Time: 12/05/17 12:30 PM  Result Value Ref Range Status   C Diff antigen NEGATIVE NEGATIVE Final    Comment: NEGATIVE NEGATIVE    C Diff toxin NEGATIVE NEGATIVE Final   C Diff interpretation No C. difficile detected.  Final    Comment: NEGATIVE  Gastrointestinal Panel by PCR , Stool     Status: None   Collection Time: 12/05/17 12:30 PM  Result Value Ref Range Status   Campylobacter species NOT DETECTED NOT DETECTED Final   Plesimonas shigelloides NOT DETECTED NOT DETECTED Final   Salmonella species NOT DETECTED NOT DETECTED Final   Yersinia enterocolitica NOT DETECTED NOT DETECTED Final   Vibrio species NOT DETECTED NOT DETECTED Final   Vibrio cholerae NOT DETECTED NOT DETECTED Final   Enteroaggregative E coli (EAEC) NOT DETECTED NOT DETECTED Final   Enteropathogenic E coli (EPEC) NOT DETECTED NOT DETECTED Final   Enterotoxigenic E coli (ETEC) NOT DETECTED NOT DETECTED Final   Shiga like toxin producing E coli (STEC) NOT DETECTED NOT DETECTED Final   Shigella/Enteroinvasive E coli (EIEC) NOT DETECTED NOT DETECTED Final   Cryptosporidium NOT DETECTED NOT DETECTED Final   Cyclospora cayetanensis NOT DETECTED NOT DETECTED Final   Entamoeba histolytica NOT DETECTED NOT DETECTED Final   Giardia lamblia NOT DETECTED NOT DETECTED Final   Adenovirus F40/41 NOT DETECTED NOT DETECTED Final   Astrovirus NOT DETECTED NOT DETECTED Final   Norovirus GI/GII NOT DETECTED NOT DETECTED Final   Rotavirus A NOT DETECTED NOT DETECTED Final   Sapovirus (I, II, IV,  and V) NOT DETECTED NOT DETECTED Final    Comment: Performed at [facility], [address], [place], [place] [address]     Liver Function Tests: Recent Labs  Lab 12/05/17 1155 12/07/17 0438  AST 26  --   ALT 11*  --   ALKPHOS 68  --   BILITOT 0.8  --   PROT 5.2*  --   ALBUMIN 2.6* 2.2*   Recent Labs  Lab 12/05/17 1155  LIPASE 17      Studies: Ct Abdomen Pelvis W Contrast  Result Date: 12/06/2017 CLINICAL DATA:  Chronic diarrhea for 3-4 months, hypertension, hyperlipidemia, history of pulmonary embolism on anti coagulation, type II diabetes mellitus, GERD, generalized weakness, some LEFT lower quadrant tenderness EXAM: CT ABDOMEN AND PELVIS WITH CONTRAST TECHNIQUE: Multidetector  CT imaging of the abdomen and pelvis was performed using the standard protocol following bolus administration of intravenous contrast. Sagittal and coronal MPR images reconstructed from axial data set. CONTRAST:  124mL ISOVUE-300 IOPAMIDOL (ISOVUE-300) INJECTION 61% COMPARISON:  None. FINDINGS: Lower chest: Tiny bibasilar pleural effusions and minimal atelectasis Hepatobiliary: Fatty infiltration of liver. Gallbladder unremarkable. Tiny cyst in caudate lobe liver. Pancreas: Atrophic pancreas with ductal dilatation. Hypoechoic region at pancreatic head which could represent a pancreatic neoplasm or less likely focal pancreatitis, area in question 20 x 21 x 17 mm in size. Spleen: Normal appearance Adrenals/Urinary Tract: Adrenal glands, kidneys, and ureters normal appearance. Small mass with tiny calcifications at the posterior RIGHT bladder base, question tumor, ureterocele with tiny calcifications, partially calcified stone, area in question 13 x 7 x 15 mm in size. Remainder of bladder unremarkable. Stomach/Bowel: Normal appendix. Diverticulosis of descending and sigmoid colon with pericolic infiltrative changes at the descending sigmoid junction suspicious for acute diverticulitis. Additional portions  of the colon demonstrate questionable wall thickening versus artifact from underdistention. Small bowel loops unremarkable. Stomach decompressed. Vascular/Lymphatic: Atherosclerotic calcifications aorta and iliac arteries without aneurysm. Scattered pelvic phleboliths. No adenopathy. Reproductive: Uterus surgically absent.  Normal sized ovaries. Other: No free air or free fluid.  No hernia. Musculoskeletal: Diffuse osseous demineralization scattered degenerative disc and facet disease changes of the thoracolumbar spine. IMPRESSION: Distal colonic diverticulosis with suspected acute diverticulitis of the descending sigmoid junction. Additional scattered areas of questionable colonic wall thickening versus artifact or distension complex not excluded. RIGHT-side bladder base intraluminal mass measuring 13 x 7 x 15 mm, question tumor, ureterocele, partially calcified cyst; correlation with cystoscopy recommended. Poor defined low-attenuation region at pancreatic head 20 x 21 x 17 mm in size, favor tumor though focal pancreatitis not excluded; followup MR imaging abdomen with and without contrast recommended to assess for potential pancreatic neoplasm. Hepatic steatosis. Electronically Signed   By: Lavonia Dana M.D.   On: 12/06/2017 15:29   Mr 3d Recon At Scanner  Result Date: 12/07/2017 CLINICAL DATA:  83 year old inpatient with chronic diarrhea for 3 or 4 months. History of diabetes and pulmonary embolism on anticoagulation. Possible pancreatic head mass on CT. EXAM: MRI ABDOMEN WITHOUT AND WITH CONTRAST (INCLUDING MRCP) TECHNIQUE: Multiplanar multisequence MR imaging of the abdomen was performed both before and after the administration of intravenous contrast. Heavily T2-weighted images of the biliary and pancreatic ducts were obtained, and three-dimensional MRCP images were rendered by post processing. CONTRAST:  4mL MULTIHANCE GADOBENATE DIMEGLUMINE 529 MG/ML IV SOLN COMPARISON:  CT 12/06/2017. FINDINGS: Lower  chest: Small pleural effusions bilaterally with mild atelectasis at both lung bases. The visualized lower chest otherwise appears unremarkable. Hepatobiliary: The liver demonstrates diffuse loss of signal on the gradient echo opposed phase images consistent with steatosis. Stable tiny cyst in the caudate lobe. No other focal hepatic lesions or suspicious enhancement demonstrated. The gallbladder is mildly distended without wall thickening, stones or surrounding inflammation. There is mild extrahepatic biliary dilatation with the common hepatic duct measuring up to 10 mm in diameter. No evidence of choledocholithiasis. The common bile duct appears to abruptly taper within the pancreatic head. Pancreas: The pancreas is atrophied with diffuse main and side duct dilatation, especially in the pancreatic tail. As demonstrated on CT, there is an area of decreased enhancement inferiorly in the pancreatic head, measuring approximately 18 x 14 mm transverse on image 68 of series 1103. This demonstrates a rim of enhancement on the early postcontrast images (series 1102). This remains suspicious for  pancreatic adenocarcinoma in this patient without focal surrounding inflammation or an elevated serum lipase level. There are scattered small cysts throughout the pancreatic body and tail without associated abnormal enhancement. Spleen: Normal in size without focal abnormality. Adrenals/Urinary Tract: Both adrenal glands appear normal. Both kidneys appear normal without evidence of mass or hydronephrosis. Bladder not imaged. Stomach/Bowel: The visualized stomach and small bowel appear normal. There is stable mild diffuse colonic wall thickening. No evidence of bowel obstruction or perforation. Vascular/Lymphatic: There are no enlarged abdominal lymph nodes. No acute vascular findings. No evidence of vascular encasement. Other: Anasarca with generalized subcutaneous edema, greatest in the left flank. No focal extraluminal fluid  collection. Musculoskeletal: No acute or significant osseous findings. IMPRESSION: 1. Persistent concern of pancreatic head mass, worrisome for adenocarcinoma. Associated diffuse pancreatic ductal and mild biliary ductal dilatation. Focal pancreatitis considered less likely given the absence of an elevated serum lipase level or surrounding inflammation. Endoscopic ultrasound for tissue sampling recommended. 2. Diffuse dilatation of the pancreatic duct with multiple small cystic lesions, suggesting previous pancreatitis. Underlying intraductal papillary mucinous tumor is possible. 3. No definite metastatic disease. 4. Anasarca with pleural effusions and generalized soft tissue edema. 5. Stable mild generalized colonic wall thickening. 6. Hepatic steatosis. Electronically Signed   By: Richardean Sale M.D.   On: 12/07/2017 10:25   Mr Abdomen Mrcp Moise Boring Contast  Result Date: 12/07/2017 CLINICAL DATA:  83 year old inpatient with chronic diarrhea for 3 or 4 months. History of diabetes and pulmonary embolism on anticoagulation. Possible pancreatic head mass on CT. EXAM: MRI ABDOMEN WITHOUT AND WITH CONTRAST (INCLUDING MRCP) TECHNIQUE: Multiplanar multisequence MR imaging of the abdomen was performed both before and after the administration of intravenous contrast. Heavily T2-weighted images of the biliary and pancreatic ducts were obtained, and three-dimensional MRCP images were rendered by post processing. CONTRAST:  85mL MULTIHANCE GADOBENATE DIMEGLUMINE 529 MG/ML IV SOLN COMPARISON:  CT 12/06/2017. FINDINGS: Lower chest: Small pleural effusions bilaterally with mild atelectasis at both lung bases. The visualized lower chest otherwise appears unremarkable. Hepatobiliary: The liver demonstrates diffuse loss of signal on the gradient echo opposed phase images consistent with steatosis. Stable tiny cyst in the caudate lobe. No other focal hepatic lesions or suspicious enhancement demonstrated. The gallbladder is mildly  distended without wall thickening, stones or surrounding inflammation. There is mild extrahepatic biliary dilatation with the common hepatic duct measuring up to 10 mm in diameter. No evidence of choledocholithiasis. The common bile duct appears to abruptly taper within the pancreatic head. Pancreas: The pancreas is atrophied with diffuse main and side duct dilatation, especially in the pancreatic tail. As demonstrated on CT, there is an area of decreased enhancement inferiorly in the pancreatic head, measuring approximately 18 x 14 mm transverse on image 68 of series 1103. This demonstrates a rim of enhancement on the early postcontrast images (series 1102). This remains suspicious for pancreatic adenocarcinoma in this patient without focal surrounding inflammation or an elevated serum lipase level. There are scattered small cysts throughout the pancreatic body and tail without associated abnormal enhancement. Spleen: Normal in size without focal abnormality. Adrenals/Urinary Tract: Both adrenal glands appear normal. Both kidneys appear normal without evidence of mass or hydronephrosis. Bladder not imaged. Stomach/Bowel: The visualized stomach and small bowel appear normal. There is stable mild diffuse colonic wall thickening. No evidence of bowel obstruction or perforation. Vascular/Lymphatic: There are no enlarged abdominal lymph nodes. No acute vascular findings. No evidence of vascular encasement. Other: Anasarca with generalized subcutaneous edema, greatest in the left  flank. No focal extraluminal fluid collection. Musculoskeletal: No acute or significant osseous findings. IMPRESSION: 1. Persistent concern of pancreatic head mass, worrisome for adenocarcinoma. Associated diffuse pancreatic ductal and mild biliary ductal dilatation. Focal pancreatitis considered less likely given the absence of an elevated serum lipase level or surrounding inflammation. Endoscopic ultrasound for tissue sampling recommended. 2.  Diffuse dilatation of the pancreatic duct with multiple small cystic lesions, suggesting previous pancreatitis. Underlying intraductal papillary mucinous tumor is possible. 3. No definite metastatic disease. 4. Anasarca with pleural effusions and generalized soft tissue edema. 5. Stable mild generalized colonic wall thickening. 6. Hepatic steatosis. Electronically Signed   By: Richardean Sale M.D.   On: 12/07/2017 10:25    Scheduled Meds: . amLODipine  2.5 mg Oral Daily  . apixaban  5 mg Oral BID  . fludrocortisone  0.1 mg Oral Daily  . imipramine  25 mg Oral QHS  . insulin aspart  0-9 Units Subcutaneous TID WC  . pantoprazole  40 mg Oral Daily  . potassium chloride  40 mEq Oral BID  . cyanocobalamin  1,000 mcg Oral Daily      Time spent: 25 min  [facility] Pager [phone]. If 7PM-7AM, please contact night-coverage at [email or web address], Office  [phone]  password [email or web address]  12/07/2017, 6:44 PM  LOS: 1 day

## 2017-12-08 DIAGNOSIS — K5792 Diverticulitis of intestine, part unspecified, without perforation or abscess without bleeding: Secondary | ICD-10-CM

## 2017-12-08 LAB — HEPATIC FUNCTION PANEL
ALBUMIN: 2.3 g/dL — AB (ref 3.5–5.0)
ALT: 11 U/L — AB (ref 14–54)
AST: 23 U/L (ref 15–41)
Alkaline Phosphatase: 61 U/L (ref 38–126)
TOTAL PROTEIN: 4.7 g/dL — AB (ref 6.5–8.1)
Total Bilirubin: 0.9 mg/dL (ref 0.3–1.2)

## 2017-12-08 LAB — GLUCOSE, CAPILLARY
GLUCOSE-CAPILLARY: 136 mg/dL — AB (ref 65–99)
GLUCOSE-CAPILLARY: 173 mg/dL — AB (ref 65–99)
GLUCOSE-CAPILLARY: 160 mg/dL — AB (ref 65–99)
GLUCOSE-CAPILLARY: 114 mg/dL — AB (ref 65–99)

## 2017-12-08 LAB — LIPASE, BLOOD: Lipase: 15 U/L (ref 11–51)

## 2017-12-08 MED ORDER — LOPERAMIDE HCL 2 MG PO CAPS
2.0000 mg | ORAL_CAPSULE | Freq: Three times a day (TID) | ORAL | Status: DC
  Administered 2017-12-08 – 2017-12-09 (×4): 2 mg via ORAL
  Filled 2017-12-08 (×3): qty 1

## 2017-12-08 NOTE — Progress Notes (Signed)
[facility]  PROGRESS NOTE  [name] MLY:[id number] DOB: 25-Apr-1934 DOA: 12/05/2017 PCP: Marletta Lor, MD   Brief HPI:    83 year old female with history of chronic diarrhea for about 3-4 months which was not fully workup, prior stroke, hypertension, hyperlipidemia, pulmonary embolism on anticoagulation, GERD, type 2 diabetes, prior admission for hypokalemia likely in the setting of diarrhea presented with generalized weakness and persistent diarrhea.  Patient and her daughter reported that they follow with PCP but has not seen GI.     Subjective   Patient seen and examined, still having loose bowel movements.   Assessment/Plan:     1. Chronic diarrhea- CT abdomen pelvis showed distal colonic diverticulosis with suspected acute diverticulitis of the descending sigmoid junction. Patient is currently on Levaquin and Flagyl. 2. Pancreatic mass- MRI abdomen showed concern for pancreatic head mass worrisome for adenocarcinoma.  Ca 19-9, AFP is pending. G was consulted and did not recommend endoscopic ultrasound and biopsy if patient doesn't want treatment. Will await tumor markers, likely discharge home on hospice. 3. History of pulmonary embolism-continue eliquis 4. Essential hypertension-continue Norvasc 5. New onset A. Fib- in setting electrolyte abnormalities. Echocardiogram showed moderate concentric hypertrophy EF 55%. Patient is currently on eliquis for anti-coagulation.    DVT prophylaxis: Eliquis  Code Status: Full code  Family Communication: Discussed with patient's daughter at bedside   Disposition Plan: To be decided after GI evaluation   Consultants:  None   Procedures:  None   Continuous infusions . 0.9 % NaCl with KCl 40 mEq / L 75 mL/hr (12/08/17 0023)  . levofloxacin (LEVAQUIN) IV Stopped (12/06/17 1845)  . metronidazole Stopped (12/08/17 1200)      Antibiotics:   Anti-infectives (From admission, onward)   Start     Dose/Rate  Route Frequency Ordered Stop   12/06/17 1700  metroNIDAZOLE (FLAGYL) IVPB 500 mg     500 mg 100 mL/hr over 60 Minutes Intravenous Every 8 hours 12/06/17 1606     12/06/17 1700  levofloxacin (LEVAQUIN) IVPB 750 mg     750 mg 100 mL/hr over 90 Minutes Intravenous Every 48 hours 12/06/17 1623         Objective   Vitals:   12/07/17 1318 12/07/17 2050 12/08/17 0512 12/08/17 1321  BP: (!) 157/82 (!) 150/92 (!) 150/91   Pulse: (!) 109 98 99 (!) 104  Resp: 16 16 16 16   Temp: 98.2 F (36.8 C) 98.5 F (36.9 C) 98.3 F (36.8 C) 97.8 F (36.6 C)  TempSrc: Oral Oral Oral Oral  SpO2: 99% 97% 97% 100%  Weight:   79.5 kg (175 lb 4.3 oz)   Height:        Intake/Output Summary (Last 24 hours) at 12/08/2017 1618 Last data filed at 12/08/2017 0600 Gross per 24 hour  Intake 2105 ml  Output 2 ml  Net 2103 ml   Filed Weights   12/06/17 0600 12/07/17 0540 12/08/17 0512  Weight: 75.4 kg (166 lb 3.6 oz) 77.9 kg (171 lb 11.8 oz) 79.5 kg (175 lb 4.3 oz)     Physical Examination:  Physical Exam: Eyes: No icterus, extraocular muscles intact  Mouth: Oral mucosa is moist, no lesions on palate,  Neck: Supple, no deformities, masses, or tenderness Lungs: Normal respiratory effort, bilateral clear to auscultation, no crackles or wheezes.  Heart: Regular rate and rhythm, S1 and S2 normal, no murmurs, rubs auscultated Abdomen: BS normoactive,soft,nondistended,non-tender to palpation,no organomegaly Extremities: No pretibial edema, no erythema, no cyanosis, no clubbing Neuro :  Alert and oriented to time, place and person, No focal deficits Skin: No rashes seen on exam    Data Reviewed: I have personally reviewed following labs and imaging studies  CBG: Recent Labs  Lab 12/07/17 1126 12/07/17 1659 12/07/17 2048 12/08/17 0728 12/08/17 1149  GLUCAP 69 207* 121* 136* 173*    CBC: Recent Labs  Lab 12/05/17 1155  WBC 10.7*  HGB 10.9*  HCT 36.9  MCV 90.2  PLT 142*    Basic  Metabolic Panel: Recent Labs  Lab 12/05/17 1155 12/05/17 1711 12/06/17 0438 12/06/17 1909 12/07/17 0438  NA 136 138 139 140 140  K 2.2* 2.6* 2.7* 3.3* 3.5  CL 100* 104 105 108 109  CO2 25 27 29 26 26   GLUCOSE 259* 190* 152* 145* 141*  BUN 12 13 11 9 8   CREATININE 1.00 0.99 0.97 0.86 0.84  CALCIUM 7.3* 7.0* 7.1* 7.3* 7.3*  MG 1.1* 1.8 1.5*  --  1.2*  PHOS  --   --  2.4*  --  1.7*    Recent Results (from the past 240 hour(s))  C difficile quick scan w PCR reflex     Status: None   Collection Time: 12/05/17 12:30 PM  Result Value Ref Range Status   C Diff antigen NEGATIVE NEGATIVE Final    Comment: NEGATIVE NEGATIVE    C Diff toxin NEGATIVE NEGATIVE Final   C Diff interpretation No C. difficile detected.  Final    Comment: NEGATIVE  Gastrointestinal Panel by PCR , Stool     Status: None   Collection Time: 12/05/17 12:30 PM  Result Value Ref Range Status   Campylobacter species NOT DETECTED NOT DETECTED Final   Plesimonas shigelloides NOT DETECTED NOT DETECTED Final   Salmonella species NOT DETECTED NOT DETECTED Final   Yersinia enterocolitica NOT DETECTED NOT DETECTED Final   Vibrio species NOT DETECTED NOT DETECTED Final   Vibrio cholerae NOT DETECTED NOT DETECTED Final   Enteroaggregative E coli (EAEC) NOT DETECTED NOT DETECTED Final   Enteropathogenic E coli (EPEC) NOT DETECTED NOT DETECTED Final   Enterotoxigenic E coli (ETEC) NOT DETECTED NOT DETECTED Final   Shiga like toxin producing E coli (STEC) NOT DETECTED NOT DETECTED Final   Shigella/Enteroinvasive E coli (EIEC) NOT DETECTED NOT DETECTED Final   Cryptosporidium NOT DETECTED NOT DETECTED Final   Cyclospora cayetanensis NOT DETECTED NOT DETECTED Final   Entamoeba histolytica NOT DETECTED NOT DETECTED Final   Giardia lamblia NOT DETECTED NOT DETECTED Final   Adenovirus F40/41 NOT DETECTED NOT DETECTED Final   Astrovirus NOT DETECTED NOT DETECTED Final   Norovirus GI/GII NOT DETECTED NOT DETECTED Final    Rotavirus A NOT DETECTED NOT DETECTED Final   Sapovirus (I, II, IV, and V) NOT DETECTED NOT DETECTED Final    Comment: Performed at [facility], [address] [place], [place] [address]     Liver Function Tests: Recent Labs  Lab 12/05/17 1155 12/07/17 0438 12/08/17 1507  AST 26  --  23  ALT 11*  --  11*  ALKPHOS 68  --  61  BILITOT 0.8  --  0.9  PROT 5.2*  --  4.7*  ALBUMIN 2.6* 2.2* 2.3*   Recent Labs  Lab 12/05/17 1155 12/08/17 1507  LIPASE 17 15      Studies: Mr 3d Recon At Scanner  Result Date: 12/07/2017 CLINICAL DATA:  83 year old inpatient with chronic diarrhea for 3 or 4 months. History of diabetes and pulmonary embolism on anticoagulation. Possible  pancreatic head mass on CT. EXAM: MRI ABDOMEN WITHOUT AND WITH CONTRAST (INCLUDING MRCP) TECHNIQUE: Multiplanar multisequence MR imaging of the abdomen was performed both before and after the administration of intravenous contrast. Heavily T2-weighted images of the biliary and pancreatic ducts were obtained, and three-dimensional MRCP images were rendered by post processing. CONTRAST:  45mL MULTIHANCE GADOBENATE DIMEGLUMINE 529 MG/ML IV SOLN COMPARISON:  CT 12/06/2017. FINDINGS: Lower chest: Small pleural effusions bilaterally with mild atelectasis at both lung bases. The visualized lower chest otherwise appears unremarkable. Hepatobiliary: The liver demonstrates diffuse loss of signal on the gradient echo opposed phase images consistent with steatosis. Stable tiny cyst in the caudate lobe. No other focal hepatic lesions or suspicious enhancement demonstrated. The gallbladder is mildly distended without wall thickening, stones or surrounding inflammation. There is mild extrahepatic biliary dilatation with the common hepatic duct measuring up to 10 mm in diameter. No evidence of choledocholithiasis. The common bile duct appears to abruptly taper within the pancreatic head. Pancreas: The pancreas is atrophied with diffuse  main and side duct dilatation, especially in the pancreatic tail. As demonstrated on CT, there is an area of decreased enhancement inferiorly in the pancreatic head, measuring approximately 18 x 14 mm transverse on image 68 of series 1103. This demonstrates a rim of enhancement on the early postcontrast images (series 1102). This remains suspicious for pancreatic adenocarcinoma in this patient without focal surrounding inflammation or an elevated serum lipase level. There are scattered small cysts throughout the pancreatic body and tail without associated abnormal enhancement. Spleen: Normal in size without focal abnormality. Adrenals/Urinary Tract: Both adrenal glands appear normal. Both kidneys appear normal without evidence of mass or hydronephrosis. Bladder not imaged. Stomach/Bowel: The visualized stomach and small bowel appear normal. There is stable mild diffuse colonic wall thickening. No evidence of bowel obstruction or perforation. Vascular/Lymphatic: There are no enlarged abdominal lymph nodes. No acute vascular findings. No evidence of vascular encasement. Other: Anasarca with generalized subcutaneous edema, greatest in the left flank. No focal extraluminal fluid collection. Musculoskeletal: No acute or significant osseous findings. IMPRESSION: 1. Persistent concern of pancreatic head mass, worrisome for adenocarcinoma. Associated diffuse pancreatic ductal and mild biliary ductal dilatation. Focal pancreatitis considered less likely given the absence of an elevated serum lipase level or surrounding inflammation. Endoscopic ultrasound for tissue sampling recommended. 2. Diffuse dilatation of the pancreatic duct with multiple small cystic lesions, suggesting previous pancreatitis. Underlying intraductal papillary mucinous tumor is possible. 3. No definite metastatic disease. 4. Anasarca with pleural effusions and generalized soft tissue edema. 5. Stable mild generalized colonic wall thickening. 6. Hepatic  steatosis. Electronically Signed   By: Richardean Sale M.D.   On: 12/07/2017 10:25   Mr Abdomen Mrcp Moise Boring Contast  Result Date: 12/07/2017 CLINICAL DATA:  83 year old inpatient with chronic diarrhea for 3 or 4 months. History of diabetes and pulmonary embolism on anticoagulation. Possible pancreatic head mass on CT. EXAM: MRI ABDOMEN WITHOUT AND WITH CONTRAST (INCLUDING MRCP) TECHNIQUE: Multiplanar multisequence MR imaging of the abdomen was performed both before and after the administration of intravenous contrast. Heavily T2-weighted images of the biliary and pancreatic ducts were obtained, and three-dimensional MRCP images were rendered by post processing. CONTRAST:  71mL MULTIHANCE GADOBENATE DIMEGLUMINE 529 MG/ML IV SOLN COMPARISON:  CT 12/06/2017. FINDINGS: Lower chest: Small pleural effusions bilaterally with mild atelectasis at both lung bases. The visualized lower chest otherwise appears unremarkable. Hepatobiliary: The liver demonstrates diffuse loss of signal on the gradient echo opposed phase images consistent with steatosis. Stable tiny  cyst in the caudate lobe. No other focal hepatic lesions or suspicious enhancement demonstrated. The gallbladder is mildly distended without wall thickening, stones or surrounding inflammation. There is mild extrahepatic biliary dilatation with the common hepatic duct measuring up to 10 mm in diameter. No evidence of choledocholithiasis. The common bile duct appears to abruptly taper within the pancreatic head. Pancreas: The pancreas is atrophied with diffuse main and side duct dilatation, especially in the pancreatic tail. As demonstrated on CT, there is an area of decreased enhancement inferiorly in the pancreatic head, measuring approximately 18 x 14 mm transverse on image 68 of series 1103. This demonstrates a rim of enhancement on the early postcontrast images (series 1102). This remains suspicious for pancreatic adenocarcinoma in this patient without focal  surrounding inflammation or an elevated serum lipase level. There are scattered small cysts throughout the pancreatic body and tail without associated abnormal enhancement. Spleen: Normal in size without focal abnormality. Adrenals/Urinary Tract: Both adrenal glands appear normal. Both kidneys appear normal without evidence of mass or hydronephrosis. Bladder not imaged. Stomach/Bowel: The visualized stomach and small bowel appear normal. There is stable mild diffuse colonic wall thickening. No evidence of bowel obstruction or perforation. Vascular/Lymphatic: There are no enlarged abdominal lymph nodes. No acute vascular findings. No evidence of vascular encasement. Other: Anasarca with generalized subcutaneous edema, greatest in the left flank. No focal extraluminal fluid collection. Musculoskeletal: No acute or significant osseous findings. IMPRESSION: 1. Persistent concern of pancreatic head mass, worrisome for adenocarcinoma. Associated diffuse pancreatic ductal and mild biliary ductal dilatation. Focal pancreatitis considered less likely given the absence of an elevated serum lipase level or surrounding inflammation. Endoscopic ultrasound for tissue sampling recommended. 2. Diffuse dilatation of the pancreatic duct with multiple small cystic lesions, suggesting previous pancreatitis. Underlying intraductal papillary mucinous tumor is possible. 3. No definite metastatic disease. 4. Anasarca with pleural effusions and generalized soft tissue edema. 5. Stable mild generalized colonic wall thickening. 6. Hepatic steatosis. Electronically Signed   By: Richardean Sale M.D.   On: 12/07/2017 10:25    Scheduled Meds: . amLODipine  2.5 mg Oral Daily  . apixaban  5 mg Oral BID  . fludrocortisone  0.1 mg Oral Daily  . imipramine  25 mg Oral QHS  . insulin aspart  0-9 Units Subcutaneous TID WC  . loperamide  2 mg Oral TID  . pantoprazole  40 mg Oral Daily  . potassium chloride  40 mEq Oral BID  . cyanocobalamin   1,000 mcg Oral Daily      Time spent: 25 min  [facility] Pager [phone]. If 7PM-7AM, please contact night-coverage at [email or web address], Office  [phone]  password [email or web address]  12/08/2017, 4:18 PM  LOS: 2 days

## 2017-12-08 NOTE — Consult Note (Signed)
Referring Provider: Dr. Lama Primary Care Physician:  Kwiatkowski, Peter F, MD Primary Gastroenterologist:  Unassigned  Reason for Consultation:  Pancreatic mass; Diarrhea  HPI: [name] is a 83 y.o. female with a 6 month history of watery diarrhea who during workup for that was found to have a pancreatic head mass seen on CT and MRI. LFTs, lipase normal on 12/05/17. Having intermittent diffuse abdominal pain. Daughters at bedside and report that she has had multiple watery stools thus far today (7 reported in chart). On Eliquis for Afib and PE. CA19-9 and AFP pending.   Past Medical History:  Diagnosis Date  . Aortic stenosis   . CVA (cerebral infarction)   . Depression   . Diabetes mellitus   . DVT (deep venous thrombosis) (HCC)   . GERD (gastroesophageal reflux disease)   . Hyperlipidemia   . Hypertension   . Osteoarthritis   . Pulmonary embolism (HCC)     Past Surgical History:  Procedure Laterality Date  . ABDOMINAL HYSTERECTOMY     fibroids, no cancer  . FRACTURE SURGERY     left leg  . JOINT REPLACEMENT     left and right knee  . ROTATOR CUFF REPAIR    . TEMPOROMANDIBULAR JOINT SURGERY      Prior to Admission medications   Medication Sig Start Date End Date Taking? Authorizing Provider  acetaminophen (TYLENOL) 500 MG tablet Take 1,000 mg by mouth every 6 (six) hours as needed for mild pain.   Yes [provider]  amLODipine (NORVASC) 5 MG tablet Take 0.5 tablets (2.5 mg total) daily by mouth. 10/27/17  Yes Alekh, Kshitiz, MD  apixaban (ELIQUIS) 5 MG TABS tablet Take 1 tablet (5 mg total) by mouth 2 (two) times daily. 11/06/17  Yes Kwiatkowski, Peter F, MD  atorvastatin (LIPITOR) 20 MG tablet TAKE 1 TABLET (20 MG TOTAL) BY MOUTH DAILY. 11/13/17  Yes Kwiatkowski, Peter F, MD  cyanocobalamin 1000 MCG tablet Take 1,000 mcg by mouth daily.   Yes [provider]  esomeprazole (NEXIUM) 40 MG capsule Take 1 capsule (40 mg total) by mouth daily. 11/15/17   Yes Kwiatkowski, Peter F, MD  fludrocortisone (FLORINEF) 0.1 MG tablet Take 0.5 tablets (0.05 mg total) by mouth daily. Patient taking differently: Take 0.1 mg by mouth daily.  11/15/17  Yes Kwiatkowski, Peter F, MD  glipiZIDE (GLUCOTROL XL) 2.5 MG 24 hr tablet TAKE 1 TABLET (2.5 MG TOTAL) BY MOUTH DAILY WITH BREAKFAST. 11/21/17  Yes Kwiatkowski, Peter F, MD  imipramine (TOFRANIL) 25 MG tablet TAKE 1 TABLET BY MOUTH EVERY DAY Patient taking differently: TAKE 25mg BY MOUTH EVERY DAY 11/13/17  Yes Kwiatkowski, Peter F, MD  magnesium oxide (MAG-OX) 400 MG tablet TAKE 1 TABLET (400 MG TOTAL) BY MOUTH 3 (THREE) TIMES DAILY WITH MEALS. 11/28/17  Yes Kwiatkowski, Peter F, MD  metFORMIN (GLUCOPHAGE) 500 MG tablet Take 1 tablet (500 mg total) daily with breakfast by mouth. 10/16/17  Yes Kwiatkowski, Peter F, MD  Blood Glucose Monitoring Suppl (ONE TOUCH ULTRA 2) w/Device KIT 1 Device 3 (three) times daily before meals by Does not apply route. Dx :E11.9 10/23/17   Kwiatkowski, Peter F, MD  glucose blood test strip Use to check CBG TID. Dx: E11.9 10/23/17   Kwiatkowski, Peter F, MD  Lancet Device MISC Use to check blood sugar three times a day before meals 11/05/17   Kwiatkowski, Peter F, MD  Lancets (ONETOUCH ULTRASOFT) lancets Use to check CBG TID. Dx : E11.9 10/23/17   Kwiatkowski,   Peter F, MD    Scheduled Meds: . amLODipine  2.5 mg Oral Daily  . apixaban  5 mg Oral BID  . fludrocortisone  0.1 mg Oral Daily  . imipramine  25 mg Oral QHS  . insulin aspart  0-9 Units Subcutaneous TID WC  . pantoprazole  40 mg Oral Daily  . potassium chloride  40 mEq Oral BID  . cyanocobalamin  1,000 mcg Oral Daily   Continuous Infusions: . 0.9 % NaCl with KCl 40 mEq / L 75 mL/hr (12/08/17 0023)  . levofloxacin (LEVAQUIN) IV Stopped (12/06/17 1845)  . metronidazole Stopped (12/08/17 1200)   PRN Meds:.acetaminophen, hydrALAZINE, loperamide, ondansetron **OR** ondansetron (ZOFRAN) IV  Allergies as of 12/05/2017 -  Review Complete 12/05/2017  Allergen Reaction Noted  . Cymbalta [duloxetine hcl] Anaphylaxis 01/18/2011  . Oxycodone Other (See Comments) 02/04/2012  . Ramipril Other (See Comments) 12/28/2006  . Hydrochlorothiazide Rash   . Telmisartan-hctz Rash 12/28/2006    Family History  Problem Relation Age of Onset  . Dementia Mother   . Hypertension Mother   . Heart attack Father   . Heart failure Sister     Social History   Socioeconomic History  . Marital status: Widowed    Spouse name: Not on file  . Number of children: Not on file  . Years of education: Not on file  . Highest education level: Not on file  Social Needs  . Financial resource strain: Not on file  . Food insecurity - worry: Not on file  . Food insecurity - inability: Not on file  . Transportation needs - medical: Not on file  . Transportation needs - non-medical: Not on file  Occupational History  . Not on file  Tobacco Use  . Smoking status: Former Smoker    Last attempt to quit: 02/07/1981    Years since quitting: 36.8  . Smokeless tobacco: Never Used  Substance and Sexual Activity  . Alcohol use: No  . Drug use: No  . Sexual activity: Not on file  Other Topics Concern  . Not on file  Social History Narrative  . Not on file    Review of Systems: All negative except as stated above in HPI.  Physical Exam: Vital signs: Vitals:   12/08/17 0512 12/08/17 1321  BP: (!) 150/91   Pulse: 99 (!) 104  Resp: 16 16  Temp: 98.3 F (36.8 C) 97.8 F (36.6 C)  SpO2: 97% 100%   Last BM Date: 12/07/17 General:   Lethargic, elderly, Well-developed, well-nourished, pleasant and cooperative in NAD Head: normocephalic, atraumatic Eyes: anicteric sclera ENT: oropharynx clear Neck: supple, nontender Lungs:  Clear throughout to auscultation.   No wheezes, crackles, or rhonchi. No acute distress. Heart:  Regular rate and rhythm; no murmurs, clicks, rubs,  or gallops. Abdomen: diffuse tenderness with guarding, soft,  nondistended, +BS  Rectal:  Deferred Ext: no edema  GI:  Lab Results: No results for input(s): WBC, HGB, HCT, PLT in the last 72 hours. BMET Recent Labs    12/06/17 0438 12/06/17 1909 12/07/17 0438  NA 139 140 140  K 2.7* 3.3* 3.5  CL 105 108 109  CO2 29 26 26  GLUCOSE 152* 145* 141*  BUN 11 9 8  CREATININE 0.97 0.86 0.84  CALCIUM 7.1* 7.3* 7.3*   LFT Recent Labs    12/07/17 0438  ALBUMIN 2.2*   PT/INR No results for input(s): LABPROT, INR in the last 72 hours.   Studies/Results: Ct Abdomen Pelvis W Contrast    Result Date: 12/06/2017 CLINICAL DATA:  Chronic diarrhea for 3-4 months, hypertension, hyperlipidemia, history of pulmonary embolism on anti coagulation, type II diabetes mellitus, GERD, generalized weakness, some LEFT lower quadrant tenderness EXAM: CT ABDOMEN AND PELVIS WITH CONTRAST TECHNIQUE: Multidetector CT imaging of the abdomen and pelvis was performed using the standard protocol following bolus administration of intravenous contrast. Sagittal and coronal MPR images reconstructed from axial data set. CONTRAST:  100mL ISOVUE-300 IOPAMIDOL (ISOVUE-300) INJECTION 61% COMPARISON:  None. FINDINGS: Lower chest: Tiny bibasilar pleural effusions and minimal atelectasis Hepatobiliary: Fatty infiltration of liver. Gallbladder unremarkable. Tiny cyst in caudate lobe liver. Pancreas: Atrophic pancreas with ductal dilatation. Hypoechoic region at pancreatic head which could represent a pancreatic neoplasm or less likely focal pancreatitis, area in question 20 x 21 x 17 mm in size. Spleen: Normal appearance Adrenals/Urinary Tract: Adrenal glands, kidneys, and ureters normal appearance. Small mass with tiny calcifications at the posterior RIGHT bladder base, question tumor, ureterocele with tiny calcifications, partially calcified stone, area in question 13 x 7 x 15 mm in size. Remainder of bladder unremarkable. Stomach/Bowel: Normal appendix. Diverticulosis of descending and  sigmoid colon with pericolic infiltrative changes at the descending sigmoid junction suspicious for acute diverticulitis. Additional portions of the colon demonstrate questionable wall thickening versus artifact from underdistention. Small bowel loops unremarkable. Stomach decompressed. Vascular/Lymphatic: Atherosclerotic calcifications aorta and iliac arteries without aneurysm. Scattered pelvic phleboliths. No adenopathy. Reproductive: Uterus surgically absent.  Normal sized ovaries. Other: No free air or free fluid.  No hernia. Musculoskeletal: Diffuse osseous demineralization scattered degenerative disc and facet disease changes of the thoracolumbar spine. IMPRESSION: Distal colonic diverticulosis with suspected acute diverticulitis of the descending sigmoid junction. Additional scattered areas of questionable colonic wall thickening versus artifact or distension complex not excluded. RIGHT-side bladder base intraluminal mass measuring 13 x 7 x 15 mm, question tumor, ureterocele, partially calcified cyst; correlation with cystoscopy recommended. Poor defined low-attenuation region at pancreatic head 20 x 21 x 17 mm in size, favor tumor though focal pancreatitis not excluded; followup MR imaging abdomen with and without contrast recommended to assess for potential pancreatic neoplasm. Hepatic steatosis. Electronically Signed   By: Mark  Boles M.D.   On: 12/06/2017 15:29   Mr 3d Recon At Scanner  Result Date: 12/07/2017 CLINICAL DATA:  83-year-old inpatient with chronic diarrhea for 3 or 4 months. History of diabetes and pulmonary embolism on anticoagulation. Possible pancreatic head mass on CT. EXAM: MRI ABDOMEN WITHOUT AND WITH CONTRAST (INCLUDING MRCP) TECHNIQUE: Multiplanar multisequence MR imaging of the abdomen was performed both before and after the administration of intravenous contrast. Heavily T2-weighted images of the biliary and pancreatic ducts were obtained, and three-dimensional MRCP images were  rendered by post processing. CONTRAST:  16mL MULTIHANCE GADOBENATE DIMEGLUMINE 529 MG/ML IV SOLN COMPARISON:  CT 12/06/2017. FINDINGS: Lower chest: Small pleural effusions bilaterally with mild atelectasis at both lung bases. The visualized lower chest otherwise appears unremarkable. Hepatobiliary: The liver demonstrates diffuse loss of signal on the gradient echo opposed phase images consistent with steatosis. Stable tiny cyst in the caudate lobe. No other focal hepatic lesions or suspicious enhancement demonstrated. The gallbladder is mildly distended without wall thickening, stones or surrounding inflammation. There is mild extrahepatic biliary dilatation with the common hepatic duct measuring up to 10 mm in diameter. No evidence of choledocholithiasis. The common bile duct appears to abruptly taper within the pancreatic head. Pancreas: The pancreas is atrophied with diffuse main and side duct dilatation, especially in the pancreatic tail. As demonstrated on CT, there   is an area of decreased enhancement inferiorly in the pancreatic head, measuring approximately 18 x 14 mm transverse on image 68 of series 1103. This demonstrates a rim of enhancement on the early postcontrast images (series 1102). This remains suspicious for pancreatic adenocarcinoma in this patient without focal surrounding inflammation or an elevated serum lipase level. There are scattered small cysts throughout the pancreatic body and tail without associated abnormal enhancement. Spleen: Normal in size without focal abnormality. Adrenals/Urinary Tract: Both adrenal glands appear normal. Both kidneys appear normal without evidence of mass or hydronephrosis. Bladder not imaged. Stomach/Bowel: The visualized stomach and small bowel appear normal. There is stable mild diffuse colonic wall thickening. No evidence of bowel obstruction or perforation. Vascular/Lymphatic: There are no enlarged abdominal lymph nodes. No acute vascular findings. No  evidence of vascular encasement. Other: Anasarca with generalized subcutaneous edema, greatest in the left flank. No focal extraluminal fluid collection. Musculoskeletal: No acute or significant osseous findings. IMPRESSION: 1. Persistent concern of pancreatic head mass, worrisome for adenocarcinoma. Associated diffuse pancreatic ductal and mild biliary ductal dilatation. Focal pancreatitis considered less likely given the absence of an elevated serum lipase level or surrounding inflammation. Endoscopic ultrasound for tissue sampling recommended. 2. Diffuse dilatation of the pancreatic duct with multiple small cystic lesions, suggesting previous pancreatitis. Underlying intraductal papillary mucinous tumor is possible. 3. No definite metastatic disease. 4. Anasarca with pleural effusions and generalized soft tissue edema. 5. Stable mild generalized colonic wall thickening. 6. Hepatic steatosis. Electronically Signed   By: Richardean Sale M.D.   On: 12/07/2017 10:25   Mr Abdomen Mrcp Moise Boring Contast  Result Date: 12/07/2017 CLINICAL DATA:  83 year old inpatient with chronic diarrhea for 3 or 4 months. History of diabetes and pulmonary embolism on anticoagulation. Possible pancreatic head mass on CT. EXAM: MRI ABDOMEN WITHOUT AND WITH CONTRAST (INCLUDING MRCP) TECHNIQUE: Multiplanar multisequence MR imaging of the abdomen was performed both before and after the administration of intravenous contrast. Heavily T2-weighted images of the biliary and pancreatic ducts were obtained, and three-dimensional MRCP images were rendered by post processing. CONTRAST:  50m MULTIHANCE GADOBENATE DIMEGLUMINE 529 MG/ML IV SOLN COMPARISON:  CT 12/06/2017. FINDINGS: Lower chest: Small pleural effusions bilaterally with mild atelectasis at both lung bases. The visualized lower chest otherwise appears unremarkable. Hepatobiliary: The liver demonstrates diffuse loss of signal on the gradient echo opposed phase images consistent with  steatosis. Stable tiny cyst in the caudate lobe. No other focal hepatic lesions or suspicious enhancement demonstrated. The gallbladder is mildly distended without wall thickening, stones or surrounding inflammation. There is mild extrahepatic biliary dilatation with the common hepatic duct measuring up to 10 mm in diameter. No evidence of choledocholithiasis. The common bile duct appears to abruptly taper within the pancreatic head. Pancreas: The pancreas is atrophied with diffuse main and side duct dilatation, especially in the pancreatic tail. As demonstrated on CT, there is an area of decreased enhancement inferiorly in the pancreatic head, measuring approximately 18 x 14 mm transverse on image 68 of series 1103. This demonstrates a rim of enhancement on the early postcontrast images (series 1102). This remains suspicious for pancreatic adenocarcinoma in this patient without focal surrounding inflammation or an elevated serum lipase level. There are scattered small cysts throughout the pancreatic body and tail without associated abnormal enhancement. Spleen: Normal in size without focal abnormality. Adrenals/Urinary Tract: Both adrenal glands appear normal. Both kidneys appear normal without evidence of mass or hydronephrosis. Bladder not imaged. Stomach/Bowel: The visualized stomach and small bowel appear normal. There  is stable mild diffuse colonic wall thickening. No evidence of bowel obstruction or perforation. Vascular/Lymphatic: There are no enlarged abdominal lymph nodes. No acute vascular findings. No evidence of vascular encasement. Other: Anasarca with generalized subcutaneous edema, greatest in the left flank. No focal extraluminal fluid collection. Musculoskeletal: No acute or significant osseous findings. IMPRESSION: 1. Persistent concern of pancreatic head mass, worrisome for adenocarcinoma. Associated diffuse pancreatic ductal and mild biliary ductal dilatation. Focal pancreatitis considered less  likely given the absence of an elevated serum lipase level or surrounding inflammation. Endoscopic ultrasound for tissue sampling recommended. 2. Diffuse dilatation of the pancreatic duct with multiple small cystic lesions, suggesting previous pancreatitis. Underlying intraductal papillary mucinous tumor is possible. 3. No definite metastatic disease. 4. Anasarca with pleural effusions and generalized soft tissue edema. 5. Stable mild generalized colonic wall thickening. 6. Hepatic steatosis. Electronically Signed   By: Richardean Sale M.D.   On: 12/07/2017 10:25    Impression/Plan: Pancreatic mass seen on CT/MRI concerning for pancreatic cancer along with chronic diarrhea that is likely due to the pancreatic mass. If the diarrhea was controlled, then she could go home but due to risk of dehydration and hypokalemia from the diarrhea will need to stay in the hospital for now on IVFs. Imodium is prn but do not see where she has received it despite the frequent diarrhea. GI pathogen panel is negative. Imodium changed to scheduled to see if diarrhea can be controlled. No liver mets seen on MRI. Recheck LFTs, lipase and if within normal limits then ok to continue solid food. She likely has pancreatic cancer but if cancer treatment is planned then tissue diagnosis is needed by EUS. EUS could be done as an outpt (earliest would be middle part of next week) if the severe diarrhea was not occurring requiring inpatient admission for IVFs. Also on IV Abx for presumed diverticulitis. Upon further discussion with her, she expresses that she would NOT want chemotherapy or other treatment if cancer is confirmed and due to her desire I would NOT recommend an invasive procedure, such as an EUS for biopsy since she would not want treatment. Await cancer markers and if elevated then I think she likely has pancreatic cancer. If she still feels that she would not want chemotherapy or other treatment for her presumed pancreatic  cancer upon further discussion with her family, then I would recommend a palliative care consult if the tumor marker CA19-9 points toward pancreatic cancer. Will follow.    LOS: 2 days   [place] C.  12/08/2017, 2:31 PM  Pager [phone]  AFTER 5 pm or on weekends please call [phone]

## 2017-12-09 LAB — GLUCOSE, CAPILLARY: Glucose-Capillary: 120 mg/dL — ABNORMAL HIGH (ref 65–99)

## 2017-12-09 LAB — AFP TUMOR MARKER: AFP, Serum, Tumor Marker: 1.9 ng/mL (ref 0.0–8.3)

## 2017-12-09 LAB — CANCER ANTIGEN 19-9: CA 19-9: 319 U/mL — ABNORMAL HIGH (ref 0–35)

## 2017-12-09 MED ORDER — HYDROCODONE-ACETAMINOPHEN 5-325 MG PO TABS: 1.0000 | ORAL_TABLET | Freq: Four times a day (QID) | ORAL | 0 refills | Status: AC | PRN

## 2017-12-09 MED ORDER — CIPROFLOXACIN HCL 250 MG PO TABS: 250.0000 mg | ORAL_TABLET | Freq: Two times a day (BID) | ORAL | 0 refills | Status: AC

## 2017-12-09 MED ORDER — LOPERAMIDE HCL 2 MG PO CAPS: 2.0000 mg | ORAL_CAPSULE | Freq: Three times a day (TID) | ORAL | 2 refills | Status: AC

## 2017-12-09 MED ORDER — METRONIDAZOLE 500 MG PO TABS
500.0000 mg | ORAL_TABLET | Freq: Three times a day (TID) | ORAL | 0 refills | Status: AC
Start: 2017-12-09 — End: 2017-12-12

## 2017-12-09 NOTE — Progress Notes (Signed)
[facility] Gastroenterology Progress Note  [name] 83 y.o. 29-Feb-1934   Subjective: Feels ok. Diarrhea is resolving on Imodium. Daughter in room.  Objective: Vital signs: Vitals:   12/08/17 2149 12/09/17 0550  BP: (!) 143/97 (!) 160/93  Pulse: 100 (!) 106  Resp: 18 18  Temp: 98.7 F (37.1 C) 98.7 F (37.1 C)  SpO2: 100% 96%    Physical Exam: Gen: elderly, lethargic, no acute distress  HEENT: anicteric sclera CV: RRR Chest: CTA B Abd: diffuse tenderness with guarding, +mild distention, +BS Ext: no edema  Lab Results: Recent Labs    12/06/17 1909 12/07/17 0438  NA 140 140  K 3.3* 3.5  CL 108 109  CO2 26 26  GLUCOSE 145* 141*  BUN 9 8  CREATININE 0.86 0.84  CALCIUM 7.3* 7.3*  MG  --  1.2*  PHOS  --  1.7*   Recent Labs    12/07/17 0438 12/08/17 1507  AST  --  23  ALT  --  11*  ALKPHOS  --  61  BILITOT  --  0.9  PROT  --  4.7*  ALBUMIN 2.2* 2.3*   No results for input(s): WBC, NEUTROABS, HGB, HCT, MCV, PLT in the last 72 hours.    Assessment/Plan: Pancreatic mass likely malignant. CA19-9 pending (will call her as outpt with results). Patient adamant that she does NOT want any treatment for this mass and wants to go home today if her pain can be controlled. No plans for EUS since treatment not desired. She is agreeable to home hospice. Diarrhea has improved on scheduled Imodium. Tolerating regular diet. D/W Dr. Darrick Meigs who will arrange home hospice. Will sign off. Call if questions. No f/u with GI needed.   [place] C. 12/09/2017, 1:55 PM  Pager [phone]  AFTER 5 PM or on weekends please call [phone] ID: [name], female   DOB: 1934-09-15, 83 y.o.   MRN: [id number]

## 2017-12-09 NOTE — Progress Notes (Signed)
Referral received to assist pt with [facility] hospice. Met with pt and daughter. Pt prefers to use [facility] ([facility]). Contacted [facility] at [phone] and left message with recepctionist. Received call from nurse who stated that she will notify Harmon Pier, liaison, about the referral. Received call from Harmon Pier and she will f/u.

## 2017-12-09 NOTE — Progress Notes (Signed)
Pharmacy Antibiotic Note  Kaimana Neuzil is a 83 y.o. female admitted on 12/05/2017 with intra-abdominal infection, per CT likely acute diverticulitis.  Pharmacy has been consulted for levaquin dosing.  Plan: Continue levofloxacin 750mg  IV q48h for CrCl ~14mls/min Flagyl 500mg  IV q8h per MD Follow renal function and clinical course  Height: 5\' 3"  (160 cm) Weight: 176 lb (79.8 kg) IBW/kg (Calculated) : 52.4  Temp (24hrs), Avg:98.7 F (37.1 C), Min:98.7 F (37.1 C), Max:98.7 F (37.1 C)  Recent Labs  Lab 12/05/17 1155 12/05/17 1711 12/06/17 0438 12/06/17 1909 12/07/17 0438  WBC 10.7*  --   --   --   --   CREATININE 1.00 0.99 0.97 0.86 0.84    Estimated Creatinine Clearance: 52.6 mL/min (by C-G formula based on SCr of 0.84 mg/dL).    Allergies  Allergen Reactions  . Cymbalta [Duloxetine Hcl] Anaphylaxis  . Oxycodone Other (See Comments)    Nightmares  . Ramipril Other (See Comments)    REACTION: difficulty breathing  . Hydrochlorothiazide Rash  . Telmisartan-Hctz Rash    Antimicrobials this admission: 12/27 levaquin >> 12/27 flagyl >>   Thank you for allowing pharmacy to be a part of this patient's care.  Royetta Asal, PharmD, BCPS Pager [phone] 12/09/2017 2:00 PM

## 2017-12-09 NOTE — Discharge Summary (Signed)
Physician Discharge Summary  [name] IWL:[id number] DOB: November 28, 1934 DOA: 12/05/2017  PCP: Marletta Lor, MD  Admit date: 12/05/2017 Discharge date: 12/09/2017  Time spent: 35* minutes  Recommendations for Outpatient Follow-up:  1. Follow up Dr Michail Sermon in 1 week 2. Follow up urology in 2 weeks   Discharge Diagnoses:  Principal Problem:   Hypokalemia Active Problems:   Essential hypertension   Aortic valve disorder   Pulmonary embolism (HCC)   Hypomagnesemia   Chronic diarrhea   Weakness generalized   Diarrhea   Diverticulitis   Discharge Condition: Stable  Diet recommendation: Regular diet  Filed Weights   12/07/17 0540 12/08/17 0512 12/09/17 0550  Weight: 77.9 kg (171 lb 11.8 oz) 79.5 kg (175 lb 4.3 oz) 79.8 kg (176 lb)    History of present illness:  83 year old female with history of chronic diarrhea for about 3-4 months which was not fully workup, prior stroke, hypertension, hyperlipidemia, pulmonary embolism on anticoagulation, GERD, type 2 diabetes, prior admission for hypokalemia likely in the setting of diarrhea presented with generalized weakness and persistent diarrhea. Patient and her daughter reported that they follow with PCP but has not seen GI.    Hospital Course:  1. Chronic diarrhea- CT abdomen pelvis showed distal colonic diverticulosis with suspected acute diverticulitis of the descending sigmoid junction. Patient is currently on Levaquin and Flagyl. Will discharge on Cipro 250 mg PO BID for three days, Flagyl 500 mg PO TID for three days also patient started on Imodium 2 mg TID. By G.I. Diarrhea has improved. 2. Pancreatic mass- MRI abdomen showed concern for pancreatic head mass worrisome for adenocarcinoma.  Ca 19-9, AFP is pending. G [facility] consulted and did not recommend endoscopic ultrasound and biopsy if patient doesn't want treatment. Tumor markers are still pending. Patient wants to go home. She will follow up with G.I. and one week  as outpatient. Once [place] cancer is confirmed patient can be referred for outpatient hospice. 3. Bladder mass- patient found to have my crematoria and bladder lesion. She was seen by urology, will follow urology as outpatient for cystoscopy in office in two weeks 4. History of pulmonary embolism-continue eliquis 5. Essential hypertension-continue Norvasc 6. New onset A. Fib- in setting electrolyte abnormalities. Echocardiogram showed moderate concentric hypertrophy EF 55%. Patient is currently on eliquis for anti-coagulation.     Procedures:  None   Consultations:  Urology  Gastroenterology   Discharge Exam: Vitals:   12/09/17 0550 12/09/17 1403  BP: (!) 160/93 (!) 164/85  Pulse: (!) 106 (!) 108  Resp: 18 16  Temp: 98.7 F (37.1 C) 98.3 F (36.8 C)  SpO2: 96% 99%      Discharge Instructions   Discharge Instructions    Diet - low sodium heart healthy   Complete by:  As directed    Increase activity slowly   Complete by:  As directed      Allergies as of 12/09/2017      Reactions   Cymbalta [duloxetine Hcl] Anaphylaxis   Oxycodone Other (See Comments)   Nightmares   Ramipril Other (See Comments)   REACTION: difficulty breathing   Hydrochlorothiazide Rash   Telmisartan-hctz Rash      Medication List    STOP taking these medications   acetaminophen 500 MG tablet Commonly known as:  TYLENOL     TAKE these medications   amLODipine 5 MG tablet Commonly known as:  NORVASC Take 0.5 tablets (2.5 mg total) daily by mouth.   apixaban 5 MG Tabs tablet Commonly  known as:  ELIQUIS Take 1 tablet (5 mg total) by mouth 2 (two) times daily.   atorvastatin 20 MG tablet Commonly known as:  LIPITOR TAKE 1 TABLET (20 MG TOTAL) BY MOUTH DAILY.   ciprofloxacin 250 MG tablet Commonly known as:  CIPRO Take 1 tablet (250 mg total) by mouth 2 (two) times daily for 3 days.   cyanocobalamin 1000 MCG tablet Take 1,000 mcg by mouth daily.   esomeprazole 40 MG  capsule Commonly known as:  NEXIUM Take 1 capsule (40 mg total) by mouth daily.   fludrocortisone 0.1 MG tablet Commonly known as:  FLORINEF Take 0.5 tablets (0.05 mg total) by mouth daily. What changed:  how much to take   glipiZIDE 2.5 MG 24 hr tablet Commonly known as:  GLUCOTROL XL TAKE 1 TABLET (2.5 MG TOTAL) BY MOUTH DAILY WITH BREAKFAST.   glucose blood test strip Use to check CBG TID. Dx: E11.9   HYDROcodone-acetaminophen 5-325 MG tablet Commonly known as:  NORCO/VICODIN Take 1-2 tablets by mouth every 6 (six) hours as needed for moderate pain.   imipramine 25 MG tablet Commonly known as:  TOFRANIL TAKE 1 TABLET BY MOUTH EVERY DAY What changed:    how much to take  how to take this  when to take this   Lancet Device Misc Use to check blood sugar three times a day before meals   loperamide 2 MG capsule Commonly known as:  IMODIUM Take 1 capsule (2 mg total) by mouth 3 (three) times daily.   magnesium oxide 400 MG tablet Commonly known as:  MAG-OX TAKE 1 TABLET (400 MG TOTAL) BY MOUTH 3 (THREE) TIMES DAILY WITH MEALS.   metFORMIN 500 MG tablet Commonly known as:  GLUCOPHAGE Take 1 tablet (500 mg total) daily with breakfast by mouth.   metroNIDAZOLE 500 MG tablet Commonly known as:  FLAGYL Take 1 tablet (500 mg total) by mouth 3 (three) times daily for 3 days.   ONE TOUCH ULTRA 2 w/Device Kit 1 Device 3 (three) times daily before meals by Does not apply route. Dx :E11.9   onetouch ultrasoft lancets Use to check CBG TID. Dx : E11.9      Allergies  Allergen Reactions  . Cymbalta [Duloxetine Hcl] Anaphylaxis  . Oxycodone Other (See Comments)    Nightmares  . Ramipril Other (See Comments)    REACTION: difficulty breathing  . Hydrochlorothiazide Rash  . Telmisartan-Hctz Rash   Follow-up Information    McKenzie, Candee Furbish, MD. Call in 2 week(s).   Specialty:  Urology Why:  cystoscopy Contact information: [place]  [address] [phone]        Wilford Corner, MD. Schedule an appointment as soon as possible for a visit in 1 week(s).   Specialty:  Gastroenterology Contact information: [address] [place] [address] [phone]            The results of significant diagnostics from this hospitalization (including imaging, microbiology, ancillary and laboratory) are listed below for reference.    Significant Diagnostic Studies: Ct Abdomen Pelvis W Contrast  Result Date: 12/06/2017 CLINICAL DATA:  Chronic diarrhea for 3-4 months, hypertension, hyperlipidemia, history of pulmonary embolism on anti coagulation, type II diabetes mellitus, GERD, generalized weakness, some LEFT lower quadrant tenderness EXAM: CT ABDOMEN AND PELVIS WITH CONTRAST TECHNIQUE: Multidetector CT imaging of the abdomen and pelvis was performed using the standard protocol following bolus administration of intravenous contrast. Sagittal and coronal MPR images reconstructed from axial  data set. CONTRAST:  128m ISOVUE-300 IOPAMIDOL (ISOVUE-300) INJECTION 61% COMPARISON:  None. FINDINGS: Lower chest: Tiny bibasilar pleural effusions and minimal atelectasis Hepatobiliary: Fatty infiltration of liver. Gallbladder unremarkable. Tiny cyst in caudate lobe liver. Pancreas: Atrophic pancreas with ductal dilatation. Hypoechoic region at pancreatic head which could represent a pancreatic neoplasm or less likely focal pancreatitis, area in question 20 x 21 x 17 mm in size. Spleen: Normal appearance Adrenals/Urinary Tract: Adrenal glands, kidneys, and ureters normal appearance. Small mass with tiny calcifications at the posterior RIGHT bladder base, question tumor, ureterocele with tiny calcifications, partially calcified stone, area in question 13 x 7 x 15 mm in size. Remainder of bladder unremarkable. Stomach/Bowel: Normal appendix. Diverticulosis of descending and sigmoid colon with pericolic infiltrative changes at the  descending sigmoid junction suspicious for acute diverticulitis. Additional portions of the colon demonstrate questionable wall thickening versus artifact from underdistention. Small bowel loops unremarkable. Stomach decompressed. Vascular/Lymphatic: Atherosclerotic calcifications aorta and iliac arteries without aneurysm. Scattered pelvic phleboliths. No adenopathy. Reproductive: Uterus surgically absent.  Normal sized ovaries. Other: No free air or free fluid.  No hernia. Musculoskeletal: Diffuse osseous demineralization scattered degenerative disc and facet disease changes of the thoracolumbar spine. IMPRESSION: Distal colonic diverticulosis with suspected acute diverticulitis of the descending sigmoid junction. Additional scattered areas of questionable colonic wall thickening versus artifact or distension complex not excluded. RIGHT-side bladder base intraluminal mass measuring 13 x 7 x 15 mm, question tumor, ureterocele, partially calcified cyst; correlation with cystoscopy recommended. Poor defined low-attenuation region at pancreatic head 20 x 21 x 17 mm in size, favor tumor though focal pancreatitis not excluded; followup MR imaging abdomen with and without contrast recommended to assess for potential pancreatic neoplasm. Hepatic steatosis. Electronically Signed   By: MLavonia DanaM.D.   On: 12/06/2017 15:29   Mr 3d Recon At Scanner  Result Date: 12/07/2017 CLINICAL DATA:  83year old inpatient with chronic diarrhea for 3 or 4 months. History of diabetes and pulmonary embolism on anticoagulation. Possible pancreatic head mass on CT. EXAM: MRI ABDOMEN WITHOUT AND WITH CONTRAST (INCLUDING MRCP) TECHNIQUE: Multiplanar multisequence MR imaging of the abdomen was performed both before and after the administration of intravenous contrast. Heavily T2-weighted images of the biliary and pancreatic ducts were obtained, and three-dimensional MRCP images were rendered by post processing. CONTRAST:  195mMULTIHANCE  GADOBENATE DIMEGLUMINE 529 MG/ML IV SOLN COMPARISON:  CT 12/06/2017. FINDINGS: Lower chest: Small pleural effusions bilaterally with mild atelectasis at both lung bases. The visualized lower chest otherwise appears unremarkable. Hepatobiliary: The liver demonstrates diffuse loss of signal on the gradient echo opposed phase images consistent with steatosis. Stable tiny cyst in the caudate lobe. No other focal hepatic lesions or suspicious enhancement demonstrated. The gallbladder is mildly distended without wall thickening, stones or surrounding inflammation. There is mild extrahepatic biliary dilatation with the common hepatic duct measuring up to 10 mm in diameter. No evidence of choledocholithiasis. The common bile duct appears to abruptly taper within the pancreatic head. Pancreas: The pancreas is atrophied with diffuse main and side duct dilatation, especially in the pancreatic tail. As demonstrated on CT, there is an area of decreased enhancement inferiorly in the pancreatic head, measuring approximately 18 x 14 mm transverse on image 68 of series 1103. This demonstrates a rim of enhancement on the early postcontrast images (series 1102). This remains suspicious for pancreatic adenocarcinoma in this patient without focal surrounding inflammation or an elevated serum lipase level. There are scattered small cysts throughout the pancreatic body and tail without  associated abnormal enhancement. Spleen: Normal in size without focal abnormality. Adrenals/Urinary Tract: Both adrenal glands appear normal. Both kidneys appear normal without evidence of mass or hydronephrosis. Bladder not imaged. Stomach/Bowel: The visualized stomach and small bowel appear normal. There is stable mild diffuse colonic wall thickening. No evidence of bowel obstruction or perforation. Vascular/Lymphatic: There are no enlarged abdominal lymph nodes. No acute vascular findings. No evidence of vascular encasement. Other: Anasarca with  generalized subcutaneous edema, greatest in the left flank. No focal extraluminal fluid collection. Musculoskeletal: No acute or significant osseous findings. IMPRESSION: 1. Persistent concern of pancreatic head mass, worrisome for adenocarcinoma. Associated diffuse pancreatic ductal and mild biliary ductal dilatation. Focal pancreatitis considered less likely given the absence of an elevated serum lipase level or surrounding inflammation. Endoscopic ultrasound for tissue sampling recommended. 2. Diffuse dilatation of the pancreatic duct with multiple small cystic lesions, suggesting previous pancreatitis. Underlying intraductal papillary mucinous tumor is possible. 3. No definite metastatic disease. 4. Anasarca with pleural effusions and generalized soft tissue edema. 5. Stable mild generalized colonic wall thickening. 6. Hepatic steatosis. Electronically Signed   By: Richardean Sale M.D.   On: 12/07/2017 10:25   Mr Abdomen Mrcp Moise Boring Contast  Result Date: 12/07/2017 CLINICAL DATA:  83 year old inpatient with chronic diarrhea for 3 or 4 months. History of diabetes and pulmonary embolism on anticoagulation. Possible pancreatic head mass on CT. EXAM: MRI ABDOMEN WITHOUT AND WITH CONTRAST (INCLUDING MRCP) TECHNIQUE: Multiplanar multisequence MR imaging of the abdomen was performed both before and after the administration of intravenous contrast. Heavily T2-weighted images of the biliary and pancreatic ducts were obtained, and three-dimensional MRCP images were rendered by post processing. CONTRAST:  19m MULTIHANCE GADOBENATE DIMEGLUMINE 529 MG/ML IV SOLN COMPARISON:  CT 12/06/2017. FINDINGS: Lower chest: Small pleural effusions bilaterally with mild atelectasis at both lung bases. The visualized lower chest otherwise appears unremarkable. Hepatobiliary: The liver demonstrates diffuse loss of signal on the gradient echo opposed phase images consistent with steatosis. Stable tiny cyst in the caudate lobe. No other  focal hepatic lesions or suspicious enhancement demonstrated. The gallbladder is mildly distended without wall thickening, stones or surrounding inflammation. There is mild extrahepatic biliary dilatation with the common hepatic duct measuring up to 10 mm in diameter. No evidence of choledocholithiasis. The common bile duct appears to abruptly taper within the pancreatic head. Pancreas: The pancreas is atrophied with diffuse main and side duct dilatation, especially in the pancreatic tail. As demonstrated on CT, there is an area of decreased enhancement inferiorly in the pancreatic head, measuring approximately 18 x 14 mm transverse on image 68 of series 1103. This demonstrates a rim of enhancement on the early postcontrast images (series 1102). This remains suspicious for pancreatic adenocarcinoma in this patient without focal surrounding inflammation or an elevated serum lipase level. There are scattered small cysts throughout the pancreatic body and tail without associated abnormal enhancement. Spleen: Normal in size without focal abnormality. Adrenals/Urinary Tract: Both adrenal glands appear normal. Both kidneys appear normal without evidence of mass or hydronephrosis. Bladder not imaged. Stomach/Bowel: The visualized stomach and small bowel appear normal. There is stable mild diffuse colonic wall thickening. No evidence of bowel obstruction or perforation. Vascular/Lymphatic: There are no enlarged abdominal lymph nodes. No acute vascular findings. No evidence of vascular encasement. Other: Anasarca with generalized subcutaneous edema, greatest in the left flank. No focal extraluminal fluid collection. Musculoskeletal: No acute or significant osseous findings. IMPRESSION: 1. Persistent concern of pancreatic head mass, worrisome for adenocarcinoma. Associated diffuse pancreatic  ductal and mild biliary ductal dilatation. Focal pancreatitis considered less likely given the absence of an elevated serum lipase level  or surrounding inflammation. Endoscopic ultrasound for tissue sampling recommended. 2. Diffuse dilatation of the pancreatic duct with multiple small cystic lesions, suggesting previous pancreatitis. Underlying intraductal papillary mucinous tumor is possible. 3. No definite metastatic disease. 4. Anasarca with pleural effusions and generalized soft tissue edema. 5. Stable mild generalized colonic wall thickening. 6. Hepatic steatosis. Electronically Signed   By: Richardean Sale M.D.   On: 12/07/2017 10:25    Microbiology: Recent Results (from the past 240 hour(s))  C difficile quick scan w PCR reflex     Status: None   Collection Time: 12/05/17 12:30 PM  Result Value Ref Range Status   C Diff antigen NEGATIVE NEGATIVE Final    Comment: NEGATIVE NEGATIVE    C Diff toxin NEGATIVE NEGATIVE Final   C Diff interpretation No C. difficile detected.  Final    Comment: NEGATIVE  Gastrointestinal Panel by PCR , Stool     Status: None   Collection Time: 12/05/17 12:30 PM  Result Value Ref Range Status   Campylobacter species NOT DETECTED NOT DETECTED Final   Plesimonas shigelloides NOT DETECTED NOT DETECTED Final   Salmonella species NOT DETECTED NOT DETECTED Final   Yersinia enterocolitica NOT DETECTED NOT DETECTED Final   Vibrio species NOT DETECTED NOT DETECTED Final   Vibrio cholerae NOT DETECTED NOT DETECTED Final   Enteroaggregative E coli (EAEC) NOT DETECTED NOT DETECTED Final   Enteropathogenic E coli (EPEC) NOT DETECTED NOT DETECTED Final   Enterotoxigenic E coli (ETEC) NOT DETECTED NOT DETECTED Final   Shiga like toxin producing E coli (STEC) NOT DETECTED NOT DETECTED Final   Shigella/Enteroinvasive E coli (EIEC) NOT DETECTED NOT DETECTED Final   Cryptosporidium NOT DETECTED NOT DETECTED Final   Cyclospora cayetanensis NOT DETECTED NOT DETECTED Final   Entamoeba histolytica NOT DETECTED NOT DETECTED Final   Giardia lamblia NOT DETECTED NOT DETECTED Final   Adenovirus F40/41 NOT  DETECTED NOT DETECTED Final   Astrovirus NOT DETECTED NOT DETECTED Final   Norovirus GI/GII NOT DETECTED NOT DETECTED Final   Rotavirus A NOT DETECTED NOT DETECTED Final   Sapovirus (I, II, IV, and V) NOT DETECTED NOT DETECTED Final    Comment: Performed at [facility], [address], [place], [place] [address]     Labs: Basic Metabolic Panel: Recent Labs  Lab 12/05/17 1155 12/05/17 1711 12/06/17 0438 12/06/17 1909 12/07/17 0438  NA 136 138 139 140 140  K 2.2* 2.6* 2.7* 3.3* 3.5  CL 100* 104 105 108 109  CO2 25 27 29 26 26   GLUCOSE 259* 190* 152* 145* 141*  BUN 12 13 11 9 8   CREATININE 1.00 0.99 0.97 0.86 0.84  CALCIUM 7.3* 7.0* 7.1* 7.3* 7.3*  MG 1.1* 1.8 1.5*  --  1.2*  PHOS  --   --  2.4*  --  1.7*   Liver Function Tests: Recent Labs  Lab 12/05/17 1155 12/07/17 0438 12/08/17 1507  AST 26  --  23  ALT 11*  --  11*  ALKPHOS 68  --  61  BILITOT 0.8  --  0.9  PROT 5.2*  --  4.7*  ALBUMIN 2.6* 2.2* 2.3*   Recent Labs  Lab 12/05/17 1155 12/08/17 1507  LIPASE 17 15   No results for input(s): AMMONIA in the last 168 hours. CBC: Recent Labs  Lab 12/05/17 1155  WBC 10.7*  HGB 10.9*  HCT 36.9  MCV 90.2  PLT 142*   Cardiac Enzymes: No results for input(s): CKTOTAL, CKMB, CKMBINDEX, TROPONINI in the last 168 hours. BNP: BNP (last 3 results) No results for input(s): BNP in the last 8760 hours.  ProBNP (last 3 results) No results for input(s): PROBNP in the last 8760 hours.  CBG: Recent Labs  Lab 12/08/17 0728 12/08/17 1149 12/08/17 1640 12/08/17 2145 12/09/17 0729  GLUCAP 136* 173* 160* 114* 120*       Signed:  Oswald Hillock MD.  [facility] 12/09/2017, 2:55 PM

## 2017-12-10 ENCOUNTER — Telehealth: Payer: Self-pay | Admitting: Internal Medicine

## 2017-12-10 ENCOUNTER — Telehealth: Payer: Self-pay | Admitting: *Deleted

## 2017-12-10 ENCOUNTER — Ambulatory Visit: Payer: Medicare Other | Admitting: Internal Medicine

## 2017-12-10 DIAGNOSIS — I2609 Other pulmonary embolism with acute cor pulmonale: Secondary | ICD-10-CM | POA: Diagnosis not present

## 2017-12-10 DIAGNOSIS — I509 Heart failure, unspecified: Secondary | ICD-10-CM | POA: Diagnosis not present

## 2017-12-10 DIAGNOSIS — R197 Diarrhea, unspecified: Secondary | ICD-10-CM | POA: Diagnosis not present

## 2017-12-10 DIAGNOSIS — I4891 Unspecified atrial fibrillation: Secondary | ICD-10-CM | POA: Diagnosis not present

## 2017-12-10 DIAGNOSIS — F339 Major depressive disorder, recurrent, unspecified: Secondary | ICD-10-CM | POA: Diagnosis not present

## 2017-12-10 DIAGNOSIS — E785 Hyperlipidemia, unspecified: Secondary | ICD-10-CM | POA: Diagnosis not present

## 2017-12-10 DIAGNOSIS — I10 Essential (primary) hypertension: Secondary | ICD-10-CM | POA: Diagnosis not present

## 2017-12-10 DIAGNOSIS — E1159 Type 2 diabetes mellitus with other circulatory complications: Secondary | ICD-10-CM | POA: Diagnosis not present

## 2017-12-10 DIAGNOSIS — I679 Cerebrovascular disease, unspecified: Secondary | ICD-10-CM | POA: Diagnosis not present

## 2017-12-10 DIAGNOSIS — K219 Gastro-esophageal reflux disease without esophagitis: Secondary | ICD-10-CM | POA: Diagnosis not present

## 2017-12-10 DIAGNOSIS — D419 Neoplasm of uncertain behavior of unspecified urinary organ: Secondary | ICD-10-CM | POA: Diagnosis not present

## 2017-12-10 DIAGNOSIS — C259 Malignant neoplasm of pancreas, unspecified: Secondary | ICD-10-CM | POA: Diagnosis not present

## 2017-12-10 DIAGNOSIS — Z86718 Personal history of other venous thrombosis and embolism: Secondary | ICD-10-CM | POA: Diagnosis not present

## 2017-12-10 DIAGNOSIS — I359 Nonrheumatic aortic valve disorder, unspecified: Secondary | ICD-10-CM | POA: Diagnosis not present

## 2017-12-10 NOTE — Telephone Encounter (Signed)
Copied from [place] [id number]. Topic: Quick Communication - See Telephone Encounter >> Dec 10, 2017  9:08 AM Arletha Grippe wrote: CRM for notification. See Telephone encounter for:   12/10/17. Pt was admitted to the hospital and released over the weekend.  She was referred to hospice care, they would like for Dr Raliegh Ip to be attending physician. Also pt had fall this morning and a nurse would like to come out this morning.  Amber from hospice would like to have call back asap.   Cb # [phone]

## 2017-12-10 NOTE — Telephone Encounter (Signed)
All okay 

## 2017-12-10 NOTE — Telephone Encounter (Signed)
Amber notified of below. They will try to get patient admitted to Hospice this morning. No further needs identified at this time.

## 2017-12-10 NOTE — Telephone Encounter (Signed)
Unable to reach patient at time of TCM Call. Left message for patient to return call when available.  

## 2017-12-10 NOTE — Telephone Encounter (Signed)
Ok to remain PCP and give verbal orders for nursing care for fall?

## 2017-12-11 DIAGNOSIS — K219 Gastro-esophageal reflux disease without esophagitis: Secondary | ICD-10-CM | POA: Diagnosis not present

## 2017-12-11 DIAGNOSIS — C259 Malignant neoplasm of pancreas, unspecified: Secondary | ICD-10-CM | POA: Diagnosis not present

## 2017-12-11 DIAGNOSIS — D419 Neoplasm of uncertain behavior of unspecified urinary organ: Secondary | ICD-10-CM | POA: Diagnosis not present

## 2017-12-11 DIAGNOSIS — I679 Cerebrovascular disease, unspecified: Secondary | ICD-10-CM | POA: Diagnosis not present

## 2017-12-11 DIAGNOSIS — Z86718 Personal history of other venous thrombosis and embolism: Secondary | ICD-10-CM | POA: Diagnosis not present

## 2017-12-11 DIAGNOSIS — R197 Diarrhea, unspecified: Secondary | ICD-10-CM | POA: Diagnosis not present

## 2017-12-11 DIAGNOSIS — I4891 Unspecified atrial fibrillation: Secondary | ICD-10-CM | POA: Diagnosis not present

## 2017-12-11 DIAGNOSIS — E785 Hyperlipidemia, unspecified: Secondary | ICD-10-CM | POA: Diagnosis not present

## 2017-12-11 DIAGNOSIS — I509 Heart failure, unspecified: Secondary | ICD-10-CM | POA: Diagnosis not present

## 2017-12-11 DIAGNOSIS — E1159 Type 2 diabetes mellitus with other circulatory complications: Secondary | ICD-10-CM | POA: Diagnosis not present

## 2017-12-11 DIAGNOSIS — I10 Essential (primary) hypertension: Secondary | ICD-10-CM | POA: Diagnosis not present

## 2017-12-11 DIAGNOSIS — F339 Major depressive disorder, recurrent, unspecified: Secondary | ICD-10-CM | POA: Diagnosis not present

## 2017-12-11 DIAGNOSIS — I2609 Other pulmonary embolism with acute cor pulmonale: Secondary | ICD-10-CM | POA: Diagnosis not present

## 2017-12-11 DIAGNOSIS — I359 Nonrheumatic aortic valve disorder, unspecified: Secondary | ICD-10-CM | POA: Diagnosis not present

## 2017-12-12 ENCOUNTER — Telehealth: Payer: Self-pay | Admitting: Family Medicine

## 2017-12-12 DIAGNOSIS — I4891 Unspecified atrial fibrillation: Secondary | ICD-10-CM | POA: Diagnosis not present

## 2017-12-12 DIAGNOSIS — I359 Nonrheumatic aortic valve disorder, unspecified: Secondary | ICD-10-CM | POA: Diagnosis not present

## 2017-12-12 DIAGNOSIS — I679 Cerebrovascular disease, unspecified: Secondary | ICD-10-CM | POA: Diagnosis not present

## 2017-12-12 DIAGNOSIS — Z86718 Personal history of other venous thrombosis and embolism: Secondary | ICD-10-CM | POA: Diagnosis not present

## 2017-12-12 DIAGNOSIS — C259 Malignant neoplasm of pancreas, unspecified: Secondary | ICD-10-CM | POA: Diagnosis not present

## 2017-12-12 DIAGNOSIS — I509 Heart failure, unspecified: Secondary | ICD-10-CM | POA: Diagnosis not present

## 2017-12-12 LAB — GLUCOSE, CAPILLARY: Glucose-Capillary: 219 mg/dL — ABNORMAL HIGH (ref 65–99)

## 2017-12-12 NOTE — Telephone Encounter (Signed)
Hard copy mailed to pts home address.

## 2017-12-12 NOTE — Telephone Encounter (Signed)
Patient recently hospitalized and is now a hospice patient.

## 2017-12-12 NOTE — Telephone Encounter (Signed)
Copied from [place] [phone]. Topic: Inquiry >> Dec 10, 2017  2:05 PM Scherrie Gerlach wrote: Reason for CRM: nicole with hospice states pt was admitted to hospice today and family is requesting a DNR. Elmyra Ricks requesting verbal for this order, and they will take out to the family for signature. Call hospice main number and anyone can take the verbal. [phone].

## 2017-12-12 NOTE — Telephone Encounter (Signed)
Spoke w/ patient daughter, Trecia Rogers. Patient is currently a hospice patient and is now homebound. They have no needs at this time and will have hospice contact the office as needs arise.

## 2017-12-12 NOTE — Telephone Encounter (Signed)
Dr K patient

## 2017-12-12 NOTE — Telephone Encounter (Signed)
Verbal orders given per Dr Raliegh Ip.  Hard copy available if needed.

## 2017-12-14 DIAGNOSIS — Z86718 Personal history of other venous thrombosis and embolism: Secondary | ICD-10-CM | POA: Diagnosis not present

## 2017-12-14 DIAGNOSIS — I4891 Unspecified atrial fibrillation: Secondary | ICD-10-CM | POA: Diagnosis not present

## 2017-12-14 DIAGNOSIS — I509 Heart failure, unspecified: Secondary | ICD-10-CM | POA: Diagnosis not present

## 2017-12-14 DIAGNOSIS — I679 Cerebrovascular disease, unspecified: Secondary | ICD-10-CM | POA: Diagnosis not present

## 2017-12-14 DIAGNOSIS — C259 Malignant neoplasm of pancreas, unspecified: Secondary | ICD-10-CM | POA: Diagnosis not present

## 2017-12-14 DIAGNOSIS — I359 Nonrheumatic aortic valve disorder, unspecified: Secondary | ICD-10-CM | POA: Diagnosis not present

## 2017-12-16 ENCOUNTER — Other Ambulatory Visit: Payer: Self-pay | Admitting: Internal Medicine

## 2017-12-17 DIAGNOSIS — I509 Heart failure, unspecified: Secondary | ICD-10-CM | POA: Diagnosis not present

## 2017-12-17 DIAGNOSIS — I4891 Unspecified atrial fibrillation: Secondary | ICD-10-CM | POA: Diagnosis not present

## 2017-12-17 DIAGNOSIS — I359 Nonrheumatic aortic valve disorder, unspecified: Secondary | ICD-10-CM | POA: Diagnosis not present

## 2017-12-17 DIAGNOSIS — Z86718 Personal history of other venous thrombosis and embolism: Secondary | ICD-10-CM | POA: Diagnosis not present

## 2017-12-17 DIAGNOSIS — I679 Cerebrovascular disease, unspecified: Secondary | ICD-10-CM | POA: Diagnosis not present

## 2017-12-17 DIAGNOSIS — C259 Malignant neoplasm of pancreas, unspecified: Secondary | ICD-10-CM | POA: Diagnosis not present

## 2017-12-18 DIAGNOSIS — I679 Cerebrovascular disease, unspecified: Secondary | ICD-10-CM | POA: Diagnosis not present

## 2017-12-18 DIAGNOSIS — I359 Nonrheumatic aortic valve disorder, unspecified: Secondary | ICD-10-CM | POA: Diagnosis not present

## 2017-12-18 DIAGNOSIS — I4891 Unspecified atrial fibrillation: Secondary | ICD-10-CM | POA: Diagnosis not present

## 2017-12-18 DIAGNOSIS — Z86718 Personal history of other venous thrombosis and embolism: Secondary | ICD-10-CM | POA: Diagnosis not present

## 2017-12-18 DIAGNOSIS — C259 Malignant neoplasm of pancreas, unspecified: Secondary | ICD-10-CM | POA: Diagnosis not present

## 2017-12-18 DIAGNOSIS — I509 Heart failure, unspecified: Secondary | ICD-10-CM | POA: Diagnosis not present

## 2017-12-20 DIAGNOSIS — C259 Malignant neoplasm of pancreas, unspecified: Secondary | ICD-10-CM | POA: Diagnosis not present

## 2017-12-20 DIAGNOSIS — I679 Cerebrovascular disease, unspecified: Secondary | ICD-10-CM | POA: Diagnosis not present

## 2017-12-20 DIAGNOSIS — I359 Nonrheumatic aortic valve disorder, unspecified: Secondary | ICD-10-CM | POA: Diagnosis not present

## 2017-12-20 DIAGNOSIS — I4891 Unspecified atrial fibrillation: Secondary | ICD-10-CM | POA: Diagnosis not present

## 2017-12-20 DIAGNOSIS — Z86718 Personal history of other venous thrombosis and embolism: Secondary | ICD-10-CM | POA: Diagnosis not present

## 2017-12-20 DIAGNOSIS — I509 Heart failure, unspecified: Secondary | ICD-10-CM | POA: Diagnosis not present

## 2017-12-21 DIAGNOSIS — C259 Malignant neoplasm of pancreas, unspecified: Secondary | ICD-10-CM | POA: Diagnosis not present

## 2017-12-21 DIAGNOSIS — I4891 Unspecified atrial fibrillation: Secondary | ICD-10-CM | POA: Diagnosis not present

## 2017-12-21 DIAGNOSIS — Z86718 Personal history of other venous thrombosis and embolism: Secondary | ICD-10-CM | POA: Diagnosis not present

## 2017-12-21 DIAGNOSIS — I509 Heart failure, unspecified: Secondary | ICD-10-CM | POA: Diagnosis not present

## 2017-12-21 DIAGNOSIS — I359 Nonrheumatic aortic valve disorder, unspecified: Secondary | ICD-10-CM | POA: Diagnosis not present

## 2017-12-21 DIAGNOSIS — I679 Cerebrovascular disease, unspecified: Secondary | ICD-10-CM | POA: Diagnosis not present

## 2017-12-24 DIAGNOSIS — I509 Heart failure, unspecified: Secondary | ICD-10-CM | POA: Diagnosis not present

## 2017-12-24 DIAGNOSIS — I679 Cerebrovascular disease, unspecified: Secondary | ICD-10-CM | POA: Diagnosis not present

## 2017-12-24 DIAGNOSIS — Z86718 Personal history of other venous thrombosis and embolism: Secondary | ICD-10-CM | POA: Diagnosis not present

## 2017-12-24 DIAGNOSIS — C259 Malignant neoplasm of pancreas, unspecified: Secondary | ICD-10-CM | POA: Diagnosis not present

## 2017-12-24 DIAGNOSIS — I4891 Unspecified atrial fibrillation: Secondary | ICD-10-CM | POA: Diagnosis not present

## 2017-12-24 DIAGNOSIS — I359 Nonrheumatic aortic valve disorder, unspecified: Secondary | ICD-10-CM | POA: Diagnosis not present

## 2017-12-25 DIAGNOSIS — I359 Nonrheumatic aortic valve disorder, unspecified: Secondary | ICD-10-CM | POA: Diagnosis not present

## 2017-12-25 DIAGNOSIS — I679 Cerebrovascular disease, unspecified: Secondary | ICD-10-CM | POA: Diagnosis not present

## 2017-12-25 DIAGNOSIS — C259 Malignant neoplasm of pancreas, unspecified: Secondary | ICD-10-CM | POA: Diagnosis not present

## 2017-12-25 DIAGNOSIS — Z86718 Personal history of other venous thrombosis and embolism: Secondary | ICD-10-CM | POA: Diagnosis not present

## 2017-12-25 DIAGNOSIS — I4891 Unspecified atrial fibrillation: Secondary | ICD-10-CM | POA: Diagnosis not present

## 2017-12-25 DIAGNOSIS — I509 Heart failure, unspecified: Secondary | ICD-10-CM | POA: Diagnosis not present

## 2017-12-26 ENCOUNTER — Encounter: Payer: Self-pay | Admitting: Internal Medicine

## 2017-12-27 DIAGNOSIS — C259 Malignant neoplasm of pancreas, unspecified: Secondary | ICD-10-CM | POA: Diagnosis not present

## 2017-12-27 DIAGNOSIS — I509 Heart failure, unspecified: Secondary | ICD-10-CM | POA: Diagnosis not present

## 2017-12-27 DIAGNOSIS — Z86718 Personal history of other venous thrombosis and embolism: Secondary | ICD-10-CM | POA: Diagnosis not present

## 2017-12-27 DIAGNOSIS — I359 Nonrheumatic aortic valve disorder, unspecified: Secondary | ICD-10-CM | POA: Diagnosis not present

## 2017-12-27 DIAGNOSIS — I679 Cerebrovascular disease, unspecified: Secondary | ICD-10-CM | POA: Diagnosis not present

## 2017-12-27 DIAGNOSIS — I4891 Unspecified atrial fibrillation: Secondary | ICD-10-CM | POA: Diagnosis not present

## 2017-12-28 DIAGNOSIS — I4891 Unspecified atrial fibrillation: Secondary | ICD-10-CM | POA: Diagnosis not present

## 2017-12-28 DIAGNOSIS — Z86718 Personal history of other venous thrombosis and embolism: Secondary | ICD-10-CM | POA: Diagnosis not present

## 2017-12-28 DIAGNOSIS — I679 Cerebrovascular disease, unspecified: Secondary | ICD-10-CM | POA: Diagnosis not present

## 2017-12-28 DIAGNOSIS — I509 Heart failure, unspecified: Secondary | ICD-10-CM | POA: Diagnosis not present

## 2017-12-28 DIAGNOSIS — C259 Malignant neoplasm of pancreas, unspecified: Secondary | ICD-10-CM | POA: Diagnosis not present

## 2017-12-28 DIAGNOSIS — I359 Nonrheumatic aortic valve disorder, unspecified: Secondary | ICD-10-CM | POA: Diagnosis not present

## 2017-12-31 DIAGNOSIS — I359 Nonrheumatic aortic valve disorder, unspecified: Secondary | ICD-10-CM | POA: Diagnosis not present

## 2017-12-31 DIAGNOSIS — Z86718 Personal history of other venous thrombosis and embolism: Secondary | ICD-10-CM | POA: Diagnosis not present

## 2017-12-31 DIAGNOSIS — C259 Malignant neoplasm of pancreas, unspecified: Secondary | ICD-10-CM | POA: Diagnosis not present

## 2017-12-31 DIAGNOSIS — I4891 Unspecified atrial fibrillation: Secondary | ICD-10-CM | POA: Diagnosis not present

## 2017-12-31 DIAGNOSIS — I679 Cerebrovascular disease, unspecified: Secondary | ICD-10-CM | POA: Diagnosis not present

## 2017-12-31 DIAGNOSIS — I509 Heart failure, unspecified: Secondary | ICD-10-CM | POA: Diagnosis not present

## 2018-01-03 ENCOUNTER — Telehealth: Payer: Self-pay | Admitting: Internal Medicine

## 2018-01-03 DIAGNOSIS — I359 Nonrheumatic aortic valve disorder, unspecified: Secondary | ICD-10-CM | POA: Diagnosis not present

## 2018-01-03 DIAGNOSIS — I509 Heart failure, unspecified: Secondary | ICD-10-CM | POA: Diagnosis not present

## 2018-01-03 DIAGNOSIS — I679 Cerebrovascular disease, unspecified: Secondary | ICD-10-CM | POA: Diagnosis not present

## 2018-01-03 DIAGNOSIS — C259 Malignant neoplasm of pancreas, unspecified: Secondary | ICD-10-CM | POA: Diagnosis not present

## 2018-01-03 DIAGNOSIS — I4891 Unspecified atrial fibrillation: Secondary | ICD-10-CM | POA: Diagnosis not present

## 2018-01-03 DIAGNOSIS — Z86718 Personal history of other venous thrombosis and embolism: Secondary | ICD-10-CM | POA: Diagnosis not present

## 2018-01-03 NOTE — Telephone Encounter (Signed)
Copied from [place] [id number]. Topic: Quick Communication - Rx Refill/Question >> Jan 03, 2018  4:26 PM Patrice Paradise wrote: Medication: magnesium oxide (MAG-OX) 400 MG tablet   Has the patient contacted their pharmacy? Yes.     (Agent: If no, request that the patient contact the pharmacy for the refill.)   Preferred Pharmacy (with phone number or street name):   [id number] - [place], [place] [address] Phone: [phone] Fax: [phone]    Agent: Please be advised that RX refills may take up to 3 business days. We ask that you follow-up with your pharmacy.

## 2018-01-03 NOTE — Telephone Encounter (Signed)
Patient requesting Mag Ox 400 mg refill, last OV 11/06/17, last filled 12/17/17 21 tabs/0 refills.

## 2018-01-06 ENCOUNTER — Other Ambulatory Visit: Payer: Self-pay | Admitting: Internal Medicine

## 2018-01-07 DIAGNOSIS — I4891 Unspecified atrial fibrillation: Secondary | ICD-10-CM | POA: Diagnosis not present

## 2018-01-07 DIAGNOSIS — Z86718 Personal history of other venous thrombosis and embolism: Secondary | ICD-10-CM | POA: Diagnosis not present

## 2018-01-07 DIAGNOSIS — I679 Cerebrovascular disease, unspecified: Secondary | ICD-10-CM | POA: Diagnosis not present

## 2018-01-07 DIAGNOSIS — I359 Nonrheumatic aortic valve disorder, unspecified: Secondary | ICD-10-CM | POA: Diagnosis not present

## 2018-01-07 DIAGNOSIS — I509 Heart failure, unspecified: Secondary | ICD-10-CM | POA: Diagnosis not present

## 2018-01-07 DIAGNOSIS — C259 Malignant neoplasm of pancreas, unspecified: Secondary | ICD-10-CM | POA: Diagnosis not present

## 2018-01-09 DIAGNOSIS — I4891 Unspecified atrial fibrillation: Secondary | ICD-10-CM | POA: Diagnosis not present

## 2018-01-09 DIAGNOSIS — I679 Cerebrovascular disease, unspecified: Secondary | ICD-10-CM | POA: Diagnosis not present

## 2018-01-09 DIAGNOSIS — I509 Heart failure, unspecified: Secondary | ICD-10-CM | POA: Diagnosis not present

## 2018-01-09 DIAGNOSIS — Z86718 Personal history of other venous thrombosis and embolism: Secondary | ICD-10-CM | POA: Diagnosis not present

## 2018-01-09 DIAGNOSIS — I359 Nonrheumatic aortic valve disorder, unspecified: Secondary | ICD-10-CM | POA: Diagnosis not present

## 2018-01-09 DIAGNOSIS — C259 Malignant neoplasm of pancreas, unspecified: Secondary | ICD-10-CM | POA: Diagnosis not present

## 2018-01-10 DIAGNOSIS — I679 Cerebrovascular disease, unspecified: Secondary | ICD-10-CM | POA: Diagnosis not present

## 2018-01-10 DIAGNOSIS — C259 Malignant neoplasm of pancreas, unspecified: Secondary | ICD-10-CM | POA: Diagnosis not present

## 2018-01-10 DIAGNOSIS — I359 Nonrheumatic aortic valve disorder, unspecified: Secondary | ICD-10-CM | POA: Diagnosis not present

## 2018-01-10 DIAGNOSIS — I509 Heart failure, unspecified: Secondary | ICD-10-CM | POA: Diagnosis not present

## 2018-01-10 DIAGNOSIS — Z86718 Personal history of other venous thrombosis and embolism: Secondary | ICD-10-CM | POA: Diagnosis not present

## 2018-01-10 DIAGNOSIS — I4891 Unspecified atrial fibrillation: Secondary | ICD-10-CM | POA: Diagnosis not present

## 2018-01-11 ENCOUNTER — Other Ambulatory Visit: Payer: Self-pay | Admitting: Internal Medicine

## 2018-01-11 DIAGNOSIS — I359 Nonrheumatic aortic valve disorder, unspecified: Secondary | ICD-10-CM | POA: Diagnosis not present

## 2018-01-11 DIAGNOSIS — I2609 Other pulmonary embolism with acute cor pulmonale: Secondary | ICD-10-CM | POA: Diagnosis not present

## 2018-01-11 DIAGNOSIS — C259 Malignant neoplasm of pancreas, unspecified: Secondary | ICD-10-CM | POA: Diagnosis not present

## 2018-01-11 DIAGNOSIS — I10 Essential (primary) hypertension: Secondary | ICD-10-CM | POA: Diagnosis not present

## 2018-01-11 DIAGNOSIS — I509 Heart failure, unspecified: Secondary | ICD-10-CM | POA: Diagnosis not present

## 2018-01-11 DIAGNOSIS — I4891 Unspecified atrial fibrillation: Secondary | ICD-10-CM | POA: Diagnosis not present

## 2018-01-11 DIAGNOSIS — F339 Major depressive disorder, recurrent, unspecified: Secondary | ICD-10-CM | POA: Diagnosis not present

## 2018-01-11 DIAGNOSIS — E785 Hyperlipidemia, unspecified: Secondary | ICD-10-CM | POA: Diagnosis not present

## 2018-01-11 DIAGNOSIS — K219 Gastro-esophageal reflux disease without esophagitis: Secondary | ICD-10-CM | POA: Diagnosis not present

## 2018-01-11 DIAGNOSIS — R197 Diarrhea, unspecified: Secondary | ICD-10-CM | POA: Diagnosis not present

## 2018-01-11 DIAGNOSIS — E1159 Type 2 diabetes mellitus with other circulatory complications: Secondary | ICD-10-CM | POA: Diagnosis not present

## 2018-01-11 DIAGNOSIS — Z86718 Personal history of other venous thrombosis and embolism: Secondary | ICD-10-CM | POA: Diagnosis not present

## 2018-01-11 DIAGNOSIS — D419 Neoplasm of uncertain behavior of unspecified urinary organ: Secondary | ICD-10-CM | POA: Diagnosis not present

## 2018-01-11 DIAGNOSIS — I679 Cerebrovascular disease, unspecified: Secondary | ICD-10-CM | POA: Diagnosis not present

## 2018-01-11 MED ORDER — MAGNESIUM OXIDE 400 MG PO TABS: ORAL_TABLET | ORAL | 3 refills | Status: AC

## 2018-01-11 NOTE — Telephone Encounter (Signed)
Rx was sent in for 7 day supply. Please advise.   Copied from [place] [phone]. Topic: Quick Communication - Rx Refill/Question >> Jan 03, 2018  4:26 PM Patrice Paradise wrote: Medication: magnesium oxide (MAG-OX) 400 MG tablet   Has the patient contacted their pharmacy? Yes.     (Agent: If no, request that the patient contact the pharmacy for the refill.)   Preferred Pharmacy (with phone number or street name):   [id number] - [place], [place] [address] Phone: [phone] Fax: [phone]    Agent: Please be advised that RX refills may take up to 3 business days. We ask that you follow-up with your pharmacy. >> Jan 11, 2018 10:53 AM Percell Belt A wrote: This was only sent in for 7 day supply?  Chong Sicilian called in from [facility] [phone] She would like to know if Dr plans on taking pt off this med or should it have been called for 30 days?

## 2018-01-11 NOTE — Telephone Encounter (Signed)
Rx was e-scribed to the pharmacy #90 tabs with 3 refills.

## 2018-01-15 DIAGNOSIS — I4891 Unspecified atrial fibrillation: Secondary | ICD-10-CM | POA: Diagnosis not present

## 2018-01-15 DIAGNOSIS — I359 Nonrheumatic aortic valve disorder, unspecified: Secondary | ICD-10-CM | POA: Diagnosis not present

## 2018-01-15 DIAGNOSIS — I509 Heart failure, unspecified: Secondary | ICD-10-CM | POA: Diagnosis not present

## 2018-01-15 DIAGNOSIS — Z86718 Personal history of other venous thrombosis and embolism: Secondary | ICD-10-CM | POA: Diagnosis not present

## 2018-01-15 DIAGNOSIS — C259 Malignant neoplasm of pancreas, unspecified: Secondary | ICD-10-CM | POA: Diagnosis not present

## 2018-01-15 DIAGNOSIS — I679 Cerebrovascular disease, unspecified: Secondary | ICD-10-CM | POA: Diagnosis not present

## 2018-01-16 DIAGNOSIS — Z86718 Personal history of other venous thrombosis and embolism: Secondary | ICD-10-CM | POA: Diagnosis not present

## 2018-01-16 DIAGNOSIS — I359 Nonrheumatic aortic valve disorder, unspecified: Secondary | ICD-10-CM | POA: Diagnosis not present

## 2018-01-16 DIAGNOSIS — I4891 Unspecified atrial fibrillation: Secondary | ICD-10-CM | POA: Diagnosis not present

## 2018-01-16 DIAGNOSIS — I509 Heart failure, unspecified: Secondary | ICD-10-CM | POA: Diagnosis not present

## 2018-01-16 DIAGNOSIS — C259 Malignant neoplasm of pancreas, unspecified: Secondary | ICD-10-CM | POA: Diagnosis not present

## 2018-01-16 DIAGNOSIS — I679 Cerebrovascular disease, unspecified: Secondary | ICD-10-CM | POA: Diagnosis not present

## 2018-01-17 DIAGNOSIS — C259 Malignant neoplasm of pancreas, unspecified: Secondary | ICD-10-CM | POA: Diagnosis not present

## 2018-01-17 DIAGNOSIS — I509 Heart failure, unspecified: Secondary | ICD-10-CM | POA: Diagnosis not present

## 2018-01-17 DIAGNOSIS — I359 Nonrheumatic aortic valve disorder, unspecified: Secondary | ICD-10-CM | POA: Diagnosis not present

## 2018-01-17 DIAGNOSIS — I679 Cerebrovascular disease, unspecified: Secondary | ICD-10-CM | POA: Diagnosis not present

## 2018-01-17 DIAGNOSIS — Z86718 Personal history of other venous thrombosis and embolism: Secondary | ICD-10-CM | POA: Diagnosis not present

## 2018-01-17 DIAGNOSIS — I4891 Unspecified atrial fibrillation: Secondary | ICD-10-CM | POA: Diagnosis not present

## 2018-01-18 DIAGNOSIS — I359 Nonrheumatic aortic valve disorder, unspecified: Secondary | ICD-10-CM | POA: Diagnosis not present

## 2018-01-18 DIAGNOSIS — Z86718 Personal history of other venous thrombosis and embolism: Secondary | ICD-10-CM | POA: Diagnosis not present

## 2018-01-18 DIAGNOSIS — I509 Heart failure, unspecified: Secondary | ICD-10-CM | POA: Diagnosis not present

## 2018-01-18 DIAGNOSIS — I4891 Unspecified atrial fibrillation: Secondary | ICD-10-CM | POA: Diagnosis not present

## 2018-01-18 DIAGNOSIS — C259 Malignant neoplasm of pancreas, unspecified: Secondary | ICD-10-CM | POA: Diagnosis not present

## 2018-01-18 DIAGNOSIS — I679 Cerebrovascular disease, unspecified: Secondary | ICD-10-CM | POA: Diagnosis not present

## 2018-01-21 DIAGNOSIS — I359 Nonrheumatic aortic valve disorder, unspecified: Secondary | ICD-10-CM | POA: Diagnosis not present

## 2018-01-21 DIAGNOSIS — Z86718 Personal history of other venous thrombosis and embolism: Secondary | ICD-10-CM | POA: Diagnosis not present

## 2018-01-21 DIAGNOSIS — I509 Heart failure, unspecified: Secondary | ICD-10-CM | POA: Diagnosis not present

## 2018-01-21 DIAGNOSIS — I4891 Unspecified atrial fibrillation: Secondary | ICD-10-CM | POA: Diagnosis not present

## 2018-01-21 DIAGNOSIS — I679 Cerebrovascular disease, unspecified: Secondary | ICD-10-CM | POA: Diagnosis not present

## 2018-01-21 DIAGNOSIS — C259 Malignant neoplasm of pancreas, unspecified: Secondary | ICD-10-CM | POA: Diagnosis not present

## 2018-01-24 DIAGNOSIS — I509 Heart failure, unspecified: Secondary | ICD-10-CM | POA: Diagnosis not present

## 2018-01-24 DIAGNOSIS — I359 Nonrheumatic aortic valve disorder, unspecified: Secondary | ICD-10-CM | POA: Diagnosis not present

## 2018-01-24 DIAGNOSIS — Z86718 Personal history of other venous thrombosis and embolism: Secondary | ICD-10-CM | POA: Diagnosis not present

## 2018-01-24 DIAGNOSIS — C259 Malignant neoplasm of pancreas, unspecified: Secondary | ICD-10-CM | POA: Diagnosis not present

## 2018-01-24 DIAGNOSIS — I679 Cerebrovascular disease, unspecified: Secondary | ICD-10-CM | POA: Diagnosis not present

## 2018-01-24 DIAGNOSIS — I4891 Unspecified atrial fibrillation: Secondary | ICD-10-CM | POA: Diagnosis not present

## 2018-01-25 DIAGNOSIS — I359 Nonrheumatic aortic valve disorder, unspecified: Secondary | ICD-10-CM | POA: Diagnosis not present

## 2018-01-25 DIAGNOSIS — I4891 Unspecified atrial fibrillation: Secondary | ICD-10-CM | POA: Diagnosis not present

## 2018-01-25 DIAGNOSIS — C259 Malignant neoplasm of pancreas, unspecified: Secondary | ICD-10-CM | POA: Diagnosis not present

## 2018-01-25 DIAGNOSIS — I679 Cerebrovascular disease, unspecified: Secondary | ICD-10-CM | POA: Diagnosis not present

## 2018-01-25 DIAGNOSIS — Z86718 Personal history of other venous thrombosis and embolism: Secondary | ICD-10-CM | POA: Diagnosis not present

## 2018-01-25 DIAGNOSIS — I509 Heart failure, unspecified: Secondary | ICD-10-CM | POA: Diagnosis not present

## 2018-01-28 ENCOUNTER — Telehealth: Payer: Self-pay | Admitting: Family Medicine

## 2018-01-28 DIAGNOSIS — I4891 Unspecified atrial fibrillation: Secondary | ICD-10-CM | POA: Diagnosis not present

## 2018-01-28 DIAGNOSIS — I679 Cerebrovascular disease, unspecified: Secondary | ICD-10-CM | POA: Diagnosis not present

## 2018-01-28 DIAGNOSIS — I359 Nonrheumatic aortic valve disorder, unspecified: Secondary | ICD-10-CM | POA: Diagnosis not present

## 2018-01-28 DIAGNOSIS — I509 Heart failure, unspecified: Secondary | ICD-10-CM | POA: Diagnosis not present

## 2018-01-28 DIAGNOSIS — Z86718 Personal history of other venous thrombosis and embolism: Secondary | ICD-10-CM | POA: Diagnosis not present

## 2018-01-28 DIAGNOSIS — C259 Malignant neoplasm of pancreas, unspecified: Secondary | ICD-10-CM | POA: Diagnosis not present

## 2018-01-28 NOTE — Telephone Encounter (Signed)
Copied from [place] [id number]. Topic: General - Other >> Jan 28, 2018 11:05 AM [name] wrote: Reason for CRM: FYI - pt seems to be turning a corner and transitioning to end of life - pt is much weaker - pt had an episode last Thursday that looked like a seizure - her eyes rolled in her head - no signs of stroke but was disoriented - pt has recovered some and is more oriented now - pt is not taking metformin as she is barely eating - last sugar level was 153 - pt is off statin and B12 due to trouble swallowing - Hospice has increased frequency of visits and pt wants to transfer to [facility] when in the last 2 wks of life

## 2018-01-30 DIAGNOSIS — I359 Nonrheumatic aortic valve disorder, unspecified: Secondary | ICD-10-CM | POA: Diagnosis not present

## 2018-01-30 DIAGNOSIS — I679 Cerebrovascular disease, unspecified: Secondary | ICD-10-CM | POA: Diagnosis not present

## 2018-01-30 DIAGNOSIS — Z86718 Personal history of other venous thrombosis and embolism: Secondary | ICD-10-CM | POA: Diagnosis not present

## 2018-01-30 DIAGNOSIS — I509 Heart failure, unspecified: Secondary | ICD-10-CM | POA: Diagnosis not present

## 2018-01-30 DIAGNOSIS — C259 Malignant neoplasm of pancreas, unspecified: Secondary | ICD-10-CM | POA: Diagnosis not present

## 2018-01-30 DIAGNOSIS — I4891 Unspecified atrial fibrillation: Secondary | ICD-10-CM | POA: Diagnosis not present

## 2018-01-30 NOTE — Telephone Encounter (Signed)
FYI

## 2018-01-31 DIAGNOSIS — I509 Heart failure, unspecified: Secondary | ICD-10-CM | POA: Diagnosis not present

## 2018-01-31 DIAGNOSIS — I4891 Unspecified atrial fibrillation: Secondary | ICD-10-CM | POA: Diagnosis not present

## 2018-01-31 DIAGNOSIS — I679 Cerebrovascular disease, unspecified: Secondary | ICD-10-CM | POA: Diagnosis not present

## 2018-01-31 DIAGNOSIS — Z86718 Personal history of other venous thrombosis and embolism: Secondary | ICD-10-CM | POA: Diagnosis not present

## 2018-01-31 DIAGNOSIS — I359 Nonrheumatic aortic valve disorder, unspecified: Secondary | ICD-10-CM | POA: Diagnosis not present

## 2018-01-31 DIAGNOSIS — C259 Malignant neoplasm of pancreas, unspecified: Secondary | ICD-10-CM | POA: Diagnosis not present

## 2018-02-01 DIAGNOSIS — I359 Nonrheumatic aortic valve disorder, unspecified: Secondary | ICD-10-CM | POA: Diagnosis not present

## 2018-02-01 DIAGNOSIS — I509 Heart failure, unspecified: Secondary | ICD-10-CM | POA: Diagnosis not present

## 2018-02-01 DIAGNOSIS — C259 Malignant neoplasm of pancreas, unspecified: Secondary | ICD-10-CM | POA: Diagnosis not present

## 2018-02-01 DIAGNOSIS — I4891 Unspecified atrial fibrillation: Secondary | ICD-10-CM | POA: Diagnosis not present

## 2018-02-01 DIAGNOSIS — I679 Cerebrovascular disease, unspecified: Secondary | ICD-10-CM | POA: Diagnosis not present

## 2018-02-01 DIAGNOSIS — Z86718 Personal history of other venous thrombosis and embolism: Secondary | ICD-10-CM | POA: Diagnosis not present

## 2018-02-04 DIAGNOSIS — I679 Cerebrovascular disease, unspecified: Secondary | ICD-10-CM | POA: Diagnosis not present

## 2018-02-04 DIAGNOSIS — C259 Malignant neoplasm of pancreas, unspecified: Secondary | ICD-10-CM | POA: Diagnosis not present

## 2018-02-04 DIAGNOSIS — I4891 Unspecified atrial fibrillation: Secondary | ICD-10-CM | POA: Diagnosis not present

## 2018-02-04 DIAGNOSIS — I359 Nonrheumatic aortic valve disorder, unspecified: Secondary | ICD-10-CM | POA: Diagnosis not present

## 2018-02-04 DIAGNOSIS — I509 Heart failure, unspecified: Secondary | ICD-10-CM | POA: Diagnosis not present

## 2018-02-04 DIAGNOSIS — Z86718 Personal history of other venous thrombosis and embolism: Secondary | ICD-10-CM | POA: Diagnosis not present

## 2018-02-05 ENCOUNTER — Telehealth: Payer: Self-pay

## 2018-02-05 DIAGNOSIS — C259 Malignant neoplasm of pancreas, unspecified: Secondary | ICD-10-CM | POA: Diagnosis not present

## 2018-02-05 DIAGNOSIS — I509 Heart failure, unspecified: Secondary | ICD-10-CM | POA: Diagnosis not present

## 2018-02-05 DIAGNOSIS — I679 Cerebrovascular disease, unspecified: Secondary | ICD-10-CM | POA: Diagnosis not present

## 2018-02-05 DIAGNOSIS — Z86718 Personal history of other venous thrombosis and embolism: Secondary | ICD-10-CM | POA: Diagnosis not present

## 2018-02-05 DIAGNOSIS — I359 Nonrheumatic aortic valve disorder, unspecified: Secondary | ICD-10-CM | POA: Diagnosis not present

## 2018-02-05 DIAGNOSIS — I4891 Unspecified atrial fibrillation: Secondary | ICD-10-CM | POA: Diagnosis not present

## 2018-02-05 NOTE — Telephone Encounter (Signed)
Maura [facility] called to advise that pt seems to be beginning transition process. She reports pt has been NPO for >48 hours, she has skin color and temp changes, is becoming incontinent and has fainted once. They are asking for permission to admit to [facility] for first available bed.   Spoke with Dr. Raliegh Ip and advised. He agrees with [facility] recommendation and approves order.   Spoke with Cristela Blue and advised. They will move pt once a bed is available. Nothing further needed at this time.

## 2018-02-07 DIAGNOSIS — I4891 Unspecified atrial fibrillation: Secondary | ICD-10-CM | POA: Diagnosis not present

## 2018-02-07 DIAGNOSIS — I509 Heart failure, unspecified: Secondary | ICD-10-CM | POA: Diagnosis not present

## 2018-02-07 DIAGNOSIS — I359 Nonrheumatic aortic valve disorder, unspecified: Secondary | ICD-10-CM | POA: Diagnosis not present

## 2018-02-07 DIAGNOSIS — I679 Cerebrovascular disease, unspecified: Secondary | ICD-10-CM | POA: Diagnosis not present

## 2018-02-07 DIAGNOSIS — C259 Malignant neoplasm of pancreas, unspecified: Secondary | ICD-10-CM | POA: Diagnosis not present

## 2018-02-07 DIAGNOSIS — Z86718 Personal history of other venous thrombosis and embolism: Secondary | ICD-10-CM | POA: Diagnosis not present

## 2018-02-08 DIAGNOSIS — D419 Neoplasm of uncertain behavior of unspecified urinary organ: Secondary | ICD-10-CM | POA: Diagnosis not present

## 2018-02-08 DIAGNOSIS — F339 Major depressive disorder, recurrent, unspecified: Secondary | ICD-10-CM | POA: Diagnosis not present

## 2018-02-08 DIAGNOSIS — I359 Nonrheumatic aortic valve disorder, unspecified: Secondary | ICD-10-CM | POA: Diagnosis not present

## 2018-02-08 DIAGNOSIS — Z86718 Personal history of other venous thrombosis and embolism: Secondary | ICD-10-CM | POA: Diagnosis not present

## 2018-02-08 DIAGNOSIS — I4891 Unspecified atrial fibrillation: Secondary | ICD-10-CM | POA: Diagnosis not present

## 2018-02-08 DIAGNOSIS — R197 Diarrhea, unspecified: Secondary | ICD-10-CM | POA: Diagnosis not present

## 2018-02-08 DIAGNOSIS — I2609 Other pulmonary embolism with acute cor pulmonale: Secondary | ICD-10-CM | POA: Diagnosis not present

## 2018-02-08 DIAGNOSIS — C259 Malignant neoplasm of pancreas, unspecified: Secondary | ICD-10-CM | POA: Diagnosis not present

## 2018-02-08 DIAGNOSIS — I679 Cerebrovascular disease, unspecified: Secondary | ICD-10-CM | POA: Diagnosis not present

## 2018-02-08 DIAGNOSIS — I509 Heart failure, unspecified: Secondary | ICD-10-CM | POA: Diagnosis not present

## 2018-02-08 DIAGNOSIS — E1159 Type 2 diabetes mellitus with other circulatory complications: Secondary | ICD-10-CM | POA: Diagnosis not present

## 2018-02-08 DIAGNOSIS — I10 Essential (primary) hypertension: Secondary | ICD-10-CM | POA: Diagnosis not present

## 2018-02-08 DIAGNOSIS — E785 Hyperlipidemia, unspecified: Secondary | ICD-10-CM | POA: Diagnosis not present

## 2018-02-08 DIAGNOSIS — K219 Gastro-esophageal reflux disease without esophagitis: Secondary | ICD-10-CM | POA: Diagnosis not present

## 2018-02-09 DIAGNOSIS — I4891 Unspecified atrial fibrillation: Secondary | ICD-10-CM | POA: Diagnosis not present

## 2018-02-09 DIAGNOSIS — C259 Malignant neoplasm of pancreas, unspecified: Secondary | ICD-10-CM | POA: Diagnosis not present

## 2018-02-09 DIAGNOSIS — I679 Cerebrovascular disease, unspecified: Secondary | ICD-10-CM | POA: Diagnosis not present

## 2018-02-09 DIAGNOSIS — Z86718 Personal history of other venous thrombosis and embolism: Secondary | ICD-10-CM | POA: Diagnosis not present

## 2018-02-09 DIAGNOSIS — I359 Nonrheumatic aortic valve disorder, unspecified: Secondary | ICD-10-CM | POA: Diagnosis not present

## 2018-02-09 DIAGNOSIS — I509 Heart failure, unspecified: Secondary | ICD-10-CM | POA: Diagnosis not present

## 2018-02-11 DIAGNOSIS — Z86718 Personal history of other venous thrombosis and embolism: Secondary | ICD-10-CM | POA: Diagnosis not present

## 2018-02-11 DIAGNOSIS — I679 Cerebrovascular disease, unspecified: Secondary | ICD-10-CM | POA: Diagnosis not present

## 2018-02-11 DIAGNOSIS — C259 Malignant neoplasm of pancreas, unspecified: Secondary | ICD-10-CM | POA: Diagnosis not present

## 2018-02-11 DIAGNOSIS — I359 Nonrheumatic aortic valve disorder, unspecified: Secondary | ICD-10-CM | POA: Diagnosis not present

## 2018-02-11 DIAGNOSIS — I4891 Unspecified atrial fibrillation: Secondary | ICD-10-CM | POA: Diagnosis not present

## 2018-02-11 DIAGNOSIS — I509 Heart failure, unspecified: Secondary | ICD-10-CM | POA: Diagnosis not present

## 2018-02-13 DIAGNOSIS — I359 Nonrheumatic aortic valve disorder, unspecified: Secondary | ICD-10-CM | POA: Diagnosis not present

## 2018-02-13 DIAGNOSIS — C259 Malignant neoplasm of pancreas, unspecified: Secondary | ICD-10-CM | POA: Diagnosis not present

## 2018-02-13 DIAGNOSIS — I4891 Unspecified atrial fibrillation: Secondary | ICD-10-CM | POA: Diagnosis not present

## 2018-02-13 DIAGNOSIS — I679 Cerebrovascular disease, unspecified: Secondary | ICD-10-CM | POA: Diagnosis not present

## 2018-02-13 DIAGNOSIS — I509 Heart failure, unspecified: Secondary | ICD-10-CM | POA: Diagnosis not present

## 2018-02-13 DIAGNOSIS — Z86718 Personal history of other venous thrombosis and embolism: Secondary | ICD-10-CM | POA: Diagnosis not present

## 2018-02-14 DIAGNOSIS — I359 Nonrheumatic aortic valve disorder, unspecified: Secondary | ICD-10-CM | POA: Diagnosis not present

## 2018-02-14 DIAGNOSIS — Z86718 Personal history of other venous thrombosis and embolism: Secondary | ICD-10-CM | POA: Diagnosis not present

## 2018-02-14 DIAGNOSIS — I4891 Unspecified atrial fibrillation: Secondary | ICD-10-CM | POA: Diagnosis not present

## 2018-02-14 DIAGNOSIS — I509 Heart failure, unspecified: Secondary | ICD-10-CM | POA: Diagnosis not present

## 2018-02-14 DIAGNOSIS — C259 Malignant neoplasm of pancreas, unspecified: Secondary | ICD-10-CM | POA: Diagnosis not present

## 2018-02-14 DIAGNOSIS — I679 Cerebrovascular disease, unspecified: Secondary | ICD-10-CM | POA: Diagnosis not present

## 2018-02-15 DIAGNOSIS — C259 Malignant neoplasm of pancreas, unspecified: Secondary | ICD-10-CM | POA: Diagnosis not present

## 2018-02-15 DIAGNOSIS — Z86718 Personal history of other venous thrombosis and embolism: Secondary | ICD-10-CM | POA: Diagnosis not present

## 2018-02-15 DIAGNOSIS — I679 Cerebrovascular disease, unspecified: Secondary | ICD-10-CM | POA: Diagnosis not present

## 2018-02-15 DIAGNOSIS — I509 Heart failure, unspecified: Secondary | ICD-10-CM | POA: Diagnosis not present

## 2018-02-15 DIAGNOSIS — I359 Nonrheumatic aortic valve disorder, unspecified: Secondary | ICD-10-CM | POA: Diagnosis not present

## 2018-02-15 DIAGNOSIS — I4891 Unspecified atrial fibrillation: Secondary | ICD-10-CM | POA: Diagnosis not present

## 2018-02-16 ENCOUNTER — Other Ambulatory Visit: Payer: Self-pay | Admitting: Internal Medicine

## 2018-02-16 DIAGNOSIS — I679 Cerebrovascular disease, unspecified: Secondary | ICD-10-CM | POA: Diagnosis not present

## 2018-02-16 DIAGNOSIS — I4891 Unspecified atrial fibrillation: Secondary | ICD-10-CM | POA: Diagnosis not present

## 2018-02-16 DIAGNOSIS — C259 Malignant neoplasm of pancreas, unspecified: Secondary | ICD-10-CM | POA: Diagnosis not present

## 2018-02-16 DIAGNOSIS — I359 Nonrheumatic aortic valve disorder, unspecified: Secondary | ICD-10-CM | POA: Diagnosis not present

## 2018-02-16 DIAGNOSIS — I509 Heart failure, unspecified: Secondary | ICD-10-CM | POA: Diagnosis not present

## 2018-02-16 DIAGNOSIS — Z86718 Personal history of other venous thrombosis and embolism: Secondary | ICD-10-CM | POA: Diagnosis not present

## 2018-02-17 DIAGNOSIS — C259 Malignant neoplasm of pancreas, unspecified: Secondary | ICD-10-CM | POA: Diagnosis not present

## 2018-02-17 DIAGNOSIS — I359 Nonrheumatic aortic valve disorder, unspecified: Secondary | ICD-10-CM | POA: Diagnosis not present

## 2018-02-17 DIAGNOSIS — I4891 Unspecified atrial fibrillation: Secondary | ICD-10-CM | POA: Diagnosis not present

## 2018-02-17 DIAGNOSIS — I509 Heart failure, unspecified: Secondary | ICD-10-CM | POA: Diagnosis not present

## 2018-02-17 DIAGNOSIS — I679 Cerebrovascular disease, unspecified: Secondary | ICD-10-CM | POA: Diagnosis not present

## 2018-02-17 DIAGNOSIS — Z86718 Personal history of other venous thrombosis and embolism: Secondary | ICD-10-CM | POA: Diagnosis not present

## 2018-02-18 DIAGNOSIS — I4891 Unspecified atrial fibrillation: Secondary | ICD-10-CM | POA: Diagnosis not present

## 2018-02-18 DIAGNOSIS — I359 Nonrheumatic aortic valve disorder, unspecified: Secondary | ICD-10-CM | POA: Diagnosis not present

## 2018-02-18 DIAGNOSIS — Z86718 Personal history of other venous thrombosis and embolism: Secondary | ICD-10-CM | POA: Diagnosis not present

## 2018-02-18 DIAGNOSIS — I509 Heart failure, unspecified: Secondary | ICD-10-CM | POA: Diagnosis not present

## 2018-02-18 DIAGNOSIS — C259 Malignant neoplasm of pancreas, unspecified: Secondary | ICD-10-CM | POA: Diagnosis not present

## 2018-02-18 DIAGNOSIS — I679 Cerebrovascular disease, unspecified: Secondary | ICD-10-CM | POA: Diagnosis not present

## 2018-02-18 NOTE — Telephone Encounter (Signed)
Please advise. Last rx was written by hospitalist for 0.5 tablets, request is for 1 tablet.  Patient is currently a resident at [facility].

## 2018-02-18 NOTE — Telephone Encounter (Signed)
Okay for refill; change prescription to amlodipine 2.5 mg 1  tablet daily

## 2018-02-19 DIAGNOSIS — I359 Nonrheumatic aortic valve disorder, unspecified: Secondary | ICD-10-CM | POA: Diagnosis not present

## 2018-02-19 DIAGNOSIS — I679 Cerebrovascular disease, unspecified: Secondary | ICD-10-CM | POA: Diagnosis not present

## 2018-02-19 DIAGNOSIS — C259 Malignant neoplasm of pancreas, unspecified: Secondary | ICD-10-CM | POA: Diagnosis not present

## 2018-02-19 DIAGNOSIS — Z86718 Personal history of other venous thrombosis and embolism: Secondary | ICD-10-CM | POA: Diagnosis not present

## 2018-02-19 DIAGNOSIS — I509 Heart failure, unspecified: Secondary | ICD-10-CM | POA: Diagnosis not present

## 2018-02-19 DIAGNOSIS — I4891 Unspecified atrial fibrillation: Secondary | ICD-10-CM | POA: Diagnosis not present

## 2018-02-20 ENCOUNTER — Telehealth: Payer: Self-pay | Admitting: *Deleted

## 2018-02-20 DIAGNOSIS — Z86718 Personal history of other venous thrombosis and embolism: Secondary | ICD-10-CM | POA: Diagnosis not present

## 2018-02-20 DIAGNOSIS — I359 Nonrheumatic aortic valve disorder, unspecified: Secondary | ICD-10-CM | POA: Diagnosis not present

## 2018-02-20 DIAGNOSIS — I679 Cerebrovascular disease, unspecified: Secondary | ICD-10-CM | POA: Diagnosis not present

## 2018-02-20 DIAGNOSIS — C259 Malignant neoplasm of pancreas, unspecified: Secondary | ICD-10-CM | POA: Diagnosis not present

## 2018-02-20 DIAGNOSIS — I4891 Unspecified atrial fibrillation: Secondary | ICD-10-CM | POA: Diagnosis not present

## 2018-02-20 DIAGNOSIS — I509 Heart failure, unspecified: Secondary | ICD-10-CM | POA: Diagnosis not present

## 2018-02-20 NOTE — Telephone Encounter (Signed)
Copied from [place] [phone]. Topic: General - Deceased Patient >> 03/10/2018 11:49 AM Conception Chancy, NT wrote: Judeen Hammans is a RN with [facility] and is calling to inform the office that the patient has passed away, today, March 10, 2018.  [phone]   Route to department's PEC Pool.  >> 03-10-2018 11:51 AM Conception Chancy, NT wrote: [facility]**

## 2018-03-11 DEATH — deceased

## 2019-09-23 IMAGING — CT CT ABD-PELV W/ CM
2 of 5 series · 16 of 46 positions shown, 18 images · IV contrast (APPLIED)
Comparison: None.

CLINICAL DATA: Chronic diarrhea for 3-4 months, hypertension,
hyperlipidemia, history of pulmonary embolism on anti coagulation,
type II diabetes mellitus, GERD, generalized weakness, some LEFT
lower quadrant tenderness

EXAM:
CT ABDOMEN AND PELVIS WITH CONTRAST
TECHNIQUE: Multidetector CT imaging of the abdomen and pelvis was performed
using the standard protocol following bolus administration of
intravenous contrast. Sagittal and coronal MPR images reconstructed
from axial data set.
CONTRAST:  100mL [id number] IOPAMIDOL ([id number]) INJECTION 61%

[Series 2: axial st · axial · 0.90mm/px · z∈[+1073,+1498]mm · 13 of 99 slices shown, 15 images]
[im 7/99  soft-tissue]
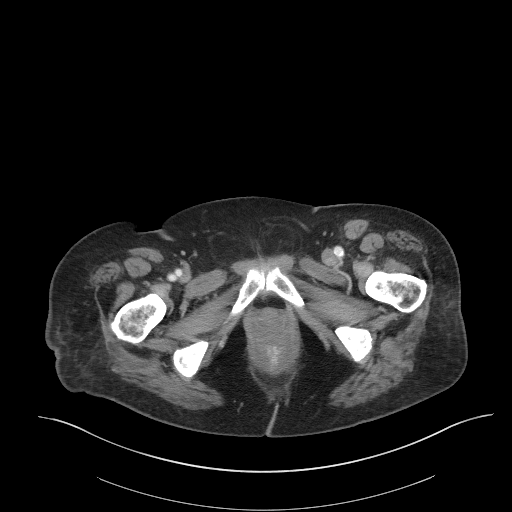
[im 7/99  bone]
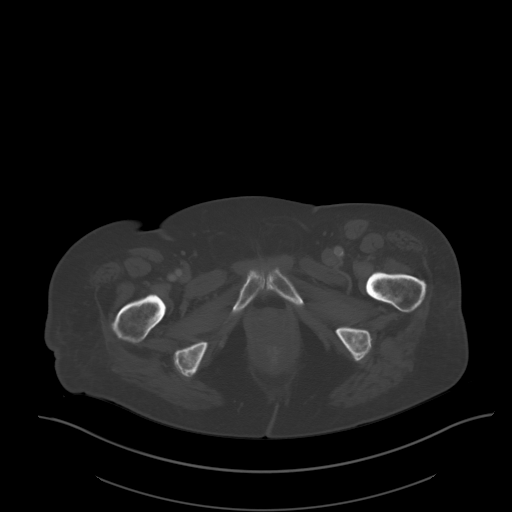
[im 14/99  soft-tissue]
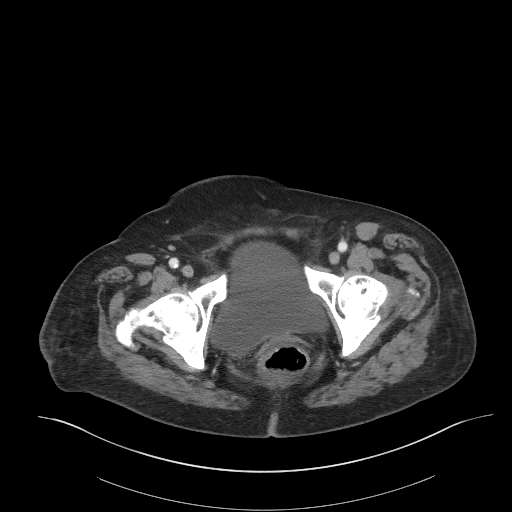
[im 20/99  soft-tissue]
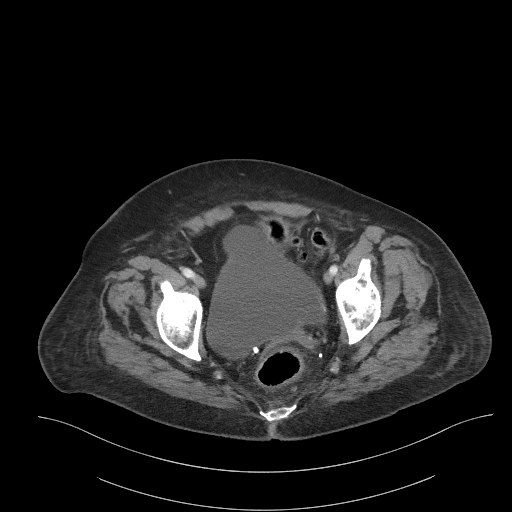
[im 27/99  soft-tissue]
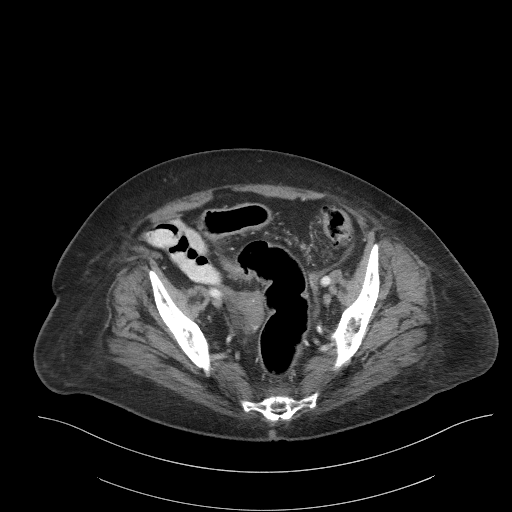
[im 33/99  soft-tissue]
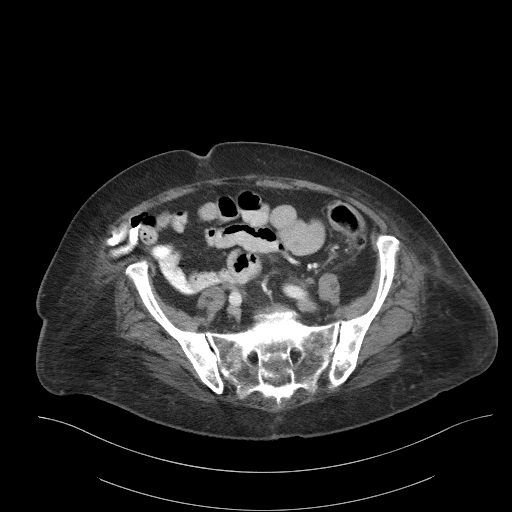
[im 40/99  soft-tissue]
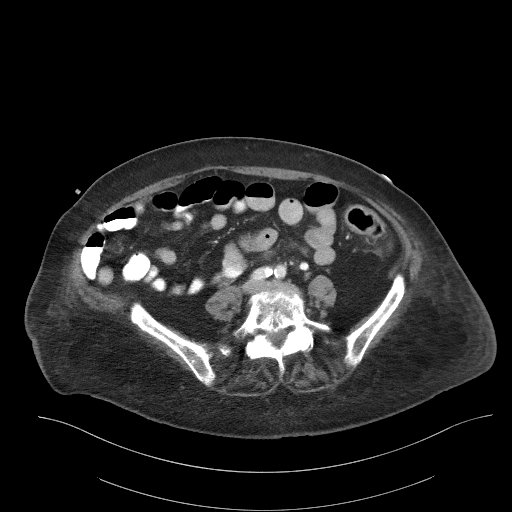
[im 53/99  soft-tissue]
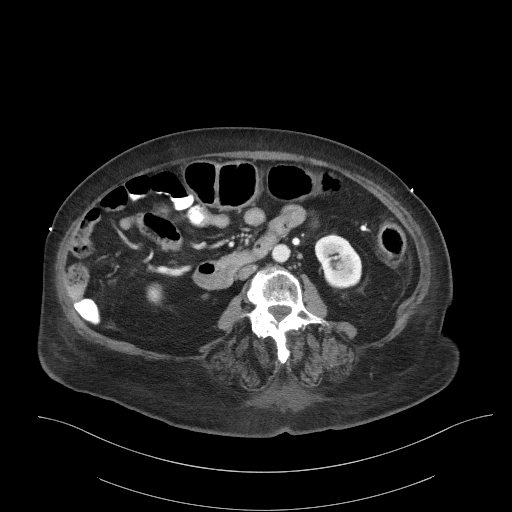
[im 59/99  soft-tissue]
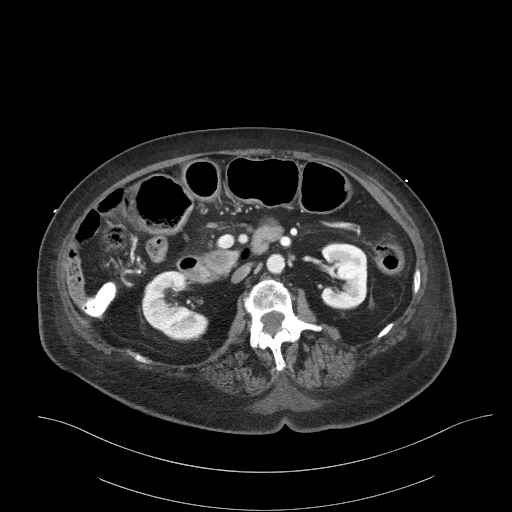
[im 66/99  soft-tissue]
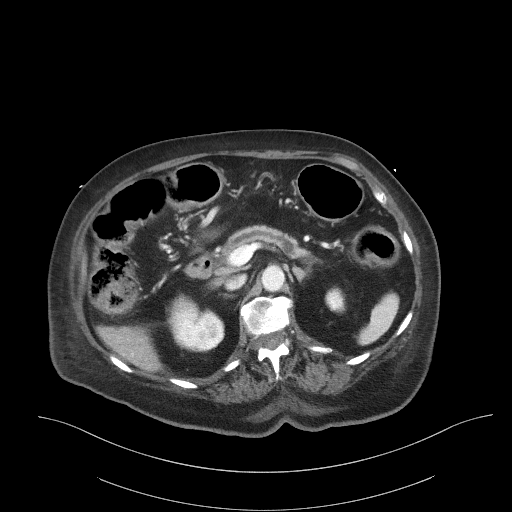
[im 66/99  bone]
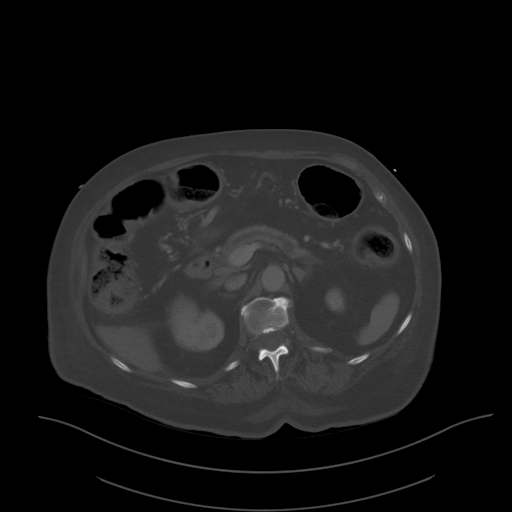
[im 72/99  soft-tissue]
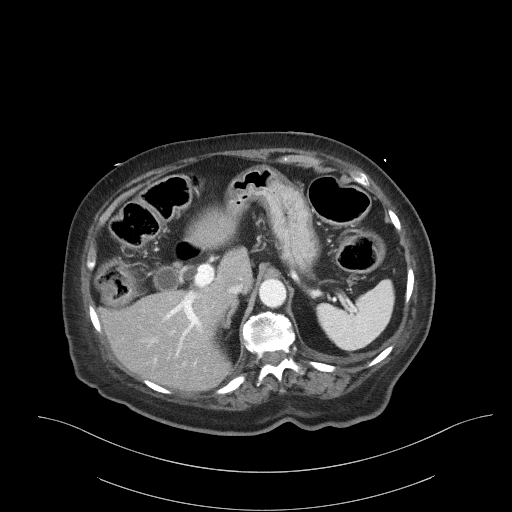
[im 79/99  soft-tissue]
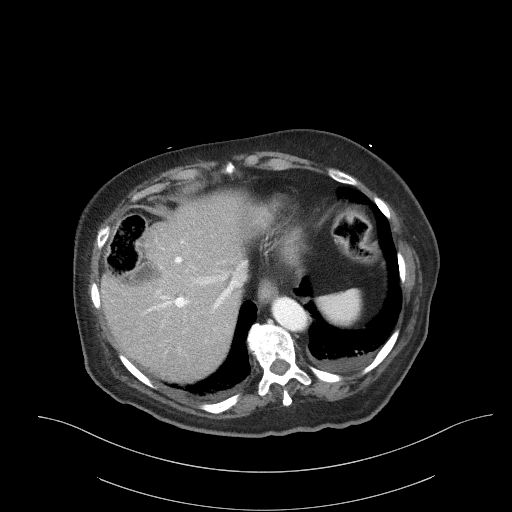
[im 85/99  soft-tissue]
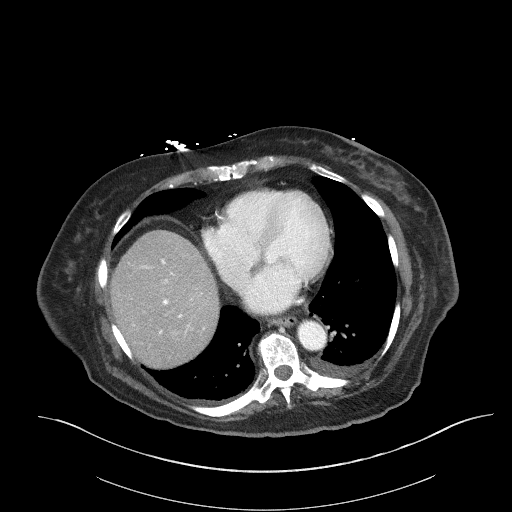
[im 92/99  soft-tissue]
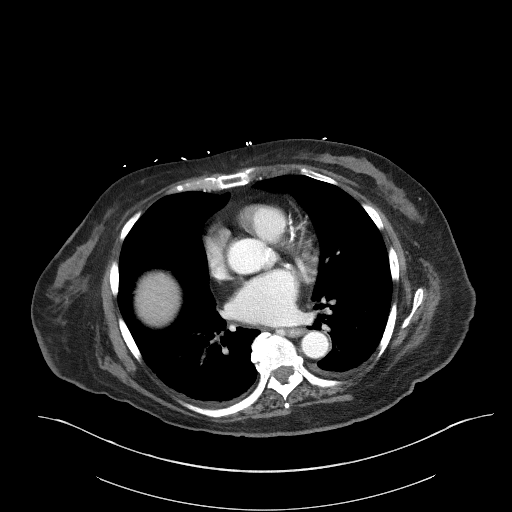

[Series 5: coronal st · coronal · 0.84mm/px · 3 of 91 slices shown]
[im 31/91  soft-tissue]
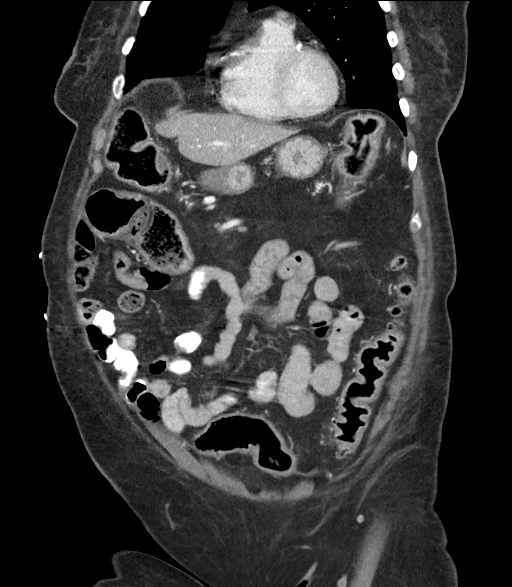
[im 41/91  soft-tissue]
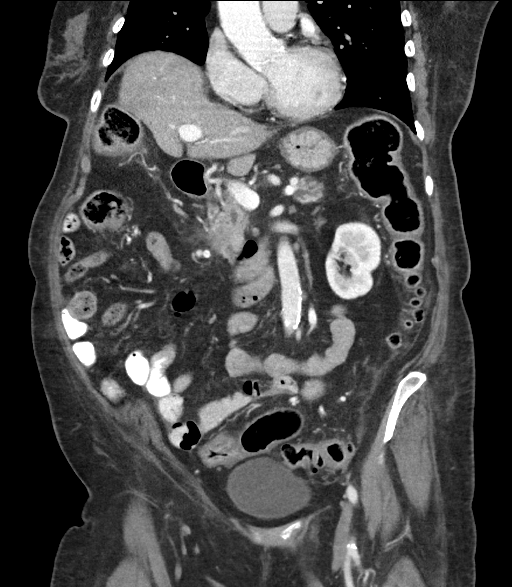
[im 51/91  soft-tissue]
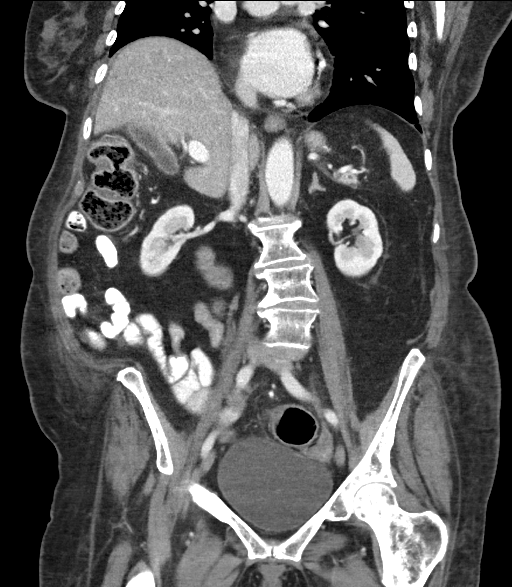

[16 of 46 positions shown; findings below may reference images not displayed]

FINDINGS: Lower chest: Tiny bibasilar pleural effusions and minimal
atelectasis

Hepatobiliary: Fatty infiltration of liver. Gallbladder
unremarkable. Tiny cyst in caudate lobe liver.

Pancreas: Atrophic pancreas with ductal dilatation. Hypoechoic
region at pancreatic head which could represent a pancreatic
neoplasm or less likely focal pancreatitis, area in question 20 x 21
x 17 mm in size.

Spleen: Normal appearance

Adrenals/Urinary Tract: Adrenal glands, kidneys, and ureters normal
appearance. Small mass with tiny calcifications at the posterior
RIGHT bladder base, question tumor, ureterocele with tiny
calcifications, partially calcified stone, area in question 13 x 7 x
15 mm in size. Remainder of bladder unremarkable.

Stomach/Bowel: Normal appendix. Diverticulosis of descending and
sigmoid colon with pericolic infiltrative changes at the descending
sigmoid junction suspicious for acute diverticulitis. Additional
portions of the colon demonstrate questionable wall thickening
versus artifact from underdistention. Small bowel loops
unremarkable. Stomach decompressed.

Vascular/Lymphatic: Atherosclerotic calcifications aorta and iliac
arteries without aneurysm. Scattered pelvic phleboliths. No
adenopathy.

Reproductive: Uterus surgically absent.  Normal sized ovaries.

Other: No free air or free fluid.  No hernia.

Musculoskeletal: Diffuse osseous demineralization scattered
degenerative disc and facet disease changes of the thoracolumbar
spine.
IMPRESSION: Distal colonic diverticulosis with suspected acute diverticulitis of
the descending sigmoid junction.

Additional scattered areas of questionable colonic wall thickening
versus artifact or distension complex not excluded.

RIGHT-side bladder base intraluminal mass measuring 13 x 7 x 15 mm,
question tumor, ureterocele, partially calcified cyst; correlation
with cystoscopy recommended.

Poor defined low-attenuation region at pancreatic head 20 x 21 x 17
mm in size, favor tumor though focal pancreatitis not excluded;
followup MR imaging abdomen with and without contrast recommended to
assess for potential pancreatic neoplasm.

Hepatic steatosis.
